# Patient Record
Sex: Female | Born: 1955 | Race: White | Hispanic: No | Smoking: Never smoker
Health system: Southern US, Community
[De-identification: ages and names within clinical notes are randomized; demographics above are authoritative.]

## PROBLEM LIST (undated history)

## (undated) DIAGNOSIS — R519 Headache, unspecified: Secondary | ICD-10-CM

## (undated) DIAGNOSIS — Z8489 Family history of other specified conditions: Secondary | ICD-10-CM

## (undated) DIAGNOSIS — K439 Ventral hernia without obstruction or gangrene: Secondary | ICD-10-CM

## (undated) DIAGNOSIS — M1990 Unspecified osteoarthritis, unspecified site: Secondary | ICD-10-CM

## (undated) DIAGNOSIS — M2550 Pain in unspecified joint: Secondary | ICD-10-CM

## (undated) DIAGNOSIS — Z9889 Other specified postprocedural states: Secondary | ICD-10-CM

## (undated) DIAGNOSIS — G4730 Sleep apnea, unspecified: Secondary | ICD-10-CM

## (undated) DIAGNOSIS — F32A Depression, unspecified: Secondary | ICD-10-CM

## (undated) DIAGNOSIS — Z803 Family history of malignant neoplasm of breast: Secondary | ICD-10-CM

## (undated) DIAGNOSIS — I10 Essential (primary) hypertension: Secondary | ICD-10-CM

## (undated) DIAGNOSIS — K219 Gastro-esophageal reflux disease without esophagitis: Secondary | ICD-10-CM

## (undated) DIAGNOSIS — F329 Major depressive disorder, single episode, unspecified: Secondary | ICD-10-CM

## (undated) DIAGNOSIS — R112 Nausea with vomiting, unspecified: Secondary | ICD-10-CM

## (undated) DIAGNOSIS — D649 Anemia, unspecified: Secondary | ICD-10-CM

## (undated) DIAGNOSIS — F419 Anxiety disorder, unspecified: Secondary | ICD-10-CM

## (undated) DIAGNOSIS — R51 Headache: Secondary | ICD-10-CM

## (undated) DIAGNOSIS — J45909 Unspecified asthma, uncomplicated: Secondary | ICD-10-CM

## (undated) DIAGNOSIS — K760 Fatty (change of) liver, not elsewhere classified: Secondary | ICD-10-CM

## (undated) DIAGNOSIS — Z91018 Allergy to other foods: Secondary | ICD-10-CM

## (undated) HISTORY — PX: VARICOSE VEIN SURGERY: SHX832

## (undated) HISTORY — PX: EYE SURGERY: SHX253

## (undated) HISTORY — DX: Unspecified asthma, uncomplicated: J45.909

## (undated) HISTORY — DX: Anxiety disorder, unspecified: F41.9

## (undated) HISTORY — PX: COLONOSCOPY W/ BIOPSIES AND POLYPECTOMY: SHX1376

## (undated) HISTORY — DX: Anemia, unspecified: D64.9

## (undated) HISTORY — DX: Depression, unspecified: F32.A

## (undated) HISTORY — DX: Family history of malignant neoplasm of breast: Z80.3

## (undated) HISTORY — DX: Allergy to other foods: Z91.018

## (undated) HISTORY — DX: Essential (primary) hypertension: I10

## (undated) HISTORY — DX: Gastro-esophageal reflux disease without esophagitis: K21.9

## (undated) HISTORY — PX: OTHER SURGICAL HISTORY: SHX169

## (undated) HISTORY — PX: DILATION AND CURETTAGE OF UTERUS: SHX78

## (undated) HISTORY — DX: Major depressive disorder, single episode, unspecified: F32.9

## (undated) HISTORY — DX: Pain in unspecified joint: M25.50

## (undated) HISTORY — PX: CHOLECYSTECTOMY: SHX55

## (undated) HISTORY — DX: Sleep apnea, unspecified: G47.30

---

## 1998-10-11 ENCOUNTER — Other Ambulatory Visit: Admission: RE | Admit: 1998-10-11 | Discharge: 1998-10-11 | Payer: Self-pay | Admitting: Obstetrics and Gynecology

## 1999-10-12 ENCOUNTER — Other Ambulatory Visit: Admission: RE | Admit: 1999-10-12 | Discharge: 1999-10-12 | Payer: Self-pay | Admitting: Obstetrics and Gynecology

## 2001-01-07 ENCOUNTER — Other Ambulatory Visit: Admission: RE | Admit: 2001-01-07 | Discharge: 2001-01-07 | Payer: Self-pay | Admitting: Obstetrics and Gynecology

## 2002-01-27 ENCOUNTER — Other Ambulatory Visit: Admission: RE | Admit: 2002-01-27 | Discharge: 2002-01-27 | Payer: Self-pay | Admitting: Obstetrics and Gynecology

## 2003-02-03 ENCOUNTER — Other Ambulatory Visit: Admission: RE | Admit: 2003-02-03 | Discharge: 2003-02-03 | Payer: Self-pay | Admitting: Obstetrics and Gynecology

## 2004-02-22 ENCOUNTER — Other Ambulatory Visit: Admission: RE | Admit: 2004-02-22 | Discharge: 2004-02-22 | Payer: Self-pay | Admitting: Obstetrics and Gynecology

## 2004-08-01 ENCOUNTER — Ambulatory Visit: Payer: Self-pay | Admitting: Internal Medicine

## 2004-08-16 ENCOUNTER — Ambulatory Visit: Payer: Self-pay | Admitting: Internal Medicine

## 2005-02-09 ENCOUNTER — Other Ambulatory Visit: Admission: RE | Admit: 2005-02-09 | Discharge: 2005-02-09 | Payer: Self-pay | Admitting: Obstetrics and Gynecology

## 2005-10-26 ENCOUNTER — Ambulatory Visit: Payer: Self-pay | Admitting: Internal Medicine

## 2006-10-26 ENCOUNTER — Ambulatory Visit: Payer: Self-pay | Admitting: Internal Medicine

## 2007-01-10 ENCOUNTER — Ambulatory Visit: Payer: Self-pay | Admitting: Otolaryngology

## 2007-08-27 ENCOUNTER — Ambulatory Visit (HOSPITAL_COMMUNITY): Admission: RE | Admit: 2007-08-27 | Discharge: 2007-08-27 | Payer: Self-pay | Admitting: Obstetrics and Gynecology

## 2007-08-27 ENCOUNTER — Encounter (INDEPENDENT_AMBULATORY_CARE_PROVIDER_SITE_OTHER): Payer: Self-pay | Admitting: Obstetrics and Gynecology

## 2007-10-23 DIAGNOSIS — J45909 Unspecified asthma, uncomplicated: Secondary | ICD-10-CM | POA: Insufficient documentation

## 2007-10-23 DIAGNOSIS — J309 Allergic rhinitis, unspecified: Secondary | ICD-10-CM | POA: Insufficient documentation

## 2007-10-23 DIAGNOSIS — Z91018 Allergy to other foods: Secondary | ICD-10-CM

## 2007-10-25 ENCOUNTER — Encounter: Payer: Self-pay | Admitting: Internal Medicine

## 2007-11-04 ENCOUNTER — Telehealth (INDEPENDENT_AMBULATORY_CARE_PROVIDER_SITE_OTHER): Payer: Self-pay | Admitting: *Deleted

## 2007-11-22 ENCOUNTER — Ambulatory Visit: Payer: Self-pay | Admitting: Internal Medicine

## 2008-07-01 ENCOUNTER — Ambulatory Visit: Payer: Self-pay

## 2009-01-21 ENCOUNTER — Ambulatory Visit (HOSPITAL_COMMUNITY): Admission: RE | Admit: 2009-01-21 | Discharge: 2009-01-21 | Payer: Self-pay | Admitting: Internal Medicine

## 2009-01-21 ENCOUNTER — Telehealth (INDEPENDENT_AMBULATORY_CARE_PROVIDER_SITE_OTHER): Payer: Self-pay | Admitting: *Deleted

## 2009-01-21 ENCOUNTER — Ambulatory Visit: Payer: Self-pay | Admitting: Internal Medicine

## 2009-04-16 ENCOUNTER — Encounter: Admission: RE | Admit: 2009-04-16 | Discharge: 2009-04-16 | Payer: Self-pay | Admitting: Specialist

## 2009-12-13 ENCOUNTER — Encounter: Admission: RE | Admit: 2009-12-13 | Discharge: 2009-12-13 | Payer: Self-pay | Admitting: Obstetrics and Gynecology

## 2010-08-14 ENCOUNTER — Encounter: Payer: Self-pay | Admitting: Obstetrics and Gynecology

## 2010-12-06 NOTE — Op Note (Signed)
NAMESERAPHIM, AFFINITO              ACCOUNT NO.:  [id number]   MEDICAL RECORD NO.:  [id number]          PATIENT TYPE:  AMB   LOCATION:  SDC                           FACILITY:  [facility]   PHYSICIAN:  Dineen Kid. Rana Snare, M.D.    DATE OF BIRTH:  February 13, 1956   DATE OF PROCEDURE:  08/27/2007  DATE OF DISCHARGE:                               OPERATIVE REPORT   PREOPERATIVE DIAGNOSIS:  Postmenopausal bleeding and endometrial mass.   POSTOPERATIVE DIAGNOSIS:  Postmenopausal bleeding and endometrial mass,  endometrial polyp.   PROCEDURE:  Hysteroscopy dilation and curettage  with polypectomy.   SURGEON:  Dr. Candice Camp.   ANESTHESIA:  General by LMA and also local by paracervical block.   INDICATIONS:  Ms. Jawad is a 55 year old postmenopausal woman by [facility].  She has had abnormal uterine bleeding.  She underwent saline infusion  ultrasound shows endometrial polyp.  She presents today for hysteroscopy  D&C for evaluation and removal of the polyp.  Risks and benefits of  procedure were discussed at length.  Informed consent was obtained.   FINDINGS AT TIME OF SURGERY:  Small endometrial polyp.  Otherwise normal-  appearing endometrial cavity and cervix.   DESCRIPTION OF PROCEDURE:  After adequate analgesia the patient placed  in the dorsal lithotomy position.  She is sterilely prepped and draped.  Bladder sterilely drained.  Graves speculum placed.  Tenaculum placed on  anterior lip of the cervix and paracervical block was placed 1%  Xylocaine with 1:100,000 epinephrine.  Uterus sounded to 8 cm, easily  dilated to #27 [facility] dilator.  Hysteroscope was inserted.  The above  findings were noted.  Polyp forceps were used to grasp the endometrial  polyp and remove, followed by sharp curettage until a gritty surface  felt throughout the endometrial cavity.  Reexamination with the  hysteroscope revealed normal appearing endometrial lining in ostia and  cervix without any residual polyps noted or other  underlying pathology  visualized.  Hysteroscope was then removed.  Tenaculum removed from the  cervix noted be hemostatic.  The patient was then transferred to  recovery room in stable condition.  Sponge instrument count was normal  x3.  Estimated blood loss minimal.  Sorbitol deficit 10 mL.  The patient  received 1 gram of cefotetan preoperatively.   DISPOSITION:  The patient discharged home to follow-up the office in 2-3  weeks, sent home with routine instruction sheet for D&C, told to return  for increased pain, fever or bleeding.      Dineen Kid Rana Snare, M.D.  Electronically Signed    DCL/MEDQ  D:  08/27/2007  T:  08/27/2007  Job:  [id number]

## 2010-12-09 NOTE — Assessment & Plan Note (Signed)
[facility]                             PULMONARY OFFICE NOTE   NAME:Hardin, Mary                       MRN:          [id number]  DATE:10/26/2006                            DOB:          [birth year]    PROBLEM:  1. Chronic asthma.  2. Allergic rhinitis.  3. 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   HISTORY:  One-year followup.  She uses Flonase and Allegra when needed.  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  She has not been wearing a mask.  We discussed this, and  talked about risk management.  I also went over use of an EpiPen with  her, and updated her prescription.  She questioned whether Advair might  keep her hoarse, and we discussed dry powder and steroid technologies.  I offered a trial of Symbicort as an alternative, since that could be  used with a spacer, but she decided to stick with the familiar product.   MEDICATIONS:  1. Advair 100/50.  2. Flonase.  3. Allegra 180.  4. Rescue albuterol inhaler, rarely needed.  5. Valtrex.  6. EpiPen.   OBJECTIVE:  Weight 305 pounds.  BP 124/82.  Pulse regular 72.  Room air  saturation 100%.  She is significantly overweight, alert, and seems comfortable today.  Conjunctivae, nasal mucosa, and chest were all clear.  HEART:  Sounds regular without murmur.   IMPRESSION:  1. Seasonal rhinitis.  2. Nonspecific dust irritant exposure.  3. Mild asthma.  4. Angioedema has not been active as long as she trigging foods.   PLAN:  1. We refilled EpiPen.  Refilled her routine medications including      Advair 100/50 and rescue      albuterol.  2. Schedule return in 1 year, earlier p.r.n.     Clinton D. Maple Hudson, MD, Tonny Bollman, FACP  Electronically Signed    CDY/MedQ  DD: 10/26/2006  DT: 10/27/2006  Job #: [id number]   cc:   Dineen Kid. Rana Snare, M.D.

## 2011-03-14 ENCOUNTER — Ambulatory Visit: Payer: Self-pay | Admitting: Family Medicine

## 2011-04-14 LAB — CBC
Hemoglobin: 12.2
RDW: 14.2

## 2012-07-04 ENCOUNTER — Other Ambulatory Visit: Payer: Self-pay | Admitting: Gastroenterology

## 2012-07-24 LAB — HM COLONOSCOPY

## 2013-02-26 ENCOUNTER — Telehealth: Payer: Self-pay | Admitting: Genetic Counselor

## 2013-02-26 NOTE — Telephone Encounter (Signed)
LVOM FOR PT TO RETURN CALL IN RE TO GENETIC APPT.  °

## 2013-02-27 ENCOUNTER — Telehealth: Payer: Self-pay | Admitting: Genetic Counselor

## 2013-02-27 NOTE — Telephone Encounter (Signed)
S/W PT IN RE TO GENETIC APPT 10/09 @ 10 W/KAREN POWELL REFERRING DR. Onalee Hua LOWE WELCOME PACKET MAILED.

## 2013-05-01 ENCOUNTER — Encounter: Payer: Self-pay | Admitting: Genetic Counselor

## 2013-05-01 ENCOUNTER — Other Ambulatory Visit: Payer: Self-pay | Admitting: Lab

## 2013-07-28 ENCOUNTER — Encounter: Payer: Self-pay | Admitting: Genetic Counselor

## 2013-07-28 ENCOUNTER — Other Ambulatory Visit: Payer: Self-pay | Admitting: Lab

## 2013-07-31 ENCOUNTER — Other Ambulatory Visit: Payer: Self-pay

## 2013-07-31 ENCOUNTER — Encounter: Payer: Self-pay | Admitting: Genetic Counselor

## 2013-08-12 DIAGNOSIS — G4730 Sleep apnea, unspecified: Secondary | ICD-10-CM | POA: Insufficient documentation

## 2013-08-12 DIAGNOSIS — K219 Gastro-esophageal reflux disease without esophagitis: Secondary | ICD-10-CM | POA: Insufficient documentation

## 2013-10-02 ENCOUNTER — Encounter: Payer: Self-pay | Admitting: Genetic Counselor

## 2013-10-02 ENCOUNTER — Ambulatory Visit (HOSPITAL_BASED_OUTPATIENT_CLINIC_OR_DEPARTMENT_OTHER): Payer: PRIVATE HEALTH INSURANCE | Admitting: Genetic Counselor

## 2013-10-02 ENCOUNTER — Other Ambulatory Visit: Payer: Self-pay

## 2013-10-02 DIAGNOSIS — IMO0002 Reserved for concepts with insufficient information to code with codable children: Secondary | ICD-10-CM

## 2013-10-02 DIAGNOSIS — Z808 Family history of malignant neoplasm of other organs or systems: Secondary | ICD-10-CM

## 2013-10-02 DIAGNOSIS — Z800 Family history of malignant neoplasm of digestive organs: Secondary | ICD-10-CM

## 2013-10-02 DIAGNOSIS — Z803 Family history of malignant neoplasm of breast: Secondary | ICD-10-CM

## 2013-10-02 NOTE — Progress Notes (Signed)
Dr.  Rana Snare, Dineen Kid, MD requested a consultation for genetic counseling and risk assessment for Mary Hardin, a 58 y.o. female, for discussion of her family history of breast, pancreatic and thyroid cancer.  She presents to clinic today to discuss the possibility of a genetic predisposition to cancer, and to further clarify her risks, as well as her family members' risks for cancer.   HISTORY OF PRESENT ILLNESS: Mary Hardin is a 58 y.o. female with no personal history of cancer.  She has had abnormal bleeding which has prompted her to have two D&C's, but has not been diagnosed with cancer.  She has had a colonoscopy in the past that was normal, an endoscopy which found gastritis, and has been diagnosed with fibrocystic breast disease.  History reviewed. No pertinent past medical history.  History reviewed. No pertinent past surgical history.  History   Social History  . Marital Status: Married    Spouse Name: N/A    Number of Children: 0  . Years of Education: N/A   Social History Main Topics  . Smoking status: Never Smoker   . Smokeless tobacco: None  . Alcohol Use: Yes     Comment: 1-4 glasses per week  . Drug Use: No  . Sexual Activity: None   Other Topics Concern  . None   Social History Narrative  . None    REPRODUCTIVE HISTORY AND PERSONAL RISK ASSESSMENT FACTORS: Menarche was at age 59.   postmenopausal Uterus Intact: yes Ovaries Intact: yes G0P0A0, first live birth at age N/A  She has not previously undergone treatment for infertility.   Oral Contraceptive use: 25+ years   She has not used HRT in the past.    FAMILY HISTORY:  We obtained a detailed, 4-generation family history.  Significant diagnoses are listed below: Family History  Problem Relation Age of Onset  . Breast cancer Maternal Grandmother 51  . Pancreatic cancer Paternal Grandmother 50  . Breast cancer Maternal Aunt 55  . Thyroid cancer Cousin     dx in her 30s    Patient's ancestors  are of unknown descent. There is no reported Ashkenazi Jewish ancestry. There is no known consanguinity.  GENETIC COUNSELING ASSESSMENT: Mary Hardin is a 58 y.o. female with a family history of breast, pancreatic and thyroid cancer which somewhat suggestive of a sporadic or familial pattern of cancer. We, therefore, discussed and recommended the following at today's visit.   DISCUSSION: We reviewed the characteristics, features and inheritance patterns of hereditary cancer syndromes. We also discussed genetic testing, including the appropriate family members to test, the process of testing, insurance coverage and turn-around-time for results. We discussed that based on her large family, and few family members with cancer over the age of 44, that the pattern in her family is more consistent with familial breast cancer rather than hereditary breast cancer.  Familial cancer is associated with older ages of onset and has a multifactorial pattern of inheritance, including genetics and environmental exposures.  At this time she does not meet the medical criteria for genetic testing based on [employer] criteria.  PLAN: After considering the risks, benefits, and limitations, Mary Hardin declined genetic testing based on not meeting the medical criteria for her insurance. We encouraged Mary Hardin to remain in contact with cancer genetics annually so that we can continuously update the family history and inform her of any changes in cancer genetics and testing that may be of benefit for her family. Mary Hardin's questions were answered  to her satisfaction today. Our contact information was provided should additional questions or concerns arise.  The patient was seen for a total of 45 minutes, greater than 50% of which was spent face-to-face counseling.  This note will also be sent to the referring provider via the electronic medical record. The patient will be supplied with a summary of this genetic  counseling discussion as well as educational information on the discussed hereditary cancer syndromes following the conclusion of their visit.   Patient was discussed with Dr. Drue SecondKalsoom Khan.   _______________________________________________________________________ For Office Staff:  Number of people involved in session: 1 Was an Intern/ student involved with case: no

## 2013-11-24 ENCOUNTER — Other Ambulatory Visit: Payer: Self-pay | Admitting: *Deleted

## 2013-11-24 ENCOUNTER — Telehealth: Payer: Self-pay | Admitting: *Deleted

## 2013-11-24 DIAGNOSIS — G5600 Carpal tunnel syndrome, unspecified upper limb: Secondary | ICD-10-CM

## 2013-11-24 NOTE — Telephone Encounter (Signed)
Office notes and referral faxed to Dr. Doonquah. Awaiting appointment. 

## 2013-12-03 NOTE — Telephone Encounter (Signed)
Patient has an appointment for 12/10/13 at 12:00 pm with Dr. Gerilyn Pilgrimoonquah. Patient aware.

## 2014-02-09 ENCOUNTER — Telehealth: Payer: Self-pay | Admitting: Genetic Counselor

## 2014-02-09 NOTE — Telephone Encounter (Signed)
pt called requesting a f/u genetics appt due to new inbformation on family history. s/w catherine and per catherine pt may not have to come in she can s/w pt over the phone. s/w pt and she is ok w/speaking to catherine over the phone. information given to catherine and if an appt  is needed catherine will let me know.

## 2014-02-14 ENCOUNTER — Telehealth: Payer: Self-pay | Admitting: Genetic Counselor

## 2014-02-14 NOTE — Telephone Encounter (Signed)
SCHEDULED PT FOR GENETICS APPT PER CATHERINE (OUTLOOK). S/W PT SHE IS AWARE OF APPTS FOR 8/6. DATE PER PT.

## 2014-02-26 ENCOUNTER — Ambulatory Visit (HOSPITAL_BASED_OUTPATIENT_CLINIC_OR_DEPARTMENT_OTHER): Payer: PRIVATE HEALTH INSURANCE | Admitting: Genetic Counselor

## 2014-02-26 ENCOUNTER — Encounter: Payer: Self-pay | Admitting: Genetic Counselor

## 2014-02-26 ENCOUNTER — Other Ambulatory Visit: Payer: PRIVATE HEALTH INSURANCE

## 2014-02-26 ENCOUNTER — Encounter (INDEPENDENT_AMBULATORY_CARE_PROVIDER_SITE_OTHER): Payer: Self-pay

## 2014-02-26 DIAGNOSIS — Z803 Family history of malignant neoplasm of breast: Secondary | ICD-10-CM | POA: Insufficient documentation

## 2014-02-26 DIAGNOSIS — IMO0002 Reserved for concepts with insufficient information to code with codable children: Secondary | ICD-10-CM

## 2014-02-26 NOTE — Progress Notes (Signed)
Patient Name: Mary Hardin Patient Age: 58 y.o. Encounter Date: 02/26/2014  Referring Physician: Candice CampLOWE, DAVID, MD  Primary Care Provider: Vonita MossRISSMAN,MARK, MD   Mary Hardin, a 58 y.o. female, is being [facility] due to a family history of breast cancer.  She presents to clinic today to discuss the possibility of a hereditary predisposition to cancer and discuss whether genetic testing is warranted. She was seen by Maylon CosKaren Powell on 10/02/13, but testing was not recommended at that time. She returns today due to additional family history.  HISTORY OF PRESENT ILLNESS: Mary Hardin has no personal history of cancer. She reports having a history of abnormal Paps and 2 D/Cs. She stated that she has a yearly mammogram, clinical breast exam and gynecologic exam. Colonoscopic screening at 51 and 56 were negative for polyps, per her report.  Past Medical History  Diagnosis Date  . Family history of malignant neoplasm of breast     History   Social History  . Marital Status: Married    Spouse Name: N/A    Number of Children: 0  . Years of Education: N/A   Social History Main Topics  . Smoking status: Never Smoker   . Smokeless tobacco: Not on file  . Alcohol Use: Yes     Comment: 1-4 glasses per week  . Drug Use: No  . Sexual Activity: Not on file   Other Topics Concern  . Not on file   Social History Narrative  . No narrative on file     FAMILY HISTORY:   During the visit, a 4-generation pedigree was obtained. Significant diagnoses include the following:  Family History  Problem Relation Age of Onset  . Breast cancer Maternal Grandmother 4958    deceased 4963  . Pancreatic cancer Paternal Grandmother 5660  . Breast cancer Maternal Aunt 55    currently 6462  . Thyroid cancer Cousin     dx in her 2940s  . Breast cancer Mother 4678    DCIS; currently 6578    Additionally, Mary Hardin has no children. Her brother (age 57) is cancer-free as are his son and  daughter. Her father died of a stroke/PE at age 57. There are no cancers reported in his six brothers or two sisters or any of their children.  Mary Hardin's ancestry is Caucasian - NOS. There is no known Jewish ancestry and no consanguinity.  ASSESSMENT AND PLAN: Mary Hardin is a 58 y.o. female with a family history of breast cancer in her mother at 6378, one of her 4 maternal aunts at 6055 and her maternal grandmother at 3858. Given her mother's age at diagnosis, this history is not highly suggestive of a hereditary predisposition to cancer. We reviewed the characteristics, features and inheritance patterns of hereditary cancer syndromes. Mary Hardin was highly motivated to get tested in order to define her own breast cancer risk more clearly. We discussed genetic testing, including the appropriate family members to test, the process of testing, insurance coverage and implications of results. A negative result will be overall reassuring, but she understood that she would still have an elevated risk of breast cancer due to her family history.  Mary Hardin wished to pursue genetic testing and a blood sample will be sent to [facility] for analysis of the 17 genes on the BreastNext gene panel. We discussed the implications of a positive, negative and/ or Variant of Uncertain Significance (VUS) result. Results should be available in approximately 4-5  weeks, at which point we will contact her and address implications for her as well as address genetic testing for at-risk family members, if needed.    We encouraged Ms. Sima to remain in contact with Cancer Genetics annually so that we can update the family history and inform her of any changes in cancer genetics and testing that may be of benefit for this family. Ms.  Mun questions were answered to her satisfaction today.   Thank you for the referral and allowing [place] to share in the care of your patient.   The patient was seen for a total of 35 minutes,  greater than 50% of which was spent face-to-face counseling. This patient was discussed with the overseeing provider who agrees with the above.   Elmer Picker, MS, CGC Certified Genetic Counseor phone: [phone] [email or web address]

## 2014-03-17 ENCOUNTER — Encounter: Payer: Self-pay | Admitting: Genetic Counselor

## 2014-03-17 NOTE — Progress Notes (Signed)
Referring Physician: Louretta Shorten, MD   [name] was called today to discuss genetic test results. Please see the Genetics note from her visit on 02/26/14 for a detailed discussion of her personal and family history.  GENETIC TESTING: At the time of [name] visit, we recommended she pursue genetic testing of multiple genes on the BreastNext gene panel. This test, which included sequencing and deletion/duplication analysis of 17 genes, was performed at [facility]. Testing was normal and did not reveal a mutation in these genes. The genes tested were ATM, BARD1, BRCA1, BRCA2, BRIP1, CDH1, CHEK2, MRE11A, MUTYH, NBN, NF1, PALB2, PTEN, RAD50, RAD51C, RAD51D, and TP53.  We discussed with [name] that since the current test is not perfect, it is possible there may be a gene mutation that current testing cannot detect, but that chance is small. We also discussed that it is possible that a different genetic factor, which was not part of this testing or has not yet been discovered, is responsible for the cancer diagnoses in the family.   CANCER SCREENING: This normal result is reassuring and indicates that [name] does not likely have an increased risk of cancer due to a mutation in one of these genes. She is aware that her risk is still elevated above baseline due to her family history. We recommended [name] continue to follow the cancer screening guidelines provided by her primary physician.   FAMILY MEMBERS: Women in the family are at some increased risk of developing breast cancer, over the general population risk, simply due to the family history. We recommended they have a yearly mammogram beginning at age 4, a yearly clinical breast exam, and perform monthly breast self-exams. A gynecologic exam is recommended yearly. Colon cancer screening is recommended to begin by age 21.  Lastly, we discussed with [name] that cancer genetics is a rapidly advancing field and it is possible  that new genetic tests will be appropriate for her in the future. We encouraged her to remain in contact with [place] on an annual basis so we can update her personal and family histories, and let her know of advances in cancer genetics that may benefit the family. Our contact number was provided. [name] questions were answered to her satisfaction today, and she knows she is welcome to call anytime with additional questions.    Mary Berg, MS, [place] Certified Genetic Counseor phone: [phone] [email or web address]

## 2014-07-08 ENCOUNTER — Other Ambulatory Visit: Payer: Self-pay | Admitting: Obstetrics and Gynecology

## 2014-07-13 LAB — CYTOLOGY - PAP

## 2014-08-03 ENCOUNTER — Encounter (INDEPENDENT_AMBULATORY_CARE_PROVIDER_SITE_OTHER): Payer: Self-pay

## 2014-08-03 ENCOUNTER — Encounter: Payer: Self-pay | Admitting: Cardiovascular Disease

## 2014-08-03 ENCOUNTER — Ambulatory Visit (INDEPENDENT_AMBULATORY_CARE_PROVIDER_SITE_OTHER): Payer: PRIVATE HEALTH INSURANCE | Admitting: Cardiovascular Disease

## 2014-08-03 VITALS — BP 128/80 | HR 67 | Ht 68.0 in | Wt 341.1 lb

## 2014-08-03 DIAGNOSIS — R000 Tachycardia, unspecified: Secondary | ICD-10-CM

## 2014-08-03 DIAGNOSIS — R0789 Other chest pain: Secondary | ICD-10-CM

## 2014-08-03 DIAGNOSIS — R079 Chest pain, unspecified: Secondary | ICD-10-CM | POA: Insufficient documentation

## 2014-08-03 NOTE — Progress Notes (Signed)
Mary Hardin Date of Birth  10-25-1955       [facility] [facility]    [place] [facility] [address], [address]  [address] [place], [place]  [address]   [place]  [id number] [phone]     [phone]   Fax  [phone]     Fax [phone]  Problem List: 1. Atypical chest pain 2. Morbid obesity 3. GERD 4. Obstructive sleep apnea - uses CPAP   History of Present Illness:  Mary Hardin is a 59 yo with hx of morbid obesity.  She reported having episodes of CP to her medical doctor. She has tachycardia whenever she walks or goes up stairs. She then developes some chest pain with the tachycardia.  Pains just last a split second  She pains are a "pins and needles" or stabbing like pain.  Also has GERD symptoms. Has DOE but never dyspnea at rest.  No PND or orthopnea Wears her CPAP at night for her OSA.   Does not work outside the house. Just joined the gym. 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      Current Outpatient Prescriptions  Medication Sig Dispense Refill  . escitalopram (LEXAPRO) 10 MG tablet Take 10 mg by mouth daily.    . Fluticasone-Salmeterol (ADVAIR DISKUS) 100-50 MCG/DOSE AEPB Inhale into the lungs as needed.    . pantoprazole (PROTONIX) 40 MG tablet Take 40 mg by mouth as needed.     No current facility-administered medications for this visit.      Allergies  Allergen Reactions  . Shellfish-Derived Products     swelling    Past Medical History  Diagnosis Date  . Family history of malignant neoplasm of breast     No past surgical history on file.  History  Smoking status  . Never Smoker   Smokeless tobacco  . Not on file    History  Alcohol Use  . Yes    Comment: 1-4 glasses per week    Family History  Problem Relation Age of Onset  . Breast cancer Maternal Grandmother 3458    deceased 6363  . Pancreatic cancer Paternal Grandmother 5160  . Breast cancer Maternal Aunt 55    currently  6762  . Thyroid cancer Cousin     dx in her 7440s  . Breast cancer Mother 878    DCIS; currently 5178    Reviw of Systems:  Reviewed in the HPI.  All other systems are negative.  Physical Exam: Blood pressure 128/80, pulse 67, height 5\' 8"  (1.727 m), weight 341 lb 1.9 oz (154.731 kg). Wt Readings from Last 3 Encounters:  08/03/14 341 lb 1.9 oz (154.731 kg)  01/21/09 329 lb 4 oz (149.347 kg)  11/22/07 324 lb (146.965 kg)     General: Well developed, well nourished, in no acute distress.  Head: Normocephalic, atraumatic, sclera non-icteric, mucus membranes are moist,   Neck: Supple. Carotids are 2 + without bruits. No JVD  Lungs: Clear   Heart: RR, normal S1S2  Abdomen: Soft, non-tender, non-distended with normal bowel sounds.  Msk:  Strength and tone are normal   Extremities: No clubbing or cyanosis. No edema.  Distal pedal pulses are 2+ and equal    Neuro: CN II - XII intact.  Alert and oriented X 3.   Psych:  Normal   ECG: Jan. 11, 2016:  NSR at 2567.  1st degree AV block .   Assessment / Plan:

## 2014-08-03 NOTE — Patient Instructions (Signed)
Your physician recommends that you schedule a follow-up appointment in:  As needed  Your physician recommends that you continue on your current medications as directed. Please refer to the Current Medication list given to you today.  

## 2014-08-03 NOTE — Assessment & Plan Note (Signed)
Mary Hardin presents with exertional palpitations and very atypical CP. Her symptoms do not sound like angina. I suspect these are all due to her morbid obesity. We talked quite a bit about her size and the importance of weight loss.   I do not think she needs to see me regularly but I would be happy to see her on an as needed basis.

## 2015-03-01 ENCOUNTER — Ambulatory Visit: Payer: Self-pay | Admitting: Family Medicine

## 2015-05-04 ENCOUNTER — Ambulatory Visit: Payer: Self-pay | Admitting: Family Medicine

## 2015-05-06 ENCOUNTER — Ambulatory Visit: Payer: Self-pay | Admitting: Family Medicine

## 2015-05-10 DIAGNOSIS — G4733 Obstructive sleep apnea (adult) (pediatric): Secondary | ICD-10-CM | POA: Insufficient documentation

## 2015-05-10 DIAGNOSIS — F32A Depression, unspecified: Secondary | ICD-10-CM

## 2015-05-10 DIAGNOSIS — F329 Major depressive disorder, single episode, unspecified: Secondary | ICD-10-CM

## 2015-05-10 DIAGNOSIS — E669 Obesity, unspecified: Secondary | ICD-10-CM

## 2015-05-10 DIAGNOSIS — F339 Major depressive disorder, recurrent, unspecified: Secondary | ICD-10-CM | POA: Insufficient documentation

## 2015-05-10 DIAGNOSIS — Z6841 Body Mass Index (BMI) 40.0 and over, adult: Secondary | ICD-10-CM | POA: Insufficient documentation

## 2015-05-10 DIAGNOSIS — I10 Essential (primary) hypertension: Secondary | ICD-10-CM | POA: Insufficient documentation

## 2015-05-11 ENCOUNTER — Encounter: Payer: Self-pay | Admitting: Family Medicine

## 2015-05-11 ENCOUNTER — Ambulatory Visit (INDEPENDENT_AMBULATORY_CARE_PROVIDER_SITE_OTHER): Payer: PRIVATE HEALTH INSURANCE | Admitting: Family Medicine

## 2015-05-11 VITALS — BP 137/81 | HR 64 | Temp 98.6°F | Ht 67.3 in | Wt 348.0 lb

## 2015-05-11 DIAGNOSIS — K219 Gastro-esophageal reflux disease without esophagitis: Secondary | ICD-10-CM | POA: Diagnosis not present

## 2015-05-11 DIAGNOSIS — R0789 Other chest pain: Secondary | ICD-10-CM | POA: Diagnosis not present

## 2015-05-11 DIAGNOSIS — F329 Major depressive disorder, single episode, unspecified: Secondary | ICD-10-CM

## 2015-05-11 DIAGNOSIS — F419 Anxiety disorder, unspecified: Secondary | ICD-10-CM

## 2015-05-11 DIAGNOSIS — Z23 Encounter for immunization: Secondary | ICD-10-CM | POA: Diagnosis not present

## 2015-05-11 DIAGNOSIS — F32A Depression, unspecified: Secondary | ICD-10-CM

## 2015-05-11 DIAGNOSIS — G4730 Sleep apnea, unspecified: Secondary | ICD-10-CM

## 2015-05-11 MED ORDER — LORAZEPAM 1 MG PO TABS: 1.0000 mg | ORAL_TABLET | Freq: Every day | ORAL | Status: DC | PRN

## 2015-05-11 MED ORDER — ESCITALOPRAM OXALATE 20 MG PO TABS: 20.0000 mg | ORAL_TABLET | Freq: Every day | ORAL | Status: DC

## 2015-05-11 NOTE — Assessment & Plan Note (Signed)
Controlled with diet 

## 2015-05-11 NOTE — Assessment & Plan Note (Signed)
The current medical regimen is effective;  continue present plan and medications.  

## 2015-05-11 NOTE — Assessment & Plan Note (Signed)
Per anxiety will give lorazepam 1 mg to take 1/2-1 tablet when necessary anxiety patient should last a long time.

## 2015-05-11 NOTE — Progress Notes (Signed)
BP 137/81 mmHg  Pulse 64  Temp(Src) 98.6 F (37 C)  Ht 5' 7.3" (1.709 m)  Wt 348 lb (157.852 kg)  BMI 54.05 kg/m2  SpO2 98%   Subjective:    Patient ID: Mary Hardin, female    DOB: 1955-12-18, 59 y.o.   MRN: [id number]  HPI: Mary Hardin is a 59 y.o. female  Chief Complaint  Patient presents with  . Hypertension  . Depression   Patient follow-up on depression doing well with Lexapro effects and wants to continue as doing very well. Patient also has had occasional panic attacks has some old lorazepam from 2013 Wants a refill to have for rare use.  Blood pressure doing well not taking any medicine Not taking any reflux medicines   Relevant past medical, surgical, family and social history reviewed and updated as indicated. Interim medical history since our last visit reviewed. Allergies and medications reviewed and updated.  Review of Systems  Constitutional: Negative.   Respiratory: Negative.   Cardiovascular: Negative.     Per HPI unless specifically indicated above     Objective:    BP 137/81 mmHg  Pulse 64  Temp(Src) 98.6 F (37 C)  Ht 5' 7.3" (1.709 m)  Wt 348 lb (157.852 kg)  BMI 54.05 kg/m2  SpO2 98%  Wt Readings from Last 3 Encounters:  05/11/15 348 lb (157.852 kg)  11/02/14 337 lb (152.862 kg)  08/03/14 341 lb 1.9 oz (154.731 kg)    Physical Exam  Constitutional: She is oriented to person, place, and time. She appears well-developed and well-nourished. No distress.  HENT:  Head: Normocephalic and atraumatic.  Right Ear: Hearing normal.  Left Ear: Hearing normal.  Nose: Nose normal.  Eyes: Conjunctivae and lids are normal. Right eye exhibits no discharge. Left eye exhibits no discharge. No scleral icterus.  Cardiovascular: Normal rate, regular rhythm and normal heart sounds.   Pulmonary/Chest: Effort normal and breath sounds normal. No respiratory distress.  Musculoskeletal: Normal range of motion.  Neurological: She is alert and  oriented to person, place, and time.  Skin: Skin is intact. No rash noted.  Psychiatric: She has a normal mood and affect. Her speech is normal and behavior is normal. Judgment and thought content normal. Cognition and memory are normal.    Results for orders placed or performed in visit on 05/10/15  HM COLONOSCOPY  Result Value Ref Range   HM Colonoscopy from [facility]       Assessment & Plan:   Problem List Items Addressed This Visit      Digestive   Acid reflux    Controlled with diet        Other   Chest pain   Relevant Medications   escitalopram (LEXAPRO) 20 MG tablet   Apnea, sleep    Using CPAP faithfully      Chronic depression    The current medical regimen is effective;  continue present plan and medications.       Relevant Medications   LORazepam (ATIVAN) 1 MG tablet   escitalopram (LEXAPRO) 20 MG tablet   Anxiety    Per anxiety will give lorazepam 1 mg to take 1/2-1 tablet when necessary anxiety patient should last a long time.      Relevant Medications   LORazepam (ATIVAN) 1 MG tablet   escitalopram (LEXAPRO) 20 MG tablet    Other Visit Diagnoses    Immunization due    -  Primary    Relevant Orders  Flu Vaccine QUAD 36+ mos PF IM (Fluarix & Fluzone Quad PF) (Completed)    Tdap vaccine greater than or equal to 7yo IM (Completed)        Follow up plan: Return in about 6 months (around 11/09/2015), or if symptoms worsen or fail to improve, for Physical Exam.

## 2015-05-11 NOTE — Assessment & Plan Note (Signed)
Using CPAP faithfully

## 2015-11-09 ENCOUNTER — Encounter: Payer: Self-pay | Admitting: Family Medicine

## 2015-11-09 ENCOUNTER — Ambulatory Visit (INDEPENDENT_AMBULATORY_CARE_PROVIDER_SITE_OTHER): Payer: PRIVATE HEALTH INSURANCE | Admitting: Family Medicine

## 2015-11-09 VITALS — BP 124/80 | HR 58 | Temp 98.3°F | Ht 68.3 in | Wt 349.0 lb

## 2015-11-09 DIAGNOSIS — Z0000 Encounter for general adult medical examination without abnormal findings: Secondary | ICD-10-CM

## 2015-11-09 DIAGNOSIS — G4733 Obstructive sleep apnea (adult) (pediatric): Secondary | ICD-10-CM | POA: Diagnosis not present

## 2015-11-09 DIAGNOSIS — F329 Major depressive disorder, single episode, unspecified: Secondary | ICD-10-CM | POA: Diagnosis not present

## 2015-11-09 DIAGNOSIS — E669 Obesity, unspecified: Secondary | ICD-10-CM | POA: Diagnosis not present

## 2015-11-09 DIAGNOSIS — F32A Depression, unspecified: Secondary | ICD-10-CM

## 2015-11-09 DIAGNOSIS — F419 Anxiety disorder, unspecified: Secondary | ICD-10-CM

## 2015-11-09 DIAGNOSIS — J4520 Mild intermittent asthma, uncomplicated: Secondary | ICD-10-CM

## 2015-11-09 DIAGNOSIS — Z113 Encounter for screening for infections with a predominantly sexual mode of transmission: Secondary | ICD-10-CM

## 2015-11-09 DIAGNOSIS — K219 Gastro-esophageal reflux disease without esophagitis: Secondary | ICD-10-CM

## 2015-11-09 LAB — URINALYSIS, ROUTINE W REFLEX MICROSCOPIC
Bilirubin, UA: NEGATIVE
GLUCOSE, UA: NEGATIVE
KETONES UA: NEGATIVE
LEUKOCYTES UA: NEGATIVE
Nitrite, UA: NEGATIVE
Protein, UA: NEGATIVE
RBC, UA: NEGATIVE
Specific Gravity, UA: 1.01 (ref 1.005–1.030)
UUROB: 0.2 mg/dL (ref 0.2–1.0)
pH, UA: 7.5 (ref 5.0–7.5)

## 2015-11-09 NOTE — Assessment & Plan Note (Signed)
The current medical regimen is effective;  continue present plan and medications.  

## 2015-11-09 NOTE — Assessment & Plan Note (Signed)
discussed diet exercise wt loss

## 2015-11-09 NOTE — Assessment & Plan Note (Signed)
Uses occ lorezapam

## 2015-11-09 NOTE — Progress Notes (Signed)
BP 124/80 mmHg  Pulse 58  Temp(Src) 98.3 F (36.8 C)  Ht 5' 8.3" (1.735 m)  Wt 349 lb (158.305 kg)  BMI 52.59 kg/m2  SpO2 96%   Subjective:    Patient ID: Mary Hardin, female    DOB: 10-25-1955, 60 y.o.   MRN: [id number]  HPI: Mary Hardin is a 60 y.o. female  Chief Complaint  Patient presents with  . Annual Exam  Patient all in all doing well. Taking Lexapro mostly 10 mg taken without problems takes rare lorazepam when flying or for stormy days still has original bottle has not needed Advair or Flonase but has on standby. Really does well with her CPAP machine which enables good sleep and keep her congestion clear. Patient has continued problems with weight.  Relevant past medical, surgical, family and social history reviewed and updated as indicated. Interim medical history since our last visit reviewed. Allergies and medications reviewed and updated.  Review of Systems  Constitutional: Negative.   HENT: Negative.   Eyes: Negative.   Respiratory: Negative.   Cardiovascular: Negative.   Gastrointestinal: Negative.   Endocrine: Negative.   Genitourinary: Negative.   Musculoskeletal: Negative.   Skin: Negative.   Allergic/Immunologic: Negative.   Neurological: Negative.   Hematological: Negative.   Psychiatric/Behavioral: Negative.     Per HPI unless specifically indicated above     Objective:    BP 124/80 mmHg  Pulse 58  Temp(Src) 98.3 F (36.8 C)  Ht 5' 8.3" (1.735 m)  Wt 349 lb (158.305 kg)  BMI 52.59 kg/m2  SpO2 96%  Wt Readings from Last 3 Encounters:  11/09/15 349 lb (158.305 kg)  05/11/15 348 lb (157.852 kg)  11/02/14 337 lb (152.862 kg)    Physical Exam  Constitutional: She is oriented to person, place, and time. She appears well-developed and well-nourished.  HENT:  Head: Normocephalic and atraumatic.  Right Ear: External ear normal.  Left Ear: External ear normal.  Nose: Nose normal.  Mouth/Throat: Oropharynx is clear and moist.   Eyes: Conjunctivae and EOM are normal. Pupils are equal, round, and reactive to light.  Neck: Normal range of motion. Neck supple. Carotid bruit is not present.  Cardiovascular: Normal rate, regular rhythm and normal heart sounds.   No murmur heard. Pulmonary/Chest: Effort normal and breath sounds normal.  Breast exam done at GYN and normal  Abdominal: Soft. Bowel sounds are normal. There is no hepatosplenomegaly.  Genitourinary:  Pap smear done at GYN and reported as normal.  Musculoskeletal: Normal range of motion.  Neurological: She is alert and oriented to person, place, and time.  Skin: No rash noted.  Psychiatric: She has a normal mood and affect. Her behavior is normal. Judgment and thought content normal.    Results for orders placed or performed in visit on 05/10/15  HM COLONOSCOPY  Result Value Ref Range   HM Colonoscopy from [facility]       Assessment & Plan:   Problem List Items Addressed This Visit      Respiratory   Asthma    Stable with PRN meds      OSA (obstructive sleep apnea)    Controlled with using CPAP nightly        Digestive   Acid reflux    Discussed PRN meds        Other   Chronic depression    The current medical regimen is effective;  continue present plan and medications.       Obesity  discussed diet exercise wt loss      Anxiety    Uses occ lorezapam       Other Visit Diagnoses    Routine general medical examination at a health care facility    -  Primary    Relevant Orders    CBC with Differential/Platelet    Comprehensive metabolic panel    Lipid Panel w/o Chol/HDL Ratio    TSH    Urinalysis, Routine w reflex microscopic ([facility])    Routine screening for STI (sexually transmitted infection)        Relevant Orders    Hepatitis C Antibody        Follow up plan: Return in about 6 months (around 05/10/2016) for med check.

## 2015-11-09 NOTE — Assessment & Plan Note (Signed)
Controlled with using CPAP nightly

## 2015-11-09 NOTE — Assessment & Plan Note (Signed)
Discussed PRN meds

## 2015-11-09 NOTE — Assessment & Plan Note (Signed)
Stable with PRN meds

## 2015-11-09 NOTE — Addendum Note (Signed)
Addended by: Lurlean HornsWILSON, NANCY H on: 11/09/2015 09:26 AM   Modules accepted: [employer]

## 2015-11-10 ENCOUNTER — Encounter: Payer: Self-pay | Admitting: Family Medicine

## 2015-11-10 LAB — CBC WITH DIFFERENTIAL/PLATELET
BASOS: 1 %
Basophils Absolute: 0 10*3/uL (ref 0.0–0.2)
EOS (ABSOLUTE): 0.2 10*3/uL (ref 0.0–0.4)
EOS: 2 %
HEMATOCRIT: 34.5 % (ref 34.0–46.6)
Hemoglobin: 11.1 g/dL (ref 11.1–15.9)
Immature Grans (Abs): 0 10*3/uL (ref 0.0–0.1)
Immature Granulocytes: 0 %
LYMPHS ABS: 1.7 10*3/uL (ref 0.7–3.1)
Lymphs: 26 %
MCH: 26.7 pg (ref 26.6–33.0)
MCHC: 32.2 g/dL (ref 31.5–35.7)
MCV: 83 fL (ref 79–97)
MONOS ABS: 0.4 10*3/uL (ref 0.1–0.9)
Monocytes: 6 %
Neutrophils Absolute: 4.1 10*3/uL (ref 1.4–7.0)
Neutrophils: 65 %
Platelets: 272 10*3/uL (ref 150–379)
RBC: 4.16 x10E6/uL (ref 3.77–5.28)
RDW: 15.4 % (ref 12.3–15.4)
WBC: 6.4 10*3/uL (ref 3.4–10.8)

## 2015-11-10 LAB — COMPREHENSIVE METABOLIC PANEL
A/G RATIO: 1.4 (ref 1.2–2.2)
ALK PHOS: 71 IU/L (ref 39–117)
ALT: 11 IU/L (ref 0–32)
AST: 18 IU/L (ref 0–40)
Albumin: 4.1 g/dL (ref 3.5–5.5)
BILIRUBIN TOTAL: 0.4 mg/dL (ref 0.0–1.2)
BUN/Creatinine Ratio: 12 (ref 9–23)
BUN: 10 mg/dL (ref 6–24)
CO2: 22 mmol/L (ref 18–29)
Calcium: 9.2 mg/dL (ref 8.7–10.2)
Chloride: 103 mmol/L (ref 96–106)
Creatinine, Ser: 0.81 mg/dL (ref 0.57–1.00)
GFR calc Af Amer: 93 mL/min/{1.73_m2} (ref >59)
GFR calc non Af Amer: 80 mL/min/{1.73_m2} (ref >59)
GLOBULIN, TOTAL: 2.9 g/dL (ref 1.5–4.5)
Glucose: 97 mg/dL (ref 65–99)
POTASSIUM: 4.4 mmol/L (ref 3.5–5.2)
SODIUM: 141 mmol/L (ref 134–144)
Total Protein: 7 g/dL (ref 6.0–8.5)

## 2015-11-10 LAB — LIPID PANEL W/O CHOL/HDL RATIO
CHOLESTEROL TOTAL: 148 mg/dL (ref 100–199)
HDL: 72 mg/dL (ref >39)
LDL Calculated: 60 mg/dL (ref 0–99)
TRIGLYCERIDES: 78 mg/dL (ref 0–149)
VLDL Cholesterol Cal: 16 mg/dL (ref 5–40)

## 2015-11-10 LAB — TSH: TSH: 3.28 u[IU]/mL (ref 0.450–4.500)

## 2015-11-10 LAB — HEPATITIS C ANTIBODY: Hep C Virus Ab: <0.1 s/co ratio (ref 0.0–0.9)

## 2016-03-24 ENCOUNTER — Telehealth: Payer: Self-pay | Admitting: *Deleted

## 2016-03-24 NOTE — Telephone Encounter (Signed)
Opened in error

## 2016-03-31 ENCOUNTER — Telehealth: Payer: PRIVATE HEALTH INSURANCE | Admitting: Family

## 2016-03-31 ENCOUNTER — Other Ambulatory Visit: Payer: Self-pay | Admitting: Family Medicine

## 2016-03-31 DIAGNOSIS — H60332 Swimmer's ear, left ear: Secondary | ICD-10-CM

## 2016-03-31 DIAGNOSIS — H6092 Unspecified otitis externa, left ear: Secondary | ICD-10-CM

## 2016-03-31 MED ORDER — CIPROFLOXACIN-HYDROCORTISONE 0.2-1 % OT SUSP: 3.0000 [drp] | Freq: Two times a day (BID) | OTIC | 0 refills | Status: DC

## 2016-03-31 NOTE — Progress Notes (Signed)
E Visit for Swimmer's Ear  We are sorry that you are not feeling well. Here is how we plan to help!  I have prescribed: Ciprofloxin 0.2% and hydrocortisone 1% otic suspension 3 drops in affected ears twice daily until completed   In certain cases swimmer's ear may progress to a more serious bacterial infection of the middle or inner ear.  If you have a fever 102 and up and significantly worsening symptoms, this could indicate a more serious infection moving to the middle/inner and needs face to face evaluation in an office by a provider.  Your symptoms should improve over the next 3 days and should resolve in about 7 days.  HOME CARE:   Wash your hands frequently.  Do not place the tip of the bottle on your ear or touch it with your fingers.  You can take Acetominophen 650 mg every 4-6 hours as needed for pain.  If pain is severe or moderate, you can apply a heating pad (set on low) or hot water bottle (wrapped in a towel) to outer ear for 20 minutes.  This will also increase drainage.  Avoid ear plugs  Do not use Q-tips  After showers, help the water run out by tilting your head to one side.  GET HELP RIGHT AWAY IF:   Fever is over 102.2 degrees.  You develop progressive ear pain or hearing loss.  Ear symptoms persist longer than 3 days after treatment.  MAKE SURE YOU:   Understand these instructions.  Will watch your condition.  Will get help right away if you are not doing well or get worse.  TO PREVENT SWIMMER'S EAR:  Use a bathing cap or custom fitted swim molds to keep your ears dry.  Towel off after swimming to dry your ears.  Tilt your head or pull your earlobes to allow the water to escape your ear canal.  If there is still water in your ears, consider using a hairdryer on the lowest setting.  Thank you for choosing an e-visit. Your e-visit answers were reviewed by a board certified advanced clinical practitioner to complete your personal care plan.  Depending upon the condition, your plan could have included both over the counter or prescription medications. Please review your pharmacy choice. Be sure that the pharmacy you have chosen is open so that you can pick up your prescription now.  If there is a problem you may message your provider in MyChart to have the prescription routed to another pharmacy. Your safety is important to us. If you have drug allergies check your prescription carefully.  For the next 24 hours, you can use MyChart to ask questions about today's visit, request a non-urgent call back, or ask for a work or school excuse from your e-visit provider. You will get an email in the next two days asking about your experience. I hope that your e-visit has been valuable and will speed your recovery.      

## 2016-05-16 ENCOUNTER — Encounter: Payer: Self-pay | Admitting: Family Medicine

## 2016-05-16 ENCOUNTER — Ambulatory Visit (INDEPENDENT_AMBULATORY_CARE_PROVIDER_SITE_OTHER): Payer: PRIVATE HEALTH INSURANCE | Admitting: Family Medicine

## 2016-05-16 ENCOUNTER — Telehealth: Payer: Self-pay | Admitting: Family Medicine

## 2016-05-16 VITALS — BP 131/77 | HR 69 | Temp 97.7°F | Wt 355.0 lb

## 2016-05-16 DIAGNOSIS — F419 Anxiety disorder, unspecified: Secondary | ICD-10-CM | POA: Diagnosis not present

## 2016-05-16 DIAGNOSIS — R002 Palpitations: Secondary | ICD-10-CM

## 2016-05-16 DIAGNOSIS — Z6841 Body Mass Index (BMI) 40.0 and over, adult: Secondary | ICD-10-CM

## 2016-05-16 DIAGNOSIS — Z23 Encounter for immunization: Secondary | ICD-10-CM | POA: Diagnosis not present

## 2016-05-16 MED ORDER — LORAZEPAM 1 MG PO TABS: 1.0000 mg | ORAL_TABLET | Freq: Every day | ORAL | 0 refills | Status: DC | PRN

## 2016-05-16 NOTE — Telephone Encounter (Signed)
Pt was seen this morning. Called and stated she is feeling dizzy and light headed. Pt stated she was feeling dizzy during check out and asked if staff had any candy or anything. Staff provided candy for the patient. Please call pt to follow up. Thanks.

## 2016-05-16 NOTE — Assessment & Plan Note (Signed)
Patient still has some lorazepam left from last year will give fresh prescription cautions.

## 2016-05-16 NOTE — Progress Notes (Signed)
BP 131/77   Pulse 69   Temp 97.7 F (36.5 C)   Wt (!) 355 lb (161 kg)   SpO2 99%   BMI 53.50 kg/m    Subjective:    Patient ID: Mary Hardin, female    DOB: 11/23/1955, 60 y.o.   MRN: [id number]  HPI: Mary Hardin is a 60 y.o. female  Chief Complaint  Patient presents with  . Anxiety    doing well  . Depression  Patient follow-up depression doing well with Lexapro takes without side effects or problems. Anxiety is stable.  Patient is concerned has family history of atrial fibrillation has racing heartbeat intermittently from time to time has been to cardiology and was diagnosed with symptoms being caused by being overweight with no real cardiovascular issues. Symptoms have persisted though with Benadryl to become on predictably with racing heartbeat with avoiding urination then has rapid heartbeats for for about 2 minutes.   Relevant past medical, surgical, family and social history reviewed and updated as indicated. Interim medical history since our last visit reviewed. Allergies and medications reviewed and updated.  Review of Systems  Constitutional: Negative.   Respiratory: Negative.   Cardiovascular: Positive for palpitations. Negative for chest pain and leg swelling.    Per HPI unless specifically indicated above     Objective:    BP 131/77   Pulse 69   Temp 97.7 F (36.5 C)   Wt (!) 355 lb (161 kg)   SpO2 99%   BMI 53.50 kg/m   Wt Readings from Last 3 Encounters:  05/16/16 (!) 355 lb (161 kg)  11/09/15 (!) 349 lb (158.3 kg)  05/11/15 (!) 348 lb (157.9 kg)    Physical Exam  Constitutional: She is oriented to person, place, and time. She appears well-developed and well-nourished. No distress.  HENT:  Head: Normocephalic and atraumatic.  Right Ear: Hearing normal.  Left Ear: Hearing normal.  Nose: Nose normal.  Eyes: Conjunctivae and lids are normal. Right eye exhibits no discharge. Left eye exhibits no discharge. No scleral icterus.    Pulmonary/Chest: Effort normal. No respiratory distress.  Musculoskeletal: Normal range of motion.  Neurological: She is alert and oriented to person, place, and time.  Skin: Skin is intact. No rash noted.  Psychiatric: She has a normal mood and affect. Her speech is normal and behavior is normal. Judgment and thought content normal. Cognition and memory are normal.    Results for orders placed or performed in visit on 11/09/15  Hepatitis C Antibody  Result Value Ref Range   Hep C Virus Ab <0.1 0.0 - 0.9 s/co ratio  CBC with Differential/Platelet  Result Value Ref Range   WBC 6.4 3.4 - 10.8 x10E3/uL   RBC 4.16 3.77 - 5.28 x10E6/uL   Hemoglobin 11.1 11.1 - 15.9 g/dL   Hematocrit 57.8 46.9 - 46.6 %   MCV 83 79 - 97 fL   MCH 26.7 26.6 - 33.0 pg   MCHC 32.2 31.5 - 35.7 g/dL   RDW 62.9 52.8 - 41.3 %   Platelets 272 150 - 379 x10E3/uL   Neutrophils 65 %   Lymphs 26 %   Monocytes 6 %   Eos 2 %   Basos 1 %   Neutrophils Absolute 4.1 1.4 - 7.0 x10E3/uL   Lymphocytes Absolute 1.7 0.7 - 3.1 x10E3/uL   Monocytes Absolute 0.4 0.1 - 0.9 x10E3/uL   EOS (ABSOLUTE) 0.2 0.0 - 0.4 x10E3/uL   Basophils Absolute 0.0 0.0 - 0.2  x10E3/uL   Immature Granulocytes 0 %   Immature Grans (Abs) 0.0 0.0 - 0.1 x10E3/uL  Comprehensive metabolic panel  Result Value Ref Range   Glucose 97 65 - 99 mg/dL   BUN 10 6 - 24 mg/dL   Creatinine, Ser 1.610.81 0.57 - 1.00 mg/dL   GFR calc non Af Amer 80 >59 mL/min/1.73   GFR calc Af Amer 93 >59 mL/min/1.73   BUN/Creatinine Ratio 12 9 - 23   Sodium 141 134 - 144 mmol/L   Potassium 4.4 3.5 - 5.2 mmol/L   Chloride 103 96 - 106 mmol/L   CO2 22 18 - 29 mmol/L   Calcium 9.2 8.7 - 10.2 mg/dL   Total Protein 7.0 6.0 - 8.5 g/dL   Albumin 4.1 3.5 - 5.5 g/dL   Globulin, Total 2.9 1.5 - 4.5 g/dL   Albumin/Globulin Ratio 1.4 1.2 - 2.2   Bilirubin Total 0.4 0.0 - 1.2 mg/dL   Alkaline Phosphatase 71 39 - 117 IU/L   AST 18 0 - 40 IU/L   ALT 11 0 - 32 IU/L  Lipid Panel w/o  Chol/HDL Ratio  Result Value Ref Range   Cholesterol, Total 148 100 - 199 mg/dL   Triglycerides 78 0 - 149 mg/dL   HDL 72 >09>39 mg/dL   VLDL Cholesterol Cal 16 5 - 40 mg/dL   LDL Calculated 60 0 - 99 mg/dL  TSH  Result Value Ref Range   TSH 3.280 0.450 - 4.500 uIU/mL  Urinalysis, Routine w reflex microscopic (not at [facility])  Result Value Ref Range   Specific Gravity, UA 1.010 1.005 - 1.030   pH, UA 7.5 5.0 - 7.5   Color, UA Yellow Yellow   Appearance Ur Clear Clear   Leukocytes, UA Negative Negative   Protein, UA Negative Negative/Trace   Glucose, UA Negative Negative   Ketones, UA Negative Negative   RBC, UA Negative Negative   Bilirubin, UA Negative Negative   Urobilinogen, Ur 0.2 0.2 - 1.0 mg/dL   Nitrite, UA Negative Negative      Assessment & Plan:   Problem List Items Addressed This Visit      Other   BMI 50.0-59.9, adult (HCC)    Diet wt loss      Anxiety    Patient still has some lorazepam left from last year will give fresh prescription cautions.      Relevant Medications   LORazepam (ATIVAN) 1 MG tablet   Palpitations    EKG normal will schedule 24 hour EKG and patient will try to cause palpitations.      Relevant Orders   EKG 12-Lead (Completed)    Other Visit Diagnoses    Needs flu shot    -  Primary   Encounter for immunization       Relevant Orders   Flu Vaccine QUAD 36+ mos IM (Completed)       Follow up plan: Return in about 6 months (around 11/14/2016) for PE blood work no pe med check.

## 2016-05-16 NOTE — Telephone Encounter (Signed)
Call pt 

## 2016-05-16 NOTE — Assessment & Plan Note (Signed)
EKG normal will schedule 24 hour EKG and patient will try to cause palpitations.

## 2016-05-16 NOTE — Telephone Encounter (Signed)
Phone call Discussed with patient still little lightheaded will observe symptoms and follow-up if problems persist.

## 2016-05-16 NOTE — Assessment & Plan Note (Signed)
Diet wt loss 

## 2016-05-16 NOTE — Telephone Encounter (Signed)
Routing to provider for advice.

## 2016-05-25 ENCOUNTER — Telehealth: Payer: Self-pay | Admitting: Family Medicine

## 2016-05-25 NOTE — Telephone Encounter (Signed)
Pt called would like to know the status of the order for a heart monitor she was supposed to wear at home. Please follow up with patient as soon as possible. Thanks.

## 2016-05-26 NOTE — Telephone Encounter (Signed)
Left message to call. [employer] [facility] [phone]

## 2016-05-26 NOTE — Telephone Encounter (Signed)
Patient given the number to contact [facility]ulatory monitoring.

## 2016-05-29 ENCOUNTER — Telehealth: Payer: Self-pay

## 2016-05-29 NOTE — Telephone Encounter (Signed)
Patient called and stated that she had called [facility] to schedule her holter monitor and they had not called her back. She wanted to know if I could call them.

## 2016-05-29 NOTE — Telephone Encounter (Signed)
I spoke with Tiffany, she asked that I refax the patients order and stated that she would call the patient now and get her scheduled.

## 2016-05-30 NOTE — Telephone Encounter (Signed)
I spoke with patient; they got her scheduled.

## 2016-06-13 ENCOUNTER — Telehealth: Payer: Self-pay

## 2016-06-13 DIAGNOSIS — R002 Palpitations: Secondary | ICD-10-CM

## 2016-06-13 DIAGNOSIS — I493 Ventricular premature depolarization: Secondary | ICD-10-CM

## 2016-06-13 NOTE — Telephone Encounter (Signed)
Call re: 24 hr ekg. Left message to call.

## 2016-06-13 NOTE — Telephone Encounter (Signed)
Left message to call.

## 2016-06-14 DIAGNOSIS — I493 Ventricular premature depolarization: Secondary | ICD-10-CM | POA: Insufficient documentation

## 2016-06-14 NOTE — Telephone Encounter (Signed)
Phone call Discussed with patient 24 hour EKG showing PVCs and a run of PVCs. An occasional pause. Will refer to cardiology to further evaluate.

## 2016-06-14 NOTE — Telephone Encounter (Signed)
Called and left a message on both contact numbers for the patient, asking her to return our call.

## 2016-06-20 ENCOUNTER — Encounter: Payer: Self-pay | Admitting: Family Medicine

## 2016-07-21 ENCOUNTER — Institutional Professional Consult (permissible substitution): Payer: PRIVATE HEALTH INSURANCE | Admitting: Cardiology

## 2016-08-01 ENCOUNTER — Institutional Professional Consult (permissible substitution): Payer: PRIVATE HEALTH INSURANCE | Admitting: Cardiology

## 2016-08-08 ENCOUNTER — Telehealth: Payer: Self-pay | Admitting: Cardiology

## 2016-08-08 ENCOUNTER — Encounter: Payer: Self-pay | Admitting: Cardiology

## 2016-08-08 ENCOUNTER — Ambulatory Visit (INDEPENDENT_AMBULATORY_CARE_PROVIDER_SITE_OTHER): Payer: PRIVATE HEALTH INSURANCE | Admitting: Cardiology

## 2016-08-08 VITALS — BP 108/80 | HR 59 | Ht 68.0 in | Wt 353.5 lb

## 2016-08-08 DIAGNOSIS — E6609 Other obesity due to excess calories: Secondary | ICD-10-CM | POA: Diagnosis not present

## 2016-08-08 DIAGNOSIS — R9431 Abnormal electrocardiogram [ECG] [EKG]: Secondary | ICD-10-CM

## 2016-08-08 DIAGNOSIS — IMO0001 Reserved for inherently not codable concepts without codable children: Secondary | ICD-10-CM

## 2016-08-08 DIAGNOSIS — Z6841 Body Mass Index (BMI) 40.0 and over, adult: Secondary | ICD-10-CM

## 2016-08-08 DIAGNOSIS — I493 Ventricular premature depolarization: Secondary | ICD-10-CM

## 2016-08-08 DIAGNOSIS — R002 Palpitations: Secondary | ICD-10-CM | POA: Diagnosis not present

## 2016-08-08 NOTE — Telephone Encounter (Signed)
Left message on pt cell VM that [facility], arrival 9:45am.  Requested CB to confirm receipt of message.

## 2016-08-08 NOTE — Telephone Encounter (Signed)
Pt states she can not make [facility]. Left message for scheduling to call back.

## 2016-08-08 NOTE — Telephone Encounter (Signed)
Echo [facility] January [facility] Pt agreeable.

## 2016-08-08 NOTE — Patient Instructions (Addendum)
Medication Instructions:  Your physician recommends that you continue on your current medications as directed. Please refer to the Current Medication list given to you today.   Labwork: none  Testing/Procedures: Your physician has requested that you have an echocardi[facility] at [facility]. Echocardiography is a painless test that uses sound waves to create images of your heart. It provides your doctor with information about the size and shape of your heart and how well your heart's chambers and valves are working. This procedure takes approximately one hour. There are no restrictions for this procedure.  We will call you with a date and time.   Follow-Up: Your physician recommends that you schedule a follow-up appointment as needed with Dr. Alvino ChapelIngal.    Any Other Special Instructions Will Be Listed Below (If Applicable).     If you need a refill on your cardiac medications before your next appointment, please call your pharmacy.  .Echocardiogram An echocardiogram, or echocardiography, uses sound waves (ultrasound) to produce an image of your heart. The echocardiogram is simple, painless, obtained within a short period of time, and offers valuable information to your health care provider. The images from an echocardiogram can provide information such as:  Evidence of coronary artery disease (CAD).  Heart size.  Heart muscle function.  Heart valve function.  Aneurysm detection.  Evidence of a past heart attack.  Fluid buildup around the heart.  Heart muscle thickening.  Assess heart valve function. Tell a health care provider about:  Any allergies you have.  All medicines you are taking, including vitamins, herbs, eye drops, creams, and over-the-counter medicines.  Any problems you or family members have had with anesthetic medicines.  Any blood disorders you have.  Any surgeries you have had.  Any medical conditions you have.  Whether you are pregnant or may be  pregnant. What happens before the procedure? No special preparation is needed. Eat and drink normally. What happens during the procedure?  In order to produce an image of your heart, gel will be applied to your chest and a wand-like tool (transducer) will be moved over your chest. The gel will help transmit the sound waves from the transducer. The sound waves will harmlessly bounce off your heart to allow the heart images to be captured in real-time motion. These images will then be recorded.  You may need an IV to receive a medicine that improves the quality of the pictures. What happens after the procedure? You may return to your normal schedule including diet, activities, and medicines, unless your health care provider tells you otherwise. This information is not intended to replace advice given to you by your health care provider. Make sure you discuss any questions you have with your health care provider. Document Released: 07/07/2000 Document Revised: 02/26/2016 Document Reviewed: 03/17/2013 Elsevier Interactive Patient Education  2017 [employer]

## 2016-08-08 NOTE — Progress Notes (Signed)
Cardiology Office Note   Date:  08/08/2016   ID:  Marielys, Trinidad [birth year], MRN [id number]  Referring Doctor:  Vonita Moss, MD   Cardiologist:   Almond Lint, MD   Reason for consultation:  Chief Complaint  Patient presents with  . other    Ref by Dr. Vonita Moss for PVC's & follow up 24 hour holter monitor. Pt. saw Dr. Elease Hashimoto in [place] office in 07/2014.  Meds reviewed by the pt. verbally.       History of Present Illness: Mary Hardin is a 61 y.o. female who presents for Palpitations, PVCs, findings on Holter monitor  Patient presented to PCP with complaints of feeling her heart racing. This has been going on for a few months. Symptoms were mild to moderate intensity, lasting a few seconds or so, occurs with exertion at times, randomly occurring. She reports associated chest discomfort when she starts feeling the heart racing. She was also having some shortness of breath.  Recently, she was diagnosed to have anemia. Since she started iron supplementation, the palpitations, chest pain, and shortness of breath actually improved significantly. She has lost 10 pounds and she continues to feel better overall.  Currently no chest pain, shortness breath is improved from before, no PND, orthopnea, edema, loss of consciousness   ROS:  Please see the history of present illness. Aside from mentioned under HPI, all other systems are reviewed and negative.     Past Medical History:  Diagnosis Date  . Anemia   . Anxiety   . Family history of malignant neoplasm of breast   . GERD (gastroesophageal reflux disease)   . Hypertension     Past Surgical History:  Procedure Laterality Date  . CHOLECYSTECTOMY    . DILATION AND CURETTAGE OF UTERUS     x2     reports that she has never smoked. She has never used smokeless tobacco. She reports that she drinks alcohol. She reports that she does not use drugs.   family history includes Arrhythmia in her mother; Breast cancer  (age of onset: 52) in her maternal aunt; Breast cancer (age of onset: 42) in her maternal grandmother; Breast cancer (age of onset: 46) in her mother; Diabetes in her maternal grandmother; Hyperlipidemia in her father; Hypertension in her mother; Pancreatic cancer (age of onset: 48) in her paternal grandmother; Seizures in her father; Stroke in her father; Thyroid cancer in her cousin.   Outpatient Medications Prior to Visit  Medication Sig Dispense Refill  . escitalopram (LEXAPRO) 20 MG tablet Take 1 tablet (20 mg total) by mouth daily. 30 tablet 12  . mupirocin ointment (BACTROBAN) 2 % APPLY TO AFFECTED AREAS 3 TIMES DAILY 22 g 0  . fluticasone (FLONASE) 50 MCG/ACT nasal spray Place 2 sprays into both nostrils daily as needed for allergies or rhinitis.    . Fluticasone-Salmeterol (ADVAIR DISKUS) 100-50 MCG/DOSE AEPB Inhale into the lungs as needed.    Marland Kitchen LORazepam (ATIVAN) 1 MG tablet Take 1 tablet (1 mg total) by mouth daily as needed for anxiety. (Patient not taking: Reported on 08/08/2016) 30 tablet 0   No facility-administered medications prior to visit.      Allergies: Shellfish-derived products    PHYSICAL EXAM: VS:  BP 108/80 (BP Location: Right Wrist, Patient Position: Sitting, Cuff Size: Normal)   Pulse (!) 59   Ht 5\' 8"  (1.727 m)   Wt (!) 353 lb 8 oz (160.3 kg)   BMI 53.75 kg/m  , Body  mass index is 53.75 kg/m. Wt Readings from Last 3 Encounters:  08/08/16 (!) 353 lb 8 oz (160.3 kg)  05/16/16 (!) 355 lb (161 kg)  11/09/15 (!) 349 lb (158.3 kg)    GENERAL:  well developed, well nourished, Morbidly obese, not in acute distress HEENT: normocephalic, pink conjunctivae, anicteric sclerae, no xanthelasma, normal dentition, oropharynx clear NECK:  no neck vein engorgement, JVP normal, no hepatojugular reflux, carotid upstroke brisk and symmetric, no bruit, no thyromegaly, no lymphadenopathy LUNGS:  good respiratory effort, clear to auscultation bilaterally CV:  PMI not displaced,  no thrills, no lifts, S1 and S2 within normal limits, no palpable S3 or S4, no murmurs, no rubs, no gallops ABD:  Soft, nontender, nondistended, normoactive bowel sounds, no abdominal aortic bruit, no hepatomegaly, no splenomegaly MS: nontender back, no kyphosis, no scoliosis, no joint deformities EXT:  2+ DP/PT pulses, no edema, no varicosities, no cyanosis, no clubbing SKIN: warm, nondiaphoretic, normal turgor, no ulcers NEUROPSYCH: alert, oriented to person, place, and time, sensory/motor grossly intact, normal mood, appropriate affect  Recent Labs: 11/09/2015: ALT 11; BUN 10; Creatinine, Ser 0.81; Platelets 272; Potassium 4.4; Sodium 141; TSH 3.280   Lipid Panel    Component Value Date/Time   CHOL 148 11/09/2015 0842   TRIG 78 11/09/2015 0842   HDL 72 11/09/2015 0842   LDLCALC 60 11/09/2015 0842     Other studies Reviewed:  EKG:  The ekg from 08/08/2016 was personally reviewed by me and it revealed sinus rhythm, PR of 224 ms, heart rate 60 bpm, possible LVH.  Additional studies/ records that were reviewed personally reviewed by me today include:  Holter report 06/06/2016: This was personally reviewed by me and it showed: Sinus rhythm. Heart rate ranged from 50-119 bpm, average of 66 BPM. ventricular ectopy was less than 1% of the total number of beats: included 1345 isolated PVCs, 8 ventricular couplets, and one ventricular run. This was at 12:08 AM consisting of 6 beats of PVCs. No high grade supraventricular ectopy: Single PACs, atrial pairs. No documented atrial fibrillation.  ASSESSMENT AND PLAN: First-degree AV block Possible LVH Abn EKG Palpitations Holter showed PVCs but not anywhere close to 10% of the total number of beats. A 6 beat run of VT occurred around 12 midnight, patient asleep. Likely related to sleep apnea. No high grade supraventricular ectopy. No A. fib noted. Patient reports that since she started treatment for anemia, she has noted improvement of  palpitations along with other symptoms of chest pain and shortness of breath. Recommend echocardiogram for now.  Obesity Patient advised to continue to monitor herself for symptoms of chest discomfort or shortness of breath with exercise. She would like to continue exercising and dieting to continue to lose weight. Chest lost 10 pounds so far.  Current medicines are reviewed at length with the patient today.  The patient does not have concerns regarding medicines.  Labs/ tests ordered today include: Orders Placed This Encounter  Procedures  . EKG 12-Lead    I had a lengthy and detailed discussion with the patient regarding diagnoses, prognosis, diagnostic options.  I counseled the patient on importance of lifestyle modification including heart healthy diet, regular physical activity .   Disposition:   FU with undersigned after tests prn  Signed, Almond LintAileen Katrece Roediger, MD  08/08/2016 10:54 AM    [facility]  This note was generated in part with voice recognition software and I apologize for any typographical errors that were not detected and corrected.

## 2016-08-16 ENCOUNTER — Ambulatory Visit: Payer: PRIVATE HEALTH INSURANCE

## 2016-08-18 ENCOUNTER — Ambulatory Visit
Admission: RE | Admit: 2016-08-18 | Discharge: 2016-08-18 | Disposition: A | Discharge status: Home / Self Care | Payer: PRIVATE HEALTH INSURANCE | Source: Ambulatory Visit | Attending: Cardiology | Admitting: Cardiology

## 2016-08-18 DIAGNOSIS — I10 Essential (primary) hypertension: Secondary | ICD-10-CM | POA: Diagnosis not present

## 2016-08-18 DIAGNOSIS — D649 Anemia, unspecified: Secondary | ICD-10-CM | POA: Diagnosis not present

## 2016-08-18 DIAGNOSIS — K219 Gastro-esophageal reflux disease without esophagitis: Secondary | ICD-10-CM | POA: Insufficient documentation

## 2016-08-18 DIAGNOSIS — F419 Anxiety disorder, unspecified: Secondary | ICD-10-CM | POA: Insufficient documentation

## 2016-08-18 DIAGNOSIS — I493 Ventricular premature depolarization: Secondary | ICD-10-CM | POA: Insufficient documentation

## 2016-08-18 NOTE — Progress Notes (Signed)
*  PRELIMINARY RESULTS* Echocardiogram 2D Echocardiogram has been performed.  Cristela BlueHege, Denario Bagot 08/18/2016, 11:02 AM

## 2016-11-14 ENCOUNTER — Ambulatory Visit: Payer: PRIVATE HEALTH INSURANCE | Admitting: Family Medicine

## 2016-11-28 ENCOUNTER — Encounter: Payer: Self-pay | Admitting: Family Medicine

## 2016-11-28 ENCOUNTER — Ambulatory Visit (INDEPENDENT_AMBULATORY_CARE_PROVIDER_SITE_OTHER): Payer: PRIVATE HEALTH INSURANCE | Admitting: Family Medicine

## 2016-11-28 VITALS — BP 138/76 | HR 72 | Ht 67.72 in | Wt 356.0 lb

## 2016-11-28 DIAGNOSIS — R0789 Other chest pain: Secondary | ICD-10-CM

## 2016-11-28 DIAGNOSIS — I10 Essential (primary) hypertension: Secondary | ICD-10-CM

## 2016-11-28 DIAGNOSIS — Z1329 Encounter for screening for other suspected endocrine disorder: Secondary | ICD-10-CM | POA: Diagnosis not present

## 2016-11-28 DIAGNOSIS — Z6841 Body Mass Index (BMI) 40.0 and over, adult: Secondary | ICD-10-CM

## 2016-11-28 DIAGNOSIS — F329 Major depressive disorder, single episode, unspecified: Secondary | ICD-10-CM

## 2016-11-28 DIAGNOSIS — Z131 Encounter for screening for diabetes mellitus: Secondary | ICD-10-CM

## 2016-11-28 DIAGNOSIS — F32A Depression, unspecified: Secondary | ICD-10-CM

## 2016-11-28 DIAGNOSIS — Z13220 Encounter for screening for lipoid disorders: Secondary | ICD-10-CM | POA: Diagnosis not present

## 2016-11-28 LAB — URINALYSIS, ROUTINE W REFLEX MICROSCOPIC
Bilirubin, UA: NEGATIVE
GLUCOSE, UA: NEGATIVE
Ketones, UA: NEGATIVE
LEUKOCYTES UA: NEGATIVE
Nitrite, UA: NEGATIVE
Protein, UA: NEGATIVE
RBC, UA: NEGATIVE
SPEC GRAV UA: 1.01 (ref 1.005–1.030)
Urobilinogen, Ur: 0.2 mg/dL (ref 0.2–1.0)
pH, UA: 7 (ref 5.0–7.5)

## 2016-11-28 LAB — MICROSCOPIC EXAMINATION
Bacteria, UA: NONE SEEN
RBC, UA: NONE SEEN /hpf (ref 0–?)
WBC, UA: NONE SEEN /hpf (ref 0–?)

## 2016-11-28 MED ORDER — EPINEPHRINE 0.3 MG/0.3ML IJ SOAJ: 0.3000 mg | Freq: Once | INTRAMUSCULAR | 2 refills | Status: AC

## 2016-11-28 MED ORDER — ALBUTEROL SULFATE HFA 108 (90 BASE) MCG/ACT IN AERS: 2.0000 | INHALATION_SPRAY | Freq: Four times a day (QID) | RESPIRATORY_TRACT | 0 refills | Status: DC | PRN

## 2016-11-28 MED ORDER — ESCITALOPRAM OXALATE 20 MG PO TABS: 20.0000 mg | ORAL_TABLET | Freq: Every day | ORAL | 12 refills | Status: DC

## 2016-11-28 MED ORDER — FLUTICASONE-SALMETEROL 100-50 MCG/DOSE IN AEPB: 1.0000 | INHALATION_SPRAY | RESPIRATORY_TRACT | 12 refills | Status: DC | PRN

## 2016-11-28 MED ORDER — NAPROXEN 500 MG PO TABS: 500.0000 mg | ORAL_TABLET | Freq: Two times a day (BID) | ORAL | 4 refills | Status: DC

## 2016-11-28 NOTE — Assessment & Plan Note (Signed)
Patient doing well with Lexapro. Uses rare lorazepam still has some leftover will call and needs for stormy date use.

## 2016-11-28 NOTE — Progress Notes (Signed)
BP 138/76 (BP Location: Left Arm)   Pulse 72   Ht 5' 7.72" (1.72 m)   Wt (!) 356 lb (161.5 kg)   SpO2 99%   BMI 54.58 kg/m    Subjective:    Patient ID: Mary Hardin, female    DOB: 11/16/1955, 61 y.o.   MRN: [id number]  HPI: Mary Hardin is a 61 y.o. female  Chief Complaint  Patient presents with  . Follow-up  . Hypertension  Annual exam  Patient with a lot of arthritis complaints ankles especially knees neck active Naprosyn helps wants a refill for that has used some occasional Voltaren gel but Naprosyn works better Blood pressures been doing well. Patient's been struggling with weight and vacation. Nexium doing well for reflux. Lexapro doing well for nerves.  Relevant past medical, surgical, family and social history reviewed and updated as indicated. Interim medical history since our last visit reviewed. Allergies and medications reviewed and updated.  Review of Systems  Constitutional: Negative.   HENT: Negative.   Eyes: Negative.   Respiratory: Negative.   Cardiovascular: Negative.   Gastrointestinal: Negative.   Endocrine: Negative.   Genitourinary: Negative.   Musculoskeletal: Negative.   Skin: Negative.   Allergic/Immunologic: Negative.   Neurological: Negative.   Hematological: Negative.   Psychiatric/Behavioral: Negative.     Per HPI unless specifically indicated above     Objective:    BP 138/76 (BP Location: Left Arm)   Pulse 72   Ht 5' 7.72" (1.72 m)   Wt (!) 356 lb (161.5 kg)   SpO2 99%   BMI 54.58 kg/m   Wt Readings from Last 3 Encounters:  11/28/16 (!) 356 lb (161.5 kg)  08/08/16 (!) 353 lb 8 oz (160.3 kg)  05/16/16 (!) 355 lb (161 kg)    Physical Exam  Constitutional: She is oriented to person, place, and time. She appears well-developed and well-nourished.  HENT:  Head: Normocephalic and atraumatic.  Eyes: Conjunctivae and EOM are normal.  Neck: Normal range of motion.  Cardiovascular: Normal rate, regular rhythm and  normal heart sounds.   Pulmonary/Chest: Effort normal and breath sounds normal.  Musculoskeletal: Normal range of motion.  Neurological: She is alert and oriented to person, place, and time.  Skin: No erythema.  Psychiatric: She has a normal mood and affect. Her behavior is normal. Judgment and thought content normal.    Results for orders placed or performed in visit on 11/09/15  Hepatitis C Antibody  Result Value Ref Range   Hep C Virus Ab <0.1 0.0 - 0.9 s/co ratio  CBC with Differential/Platelet  Result Value Ref Range   WBC 6.4 3.4 - 10.8 x10E3/uL   RBC 4.16 3.77 - 5.28 x10E6/uL   Hemoglobin 11.1 11.1 - 15.9 g/dL   Hematocrit 40.934.5 81.134.0 - 46.6 %   MCV 83 79 - 97 fL   MCH 26.7 26.6 - 33.0 pg   MCHC 32.2 31.5 - 35.7 g/dL   RDW 91.415.4 78.212.3 - 95.615.4 %   Platelets 272 150 - 379 x10E3/uL   Neutrophils 65 %   Lymphs 26 %   Monocytes 6 %   Eos 2 %   Basos 1 %   Neutrophils Absolute 4.1 1.4 - 7.0 x10E3/uL   Lymphocytes Absolute 1.7 0.7 - 3.1 x10E3/uL   Monocytes Absolute 0.4 0.1 - 0.9 x10E3/uL   EOS (ABSOLUTE) 0.2 0.0 - 0.4 x10E3/uL   Basophils Absolute 0.0 0.0 - 0.2 x10E3/uL   Immature Granulocytes 0 %  Immature Grans (Abs) 0.0 0.0 - 0.1 x10E3/uL  Comprehensive metabolic panel  Result Value Ref Range   Glucose 97 65 - 99 mg/dL   BUN 10 6 - 24 mg/dL   Creatinine, Ser 1.61 0.57 - 1.00 mg/dL   GFR calc non Af Amer 80 >59 mL/min/1.73   GFR calc Af Amer 93 >59 mL/min/1.73   BUN/Creatinine Ratio 12 9 - 23   Sodium 141 134 - 144 mmol/L   Potassium 4.4 3.5 - 5.2 mmol/L   Chloride 103 96 - 106 mmol/L   CO2 22 18 - 29 mmol/L   Calcium 9.2 8.7 - 10.2 mg/dL   Total Protein 7.0 6.0 - 8.5 g/dL   Albumin 4.1 3.5 - 5.5 g/dL   Globulin, Total 2.9 1.5 - 4.5 g/dL   Albumin/Globulin Ratio 1.4 1.2 - 2.2   Bilirubin Total 0.4 0.0 - 1.2 mg/dL   Alkaline Phosphatase 71 39 - 117 IU/L   AST 18 0 - 40 IU/L   ALT 11 0 - 32 IU/L  Lipid Panel w/o Chol/HDL Ratio  Result Value Ref Range    Cholesterol, Total 148 100 - 199 mg/dL   Triglycerides 78 0 - 149 mg/dL   HDL 72 >09 mg/dL   VLDL Cholesterol Cal 16 5 - 40 mg/dL   LDL Calculated 60 0 - 99 mg/dL  TSH  Result Value Ref Range   TSH 3.280 0.450 - 4.500 uIU/mL  Urinalysis, Routine w reflex microscopic (not at [facility])  Result Value Ref Range   Specific Gravity, UA 1.010 1.005 - 1.030   pH, UA 7.5 5.0 - 7.5   Color, UA Yellow Yellow   Appearance Ur Clear Clear   Leukocytes, UA Negative Negative   Protein, UA Negative Negative/Trace   Glucose, UA Negative Negative   Ketones, UA Negative Negative   RBC, UA Negative Negative   Bilirubin, UA Negative Negative   Urobilinogen, Ur 0.2 0.2 - 1.0 mg/dL   Nitrite, UA Negative Negative      Assessment & Plan:   Problem List Items Addressed This Visit      Cardiovascular and Mediastinum   RESOLVED: Hypertension - Primary   Relevant Medications   EPINEPHrine 0.3 mg/0.3 mL IJ SOAJ injection   Other Relevant Orders   CBC with Differential/Platelet   Comprehensive metabolic panel   Urinalysis, Routine w reflex microscopic     Other   Chest pain   Relevant Medications   Fluticasone-Salmeterol (ADVAIR DISKUS) 100-50 MCG/DOSE AEPB   escitalopram (LEXAPRO) 20 MG tablet   Chronic depression    Patient doing well with Lexapro. Uses rare lorazepam still has some leftover will call and needs for stormy date use.      Relevant Medications   escitalopram (LEXAPRO) 20 MG tablet   BMI 50.0-59.9, adult (HCC)    Discuss wt loss       Other Visit Diagnoses    Screening cholesterol level       Relevant Orders   Lipid panel   Thyroid disorder screen       Relevant Orders   TSH   Diabetes mellitus screening       Relevant Orders   Bayer DCA Hb A1c Waived       Follow up plan: Return in about 6 months (around 05/31/2017) for med check.

## 2016-11-28 NOTE — Assessment & Plan Note (Signed)
Discuss wt loss 

## 2016-11-29 ENCOUNTER — Telehealth: Payer: Self-pay | Admitting: Family Medicine

## 2016-11-29 LAB — CBC WITH DIFFERENTIAL/PLATELET
BASOS: 0 %
Basophils Absolute: 0 10*3/uL (ref 0.0–0.2)
EOS (ABSOLUTE): 0.3 10*3/uL (ref 0.0–0.4)
EOS: 5 %
HEMOGLOBIN: 11 g/dL — AB (ref 11.1–15.9)
Hematocrit: 33.6 % — ABNORMAL LOW (ref 34.0–46.6)
Immature Grans (Abs): 0 10*3/uL (ref 0.0–0.1)
Immature Granulocytes: 0 %
LYMPHS ABS: 1.6 10*3/uL (ref 0.7–3.1)
Lymphs: 25 %
MCH: 26.7 pg (ref 26.6–33.0)
MCHC: 32.7 g/dL (ref 31.5–35.7)
MCV: 82 fL (ref 79–97)
MONOCYTES: 6 %
MONOS ABS: 0.4 10*3/uL (ref 0.1–0.9)
NEUTROS ABS: 4 10*3/uL (ref 1.4–7.0)
Neutrophils: 64 %
Platelets: 241 10*3/uL (ref 150–379)
RBC: 4.12 x10E6/uL (ref 3.77–5.28)
RDW: 15.9 % — AB (ref 12.3–15.4)
WBC: 6.2 10*3/uL (ref 3.4–10.8)

## 2016-11-29 LAB — LIPID PANEL
CHOL/HDL RATIO: 2.1 ratio (ref 0.0–4.4)
CHOLESTEROL TOTAL: 150 mg/dL (ref 100–199)
HDL: 70 mg/dL (ref >39)
LDL CALC: 63 mg/dL (ref 0–99)
Triglycerides: 83 mg/dL (ref 0–149)
VLDL Cholesterol Cal: 17 mg/dL (ref 5–40)

## 2016-11-29 LAB — COMPREHENSIVE METABOLIC PANEL
A/G RATIO: 1.7 (ref 1.2–2.2)
ALBUMIN: 4.1 g/dL (ref 3.5–5.5)
ALT: 21 IU/L (ref 0–32)
AST: 28 IU/L (ref 0–40)
Alkaline Phosphatase: 68 IU/L (ref 39–117)
BUN / CREAT RATIO: 14 (ref 9–23)
BUN: 11 mg/dL (ref 6–24)
Bilirubin Total: 0.4 mg/dL (ref 0.0–1.2)
CO2: 22 mmol/L (ref 18–29)
CREATININE: 0.76 mg/dL (ref 0.57–1.00)
Calcium: 8.9 mg/dL (ref 8.7–10.2)
Chloride: 104 mmol/L (ref 96–106)
GFR calc Af Amer: 99 mL/min/{1.73_m2} (ref >59)
GFR, EST NON AFRICAN AMERICAN: 86 mL/min/{1.73_m2} (ref >59)
GLOBULIN, TOTAL: 2.4 g/dL (ref 1.5–4.5)
Glucose: 96 mg/dL (ref 65–99)
POTASSIUM: 4.7 mmol/L (ref 3.5–5.2)
SODIUM: 140 mmol/L (ref 134–144)
Total Protein: 6.5 g/dL (ref 6.0–8.5)

## 2016-11-29 LAB — TSH: TSH: 4.35 u[IU]/mL (ref 0.450–4.500)

## 2016-11-29 NOTE — Telephone Encounter (Signed)
Phone call Discussed with patient hemoglobin 10 point low patient not eating red meat discuss eating vitamins with iron and recheck CBC in a month or 2.

## 2017-01-22 ENCOUNTER — Other Ambulatory Visit: Payer: Self-pay | Admitting: Family Medicine

## 2017-01-22 ENCOUNTER — Telehealth: Payer: Self-pay | Admitting: Family Medicine

## 2017-01-22 NOTE — Telephone Encounter (Signed)
Discussed with patient patient will come back for CBC at sometime in the near future.

## 2017-01-22 NOTE — Telephone Encounter (Signed)
Patient was transferred to provider for telephone conversation.   

## 2017-01-22 NOTE — Telephone Encounter (Signed)
Please advise.   Pt is to come in for repeat CBC due to low hemoglobin, unsure if this is what she means when stating her "IRON". Lab Results  Component Value Date   WBC 6.2 11/28/2016   HGB 11.0 (L) 11/28/2016   HCT 33.6 (L) 11/28/2016   MCV 82 11/28/2016   PLT 241 11/28/2016

## 2017-01-22 NOTE — Telephone Encounter (Signed)
Does not appear to have been prescribed previously.

## 2017-01-22 NOTE — Telephone Encounter (Signed)
Call pt 

## 2017-01-30 ENCOUNTER — Other Ambulatory Visit: Payer: PRIVATE HEALTH INSURANCE

## 2017-01-30 DIAGNOSIS — D649 Anemia, unspecified: Secondary | ICD-10-CM

## 2017-01-31 ENCOUNTER — Encounter: Payer: Self-pay | Admitting: Family Medicine

## 2017-01-31 LAB — CBC WITH DIFFERENTIAL/PLATELET
BASOS ABS: 0 10*3/uL (ref 0.0–0.2)
Basos: 0 %
EOS (ABSOLUTE): 0.2 10*3/uL (ref 0.0–0.4)
Eos: 3 %
Hematocrit: 35.9 % (ref 34.0–46.6)
Hemoglobin: 11.5 g/dL (ref 11.1–15.9)
IMMATURE GRANS (ABS): 0 10*3/uL (ref 0.0–0.1)
IMMATURE GRANULOCYTES: 0 %
LYMPHS: 26 %
Lymphocytes Absolute: 1.9 10*3/uL (ref 0.7–3.1)
MCH: 27.1 pg (ref 26.6–33.0)
MCHC: 32 g/dL (ref 31.5–35.7)
MCV: 85 fL (ref 79–97)
MONOS ABS: 0.6 10*3/uL (ref 0.1–0.9)
Monocytes: 8 %
NEUTROS PCT: 63 %
Neutrophils Absolute: 4.6 10*3/uL (ref 1.4–7.0)
PLATELETS: 226 10*3/uL (ref 150–379)
RBC: 4.25 x10E6/uL (ref 3.77–5.28)
RDW: 15.5 % — AB (ref 12.3–15.4)
WBC: 7.4 10*3/uL (ref 3.4–10.8)

## 2017-06-05 ENCOUNTER — Ambulatory Visit: Payer: PRIVATE HEALTH INSURANCE | Admitting: Family Medicine

## 2017-07-26 ENCOUNTER — Ambulatory Visit: Payer: Self-pay | Admitting: *Deleted

## 2017-07-26 NOTE — Telephone Encounter (Signed)
Needs appt if not improving

## 2017-07-26 NOTE — Telephone Encounter (Signed)
Noted. Please see FYI from triage. Advise if other advise warranted.

## 2017-07-26 NOTE — Telephone Encounter (Signed)
Pt called regarding her cold symptoms. Had a cold since Christmas Day. Has a non productive cough and some nasal drainage.  No fever.  Home care advised. Pt voiced understanding.  Reason for Disposition . Cold with no complications  Answer Assessment - Initial Assessment Questions 1. ONSET: "When did the nasal discharge start?"      Had cold since Christmas 2. AMOUNT: "How much discharge is there?"      A lot 3. COUGH: "Do you have a cough?" If yes, ask: "Describe the color of your sputum" (clear, white, yellow, green)     Yes, sometimes yellow 4. RESPIRATORY DISTRESS: "Describe your breathing."      normal 5. FEVER: "Do you have a fever?" If so, ask: "What is your temperature, how was it measured, and when did it start?"     Low grade fever, 99.3 6. SEVERITY: "Overall, how bad are you feeling right now?" (e.g., doesn't interfere with normal activities, staying home from school/work, staying in bed)      tireness 7. OTHER SYMPTOMS: "Do you have any other symptoms?" (e.g., sore throat, earache, wheezing, vomiting)     Feels pressure in ears 8. PREGNANCY: "Is there any chance you are pregnant?" "When was your last menstrual period?"     n/a  Protocols used: COMMON COLD-A-AH

## 2017-07-30 ENCOUNTER — Ambulatory Visit (INDEPENDENT_AMBULATORY_CARE_PROVIDER_SITE_OTHER): Payer: PRIVATE HEALTH INSURANCE | Admitting: Family Medicine

## 2017-07-30 ENCOUNTER — Encounter: Payer: Self-pay | Admitting: Family Medicine

## 2017-07-30 VITALS — BP 141/84 | HR 65 | Temp 98.1°F | Wt 356.0 lb

## 2017-07-30 DIAGNOSIS — J019 Acute sinusitis, unspecified: Secondary | ICD-10-CM

## 2017-07-30 DIAGNOSIS — F329 Major depressive disorder, single episode, unspecified: Secondary | ICD-10-CM | POA: Diagnosis not present

## 2017-07-30 DIAGNOSIS — F32A Depression, unspecified: Secondary | ICD-10-CM

## 2017-07-30 MED ORDER — AMOXICILLIN-POT CLAVULANATE 875-125 MG PO TABS: 1.0000 | ORAL_TABLET | Freq: Two times a day (BID) | ORAL | 0 refills | Status: DC

## 2017-07-30 NOTE — Progress Notes (Signed)
BP (!) 141/84   Pulse 65   Temp 98.1 F (36.7 C) (Oral)   Wt (!) 356 lb (161.5 kg)   SpO2 98%   BMI 54.58 kg/m    Subjective:    Patient ID: Mary Hardin, female    DOB: 1956-04-11, 62 y.o.   MRN: [id number]  HPI: Mary Hardin is a 62 y.o. female  Chief Complaint  Patient presents with  . Cough    Congestions, Sore throat since Christmas  . Medication check  Patient for routine follow-up reflux and breathing which has been doing well except for as developed as head cold started Christmas day grandchildren a lot of facial pressure drainage congestion getting worse instead of better.  On out some bloody mucus from time to time Otherwise reflux and other medical issues stable  Relevant past medical, surgical, family and social history reviewed and updated as indicated. Interim medical history since our last visit reviewed. Allergies and medications reviewed and updated.  Review of Systems  Constitutional: Positive for diaphoresis and fatigue.  HENT: Positive for congestion, rhinorrhea and sinus pain.   Cardiovascular: Negative.     Per HPI unless specifically indicated above     Objective:    BP (!) 141/84   Pulse 65   Temp 98.1 F (36.7 C) (Oral)   Wt (!) 356 lb (161.5 kg)   SpO2 98%   BMI 54.58 kg/m   Wt Readings from Last 3 Encounters:  07/30/17 (!) 356 lb (161.5 kg)  11/28/16 (!) 356 lb (161.5 kg)  08/08/16 (!) 353 lb 8 oz (160.3 kg)    Physical Exam  Constitutional: She is oriented to person, place, and time. She appears well-developed and well-nourished.  HENT:  Head: Normocephalic and atraumatic.  Mouth/Throat: Oropharyngeal exudate present.  Eyes: Conjunctivae and EOM are normal.  Neck: Normal range of motion.  Cardiovascular: Normal rate, regular rhythm and normal heart sounds.  Pulmonary/Chest: Effort normal and breath sounds normal.  Musculoskeletal: Normal range of motion.  Neurological: She is alert and oriented to person, place, and  time.  Skin: No erythema.  Psychiatric: She has a normal mood and affect. Her behavior is normal. Judgment and thought content normal.    Results for orders placed or performed in visit on 01/30/17  CBC w/Diff  Result Value Ref Range   WBC 7.4 3.4 - 10.8 x10E3/uL   RBC 4.25 3.77 - 5.28 x10E6/uL   Hemoglobin 11.5 11.1 - 15.9 g/dL   Hematocrit 40.9 81.1 - 46.6 %   MCV 85 79 - 97 fL   MCH 27.1 26.6 - 33.0 pg   MCHC 32.0 31.5 - 35.7 g/dL   RDW 91.4 (H) 78.2 - 95.6 %   Platelets 226 150 - 379 x10E3/uL   Neutrophils 63 Not Estab. %   Lymphs 26 Not Estab. %   Monocytes 8 Not Estab. %   Eos 3 Not Estab. %   Basos 0 Not Estab. %   Neutrophils Absolute 4.6 1.4 - 7.0 x10E3/uL   Lymphocytes Absolute 1.9 0.7 - 3.1 x10E3/uL   Monocytes Absolute 0.6 0.1 - 0.9 x10E3/uL   EOS (ABSOLUTE) 0.2 0.0 - 0.4 x10E3/uL   Basophils Absolute 0.0 0.0 - 0.2 x10E3/uL   Immature Granulocytes 0 Not Estab. %   Immature Grans (Abs) 0.0 0.0 - 0.1 x10E3/uL      Assessment & Plan:   Problem List Items Addressed This Visit      Other   Chronic depression  The current medical regimen is effective;  continue present plan and medications.        Other Visit Diagnoses    Acute sinusitis, recurrence not specified, unspecified location    -  Primary   Relevant Medications   amoxicillin-clavulanate (AUGMENTIN) 875-125 MG tablet    Discussed sinusitis care and treatment use of Augmentin and OTC meds  Follow up plan: Return for Physical Exam, May.

## 2017-07-30 NOTE — Assessment & Plan Note (Signed)
The current medical regimen is effective;  continue present plan and medications.  

## 2017-08-01 ENCOUNTER — Other Ambulatory Visit: Payer: Self-pay | Admitting: Gastroenterology

## 2017-08-09 ENCOUNTER — Telehealth: Payer: Self-pay | Admitting: Family Medicine

## 2017-08-09 MED ORDER — CIPROFLOXACIN HCL 250 MG PO TABS: 250.0000 mg | ORAL_TABLET | Freq: Two times a day (BID) | ORAL | 0 refills | Status: DC

## 2017-08-09 NOTE — Telephone Encounter (Signed)
Pt aware.

## 2017-08-09 NOTE — Telephone Encounter (Signed)
done

## 2017-08-09 NOTE — Telephone Encounter (Signed)
Copied from CRM [phone]#[id number]. Topic: Quick Communication - See Telephone Encounter >> Aug 09, 2017  1:56 PM Landry MellowFoltz, Melissa J wrote: CRM for notification. See Telephone encounter for:   08/09/17. Pt would like to know if she can get abx for uti.  [facility] Cb# [phone]

## 2017-08-09 NOTE — Telephone Encounter (Signed)
Please advise. Should she be seen?

## 2017-08-30 ENCOUNTER — Encounter (HOSPITAL_COMMUNITY): Payer: Self-pay | Admitting: *Deleted

## 2017-08-30 ENCOUNTER — Other Ambulatory Visit: Payer: Self-pay

## 2017-08-30 NOTE — Progress Notes (Signed)
Pt denies SOB, chest pain, and being under the care of a cardiologist. Pt denies having a stress test and cardiac cath. Pt denies having an EKG and chest x ray within the last year. Pt denies recent labs. Pt stated that she does not take Aspirin and was instructed not to take iron. Pt made aware to stop taking vitamis, fish oil, Ginko, Glucomannan and herbal medications. Do not take any NSAIDs ie: Ibuprofen, Advil, Naproxen (Naprosyn/Aleve), Motrin, BC and Goody Powder. Pt verbalized understanding of all pre-op instructions.

## 2017-08-31 ENCOUNTER — Ambulatory Visit (HOSPITAL_COMMUNITY)
Admission: RE | Admit: 2017-08-31 | Discharge: 2017-08-31 | Disposition: A | Discharge status: Home / Self Care | Payer: PRIVATE HEALTH INSURANCE | Source: Ambulatory Visit | Attending: Gastroenterology | Admitting: Gastroenterology

## 2017-08-31 ENCOUNTER — Ambulatory Visit (HOSPITAL_COMMUNITY): Payer: PRIVATE HEALTH INSURANCE | Admitting: Anesthesiology

## 2017-08-31 ENCOUNTER — Encounter (HOSPITAL_COMMUNITY): Payer: Self-pay | Admitting: *Deleted

## 2017-08-31 ENCOUNTER — Encounter (HOSPITAL_COMMUNITY)
Admission: RE | Disposition: A | Discharge status: Home / Self Care | Payer: Self-pay | Source: Ambulatory Visit | Attending: Gastroenterology

## 2017-08-31 DIAGNOSIS — I10 Essential (primary) hypertension: Secondary | ICD-10-CM | POA: Diagnosis not present

## 2017-08-31 DIAGNOSIS — F419 Anxiety disorder, unspecified: Secondary | ICD-10-CM | POA: Diagnosis not present

## 2017-08-31 DIAGNOSIS — F329 Major depressive disorder, single episode, unspecified: Secondary | ICD-10-CM | POA: Insufficient documentation

## 2017-08-31 DIAGNOSIS — Z1211 Encounter for screening for malignant neoplasm of colon: Secondary | ICD-10-CM | POA: Insufficient documentation

## 2017-08-31 DIAGNOSIS — K219 Gastro-esophageal reflux disease without esophagitis: Secondary | ICD-10-CM | POA: Diagnosis not present

## 2017-08-31 DIAGNOSIS — K317 Polyp of stomach and duodenum: Secondary | ICD-10-CM | POA: Insufficient documentation

## 2017-08-31 DIAGNOSIS — G4730 Sleep apnea, unspecified: Secondary | ICD-10-CM | POA: Diagnosis not present

## 2017-08-31 DIAGNOSIS — J45909 Unspecified asthma, uncomplicated: Secondary | ICD-10-CM | POA: Insufficient documentation

## 2017-08-31 DIAGNOSIS — Z79899 Other long term (current) drug therapy: Secondary | ICD-10-CM | POA: Insufficient documentation

## 2017-08-31 DIAGNOSIS — Z8371 Family history of colonic polyps: Secondary | ICD-10-CM | POA: Insufficient documentation

## 2017-08-31 DIAGNOSIS — R51 Headache: Secondary | ICD-10-CM | POA: Insufficient documentation

## 2017-08-31 DIAGNOSIS — M1990 Unspecified osteoarthritis, unspecified site: Secondary | ICD-10-CM | POA: Insufficient documentation

## 2017-08-31 HISTORY — DX: Family history of other specified conditions: Z84.89

## 2017-08-31 HISTORY — PX: COLONOSCOPY: SHX5424

## 2017-08-31 HISTORY — DX: Unspecified osteoarthritis, unspecified site: M19.90

## 2017-08-31 HISTORY — PX: ESOPHAGOGASTRODUODENOSCOPY: SHX5428

## 2017-08-31 HISTORY — DX: Other specified postprocedural states: Z98.890

## 2017-08-31 HISTORY — DX: Fatty (change of) liver, not elsewhere classified: K76.0

## 2017-08-31 HISTORY — DX: Ventral hernia without obstruction or gangrene: K43.9

## 2017-08-31 HISTORY — DX: Nausea with vomiting, unspecified: R11.2

## 2017-08-31 HISTORY — DX: Headache: R51

## 2017-08-31 HISTORY — DX: Headache, unspecified: R51.9

## 2017-08-31 SURGERY — COLONOSCOPY
Anesthesia: Monitor Anesthesia Care

## 2017-08-31 MED ORDER — PROPOFOL 500 MG/50ML IV EMUL
INTRAVENOUS | Status: DC | PRN
  Administered 2017-08-31: 75 ug/kg/min via INTRAVENOUS

## 2017-08-31 MED ORDER — PROPOFOL 10 MG/ML IV BOLUS
INTRAVENOUS | Status: DC | PRN
  Administered 2017-08-31: 10 mg via INTRAVENOUS
  Administered 2017-08-31: 20 mg via INTRAVENOUS

## 2017-08-31 MED ORDER — LACTATED RINGERS IV SOLN
INTRAVENOUS | Status: AC | PRN
  Administered 2017-08-31: 1000 mL via INTRAVENOUS

## 2017-08-31 MED ORDER — LIDOCAINE HCL (CARDIAC) 20 MG/ML IV SOLN
INTRAVENOUS | Status: DC | PRN
  Administered 2017-08-31: 80 mg via INTRATRACHEAL

## 2017-08-31 MED ORDER — DEXMEDETOMIDINE HCL 200 MCG/2ML IV SOLN
INTRAVENOUS | Status: DC | PRN
  Administered 2017-08-31: 4 ug via INTRAVENOUS
  Administered 2017-08-31: 8 ug via INTRAVENOUS
  Administered 2017-08-31 (×7): 4 ug via INTRAVENOUS

## 2017-08-31 NOTE — H&P (Signed)
Patient is a 62 year old female who is here today for EGD and colonoscopy. The EGD is being done because of heartburn. The colonoscopy is being done because of a family history of colon polyps.  Physical:  No distress  Heart regular rhythm no murmurs  Lungs clear  Abdomen soft and nontender  Impression: #1 heartburn. #2 family history of colon polyps  Plan: EGD and colonoscopy

## 2017-08-31 NOTE — Op Note (Signed)
[facility] Patient Name: Mary Hardin Procedure Date : 08/31/2017 MRN: [id number] Attending MD: Graylin ShiverSalem F Ganem , MD Date of Birth: 1956/02/22 CSN: [id number] Age: [age] Admit Type: Outpatient Procedure:                Upper GI endoscopy Indications:              Heartburn Providers:                Graylin ShiverSalem F. Ganem, MD, Dwain SarnaPatricia Ford, RN, Mercy RidingKatie                            Lineback, Technician, Albertina SenegalZachary S. Beckner, CRNA Referring MD:              Medicines:                Propofol per Anesthesia Complications:            No immediate complications. Estimated Blood Loss:     Estimated blood loss was minimal. Procedure:                Pre-Anesthesia Assessment:                           - Prior to the procedure, a History and Physical                            was performed, and patient medications and                            allergies were reviewed. The patient's tolerance of                            previous anesthesia was also reviewed. The risks                            and benefits of the procedure and the sedation                            options and risks were discussed with the patient.                            All questions were answered, and informed consent                            was obtained. Prior Anticoagulants: The patient has                            taken no previous anticoagulant or antiplatelet                            agents. ASA Grade Assessment: III - A patient with                            severe systemic disease. After reviewing the risks  and benefits, the patient was deemed in                            satisfactory condition to undergo the procedure.                           After obtaining informed consent, the endoscope was                            passed under direct vision. Throughout the                            procedure, the patient's blood pressure, pulse, and   oxygen saturations were monitored continuously. The                            [id number] ([id number]) scope was introduced through the                            mouth, and advanced to the second part of duodenum.                            The upper GI endoscopy was accomplished without                            difficulty. The patient tolerated the procedure                            well. Scope In: Scope Out: Findings:      The examined esophagus was normal.      A single 5 mm sessile inflammatory polyp with surface erosion was found       in the gastric antrum. Biopsies were taken with a cold forceps for       histology.      The examined duodenum was normal. Impression:               - Normal esophagus.                           - A single gastric polyp. Biopsied.                           - Normal examined duodenum. Moderate Sedation:      . Recommendation:           - Resume regular diet.                           - Continue present medications.                           - Await pathology results. Procedure Code(s):        --- Professional ---                           [phone], Esophagogastroduodenoscopy, flexible,  transoral; with biopsy, single or multiple Diagnosis Code(s):        --- Professional ---                           K31.7, Polyp of stomach and duodenum                           R12, Heartburn CPT copyright 2016 [employer]. All rights reserved. The codes documented in this report are preliminary and upon coder review may  be revised to meet current compliance requirements. Graylin Shiver, MD 08/31/2017 9:50:30 AM This report has been signed electronically. Number of Addenda: 0

## 2017-08-31 NOTE — Anesthesia Preprocedure Evaluation (Addendum)
Anesthesia Evaluation  Patient identified by MRN, date of birth, ID band Patient awake    Reviewed: Allergy & Precautions, H&P , NPO status , Patient's Chart, lab work & pertinent test results  History of Anesthesia Complications (+) PONV  Airway Mallampati: III  TM Distance: >3 FB Neck ROM: Full    Dental no notable dental hx. (+) Teeth Intact, Dental Advisory Given   Pulmonary asthma , sleep apnea and Continuous Positive Airway Pressure Ventilation ,    Pulmonary exam normal breath sounds clear to auscultation       Cardiovascular hypertension,  Rhythm:Regular Rate:Normal     Neuro/Psych  Headaches, Anxiety Depression    GI/Hepatic Neg liver ROS, GERD  Medicated and Controlled,  Endo/Other  Morbid obesity  Renal/GU negative Renal ROS  negative genitourinary   Musculoskeletal  (+) Arthritis , Osteoarthritis,    Abdominal   Peds  Hematology negative hematology ROS (+)   Anesthesia Other Findings   Reproductive/Obstetrics negative OB ROS                            Anesthesia Physical Anesthesia Plan  ASA: III  Anesthesia Plan: MAC   Post-op Pain Management:    Induction: Intravenous  PONV Risk Score and Plan: 3 and Propofol infusion and Treatment may vary due to age or medical condition  Airway Management Planned: Nasal Cannula  Additional Equipment:   Intra-op Plan:   Post-operative Plan:   Informed Consent: I have reviewed the patients History and Physical, chart, labs and discussed the procedure including the risks, benefits and alternatives for the proposed anesthesia with the patient or authorized representative who has indicated his/her understanding and acceptance.   Dental advisory given  Plan Discussed with: CRNA  Anesthesia Plan Comments:         Anesthesia Quick Evaluation

## 2017-08-31 NOTE — Transfer of Care (Signed)
Immediate Anesthesia Transfer of Care Note  Patient: Mary Hardin  Procedure(s) Performed: COLONOSCOPY (N/A ) ESOPHAGOGASTRODUODENOSCOPY (EGD) (N/A )  Patient Location: Endoscopy Unit  Anesthesia Type:MAC  Level of Consciousness: awake, alert  and oriented  Airway & Oxygen Therapy: Patient Spontanous Breathing and Patient connected to nasal cannula oxygen  Post-op Assessment: Report given to RN and Post -op Vital signs reviewed and stable  Post vital signs: Reviewed and stable  Last Vitals:  Vitals:   08/31/17 0749  BP: (!) 151/77  Pulse: 65  Resp: 10  Temp: 37.1 C  SpO2: 98%    Last Pain:  Vitals:   08/31/17 0749  TempSrc: Oral         Complications: No apparent anesthesia complications

## 2017-08-31 NOTE — Discharge Instructions (Signed)

## 2017-08-31 NOTE — Op Note (Signed)
[facility] Patient Name: Mary Hardin Procedure Date : 08/31/2017 MRN: [id number] Attending MD: Graylin Shiver , MD Date of Birth: Nov 13, 1955 CSN: [id number] Age: 62 Admit Type: Outpatient Procedure:                Colonoscopy Indications:              Colon cancer screening in patient at increased                            risk: Family history of 1st-degree relative with                            colon polyps. Colonoscopy in 2008 and 2013 both                            normal. Providers:                Graylin Shiver, MD, Dwain Sarna, RN, Mercy Riding, Technician, Albertina Senegal. Beckner, CRNA Referring MD:              Medicines:                Propofol per Anesthesia Complications:            No immediate complications. Estimated Blood Loss:     . Procedure:                Pre-Anesthesia Assessment:                           - Prior to the procedure, a History and Physical                            was performed, and patient medications and                            allergies were reviewed. The patient's tolerance of                            previous anesthesia was also reviewed. The risks                            and benefits of the procedure and the sedation                            options and risks were discussed with the patient.                            All questions were answered, and informed consent                            was obtained. Prior Anticoagulants: The patient has  taken no previous anticoagulant or antiplatelet                            agents. ASA Grade Assessment: III - A patient with                            severe systemic disease. After reviewing the risks                            and benefits, the patient was deemed in                            satisfactory condition to undergo the procedure.                           - Prior to the procedure, a History and Physical                             was performed, and patient medications and                            allergies were reviewed. The patient's tolerance of                            previous anesthesia was also reviewed. The risks                            and benefits of the procedure and the sedation                            options and risks were discussed with the patient.                            All questions were answered, and informed consent                            was obtained. Prior Anticoagulants: The patient has                            taken no previous anticoagulant or antiplatelet                            agents. ASA Grade Assessment: III - A patient with                            severe systemic disease. After reviewing the risks                            and benefits, the patient was deemed in                            satisfactory condition to undergo the procedure.  After obtaining informed consent, the colonoscope                            was passed under direct vision. Throughout the                            procedure, the patient's blood pressure, pulse, and                            oxygen saturations were monitored continuously. The                            EC-[id number] ([id number]) scope was introduced through                            the anus and advanced to the the cecum, identified                            by appendiceal orifice and ileocecal valve. The                            ileocecal valve, appendiceal orifice, and rectum                            were photographed. The colonoscopy was performed                            without difficulty. The patient tolerated the                            procedure well. The quality of the bowel                            preparation was good. Scope In: 9:29:41 AM Scope Out: 9:44:15 AM Scope Withdrawal Time: 0 hours 6 minutes 41 seconds  Total Procedure Duration: 0 hours 14  minutes 34 seconds  Findings:      The perianal and digital rectal examinations were normal.      The colon (entire examined portion) appeared normal. Impression:               - The entire examined colon is normal.                           - No specimens collected. Moderate Sedation:      . Recommendation:           - Resume regular diet.                           - Continue present medications.                           - Repeat colonoscopy in 10 years for screening                            purposes. Since last 3 colonoscopies have  all been                            normal. Procedure Code(s):        --- Professional ---                           [phone], Colonoscopy, flexible; diagnostic, including                            collection of specimen(s) by brushing or washing,                            when performed (separate procedure) Diagnosis Code(s):        --- Professional ---                           Z83.71, Family history of colonic polyps CPT copyright 2016 [employer]. All rights reserved. The codes documented in this report are preliminary and upon coder review may  be revised to meet current compliance requirements. Graylin ShiverSalem F Shiro Ellerman, MD 08/31/2017 9:58:27 AM This report has been signed electronically. Number of Addenda: 0

## 2017-08-31 NOTE — Anesthesia Postprocedure Evaluation (Signed)
Anesthesia Post Note  Patient: Mary Hardin  Procedure(s) Performed: COLONOSCOPY (N/A ) ESOPHAGOGASTRODUODENOSCOPY (EGD) (N/A )     Patient location during evaluation: PACU Anesthesia Type: MAC Level of consciousness: awake and alert Pain management: pain level controlled Vital Signs Assessment: post-procedure vital signs reviewed and stable Respiratory status: spontaneous breathing, nonlabored ventilation and respiratory function stable Cardiovascular status: stable and blood pressure returned to baseline Postop Assessment: no apparent nausea or vomiting Anesthetic complications: no    Last Vitals:  Vitals:   08/31/17 1000 08/31/17 1010  BP: (!) 104/57 (!) 118/58  Pulse: (!) 55 (!) 56  Resp: 15 12  Temp:    SpO2: 100% 99%    Last Pain:  Vitals:   08/31/17 0749  TempSrc: Oral                 Dequane Strahan,W. EDMOND

## 2017-09-03 ENCOUNTER — Encounter (HOSPITAL_COMMUNITY): Payer: Self-pay | Admitting: Gastroenterology

## 2017-10-09 LAB — HM DEXA SCAN: HM Dexa Scan: NORMAL

## 2017-10-12 ENCOUNTER — Encounter: Payer: Self-pay | Admitting: Family Medicine

## 2017-11-21 ENCOUNTER — Telehealth: Payer: Self-pay | Admitting: Family Medicine

## 2017-11-21 NOTE — Telephone Encounter (Unsigned)
Copied from CRM [id number]. Topic: Quick Communication - See Telephone Encounter >> Nov 21, 2017  3:44 PM Floria Raveling A wrote: CRM for notification. See Telephone encounter for: 11/21/17. Pt called in and said that she was having some anxiety issue.  She said she got puppy, she said that the dog is getting on her nerves and wanted to know if the dr needs to adjust her meds?   She would like  nurse to give her a call back   Best number [phone]

## 2017-11-21 NOTE — Telephone Encounter (Signed)
Call pt 

## 2017-11-22 NOTE — Telephone Encounter (Signed)
Patient was transferred to provider for telephone conversation.   

## 2017-11-27 ENCOUNTER — Encounter: Payer: Self-pay | Admitting: Family Medicine

## 2017-11-27 ENCOUNTER — Ambulatory Visit (INDEPENDENT_AMBULATORY_CARE_PROVIDER_SITE_OTHER): Payer: PRIVATE HEALTH INSURANCE | Admitting: Family Medicine

## 2017-11-27 VITALS — BP 138/80 | HR 74 | Ht 67.75 in | Wt 345.0 lb

## 2017-11-27 DIAGNOSIS — G4733 Obstructive sleep apnea (adult) (pediatric): Secondary | ICD-10-CM | POA: Diagnosis not present

## 2017-11-27 DIAGNOSIS — R0789 Other chest pain: Secondary | ICD-10-CM

## 2017-11-27 DIAGNOSIS — I10 Essential (primary) hypertension: Secondary | ICD-10-CM | POA: Diagnosis not present

## 2017-11-27 DIAGNOSIS — F329 Major depressive disorder, single episode, unspecified: Secondary | ICD-10-CM | POA: Diagnosis not present

## 2017-11-27 DIAGNOSIS — Z1329 Encounter for screening for other suspected endocrine disorder: Secondary | ICD-10-CM

## 2017-11-27 DIAGNOSIS — Z6841 Body Mass Index (BMI) 40.0 and over, adult: Secondary | ICD-10-CM

## 2017-11-27 DIAGNOSIS — Z131 Encounter for screening for diabetes mellitus: Secondary | ICD-10-CM

## 2017-11-27 DIAGNOSIS — Z13220 Encounter for screening for lipoid disorders: Secondary | ICD-10-CM | POA: Diagnosis not present

## 2017-11-27 DIAGNOSIS — F32A Depression, unspecified: Secondary | ICD-10-CM

## 2017-11-27 DIAGNOSIS — Z0001 Encounter for general adult medical examination with abnormal findings: Secondary | ICD-10-CM | POA: Diagnosis not present

## 2017-11-27 LAB — URINALYSIS, ROUTINE W REFLEX MICROSCOPIC
Bilirubin, UA: NEGATIVE
Glucose, UA: NEGATIVE
LEUKOCYTES UA: NEGATIVE
Nitrite, UA: NEGATIVE
RBC, UA: NEGATIVE
SPEC GRAV UA: 1.025 (ref 1.005–1.030)
Urobilinogen, Ur: 1 mg/dL (ref 0.2–1.0)
pH, UA: 6 (ref 5.0–7.5)

## 2017-11-27 LAB — MICROSCOPIC EXAMINATION: BACTERIA UA: NONE SEEN

## 2017-11-27 MED ORDER — FLUTICASONE-SALMETEROL 100-50 MCG/DOSE IN AEPB: 1.0000 | INHALATION_SPRAY | Freq: Every day | RESPIRATORY_TRACT | 12 refills | Status: DC | PRN

## 2017-11-27 MED ORDER — ESCITALOPRAM OXALATE 20 MG PO TABS: 20.0000 mg | ORAL_TABLET | Freq: Every day | ORAL | 12 refills | Status: DC

## 2017-11-27 MED ORDER — NAPROXEN 500 MG PO TABS: 500.0000 mg | ORAL_TABLET | Freq: Two times a day (BID) | ORAL | 4 refills | Status: DC

## 2017-11-27 MED ORDER — ESOMEPRAZOLE MAGNESIUM 40 MG PO CPDR: 40.0000 mg | DELAYED_RELEASE_CAPSULE | Freq: Every day | ORAL | 12 refills | Status: DC

## 2017-11-27 MED ORDER — MUPIROCIN 2 % EX OINT: TOPICAL_OINTMENT | CUTANEOUS | 0 refills | Status: DC

## 2017-11-27 NOTE — Assessment & Plan Note (Signed)
The current medical regimen is effective;  continue present plan and medications.  

## 2017-11-27 NOTE — Progress Notes (Signed)
BP 138/80 (BP Location: Left Arm)   Pulse 74   Ht 5' 7.75" (1.721 m)   Wt (!) 345 lb (156.5 kg)   SpO2 98%   BMI 52.84 kg/m    Subjective:    Patient ID: Mary Hardin, female    DOB: [birth year], 62 y.o.   MRN: [id number]  HPI: Mary Hardin is a 62 y.o. female  Chief Complaint  Patient presents with  . Annual Exam   Patient all in all doing well had stressful time with new puppy that did not work out. Blood pressure doing well on medications was taking some blood pressure medications at one time but blood pressure got too low felt bad and is stopped.  Doing well now without spells or issues. Patient not taking lorazepam only took for 3 days and is doing well. Taking Lexapro 20 mg without problems and daily. Uses Advair inhaler as needed during allergy season.  Relevant past medical, surgical, family and social history reviewed and updated as indicated. Interim medical history since our last visit reviewed. Allergies and medications reviewed and updated.  Review of Systems  Constitutional: Negative.   HENT: Negative.   Eyes: Negative.   Respiratory: Negative.   Cardiovascular: Negative.   Gastrointestinal: Negative.   Endocrine: Negative.   Genitourinary: Negative.   Musculoskeletal: Negative.   Skin: Negative.   Allergic/Immunologic: Negative.   Neurological: Negative.   Hematological: Negative.   Psychiatric/Behavioral: Negative.     Per HPI unless specifically indicated above     Objective:    BP 138/80 (BP Location: Left Arm)   Pulse 74   Ht 5' 7.75" (1.721 m)   Wt (!) 345 lb (156.5 kg)   SpO2 98%   BMI 52.84 kg/m   Wt Readings from Last 3 Encounters:  11/27/17 (!) 345 lb (156.5 kg)  08/31/17 (!) 356 lb (161.5 kg)  07/30/17 (!) 356 lb (161.5 kg)    Physical Exam  Constitutional: She is oriented to person, place, and time. She appears well-developed and well-nourished.  HENT:  Head: Normocephalic and atraumatic.  Right Ear: External ear  normal.  Left Ear: External ear normal.  Nose: Nose normal.  Mouth/Throat: Oropharynx is clear and moist.  Eyes: Pupils are equal, round, and reactive to light. Conjunctivae and EOM are normal.  Neck: Normal range of motion. Neck supple. Carotid bruit is not present.  Cardiovascular: Normal rate, regular rhythm and normal heart sounds.  No murmur heard. Pulmonary/Chest: Effort normal and breath sounds normal.  Abdominal: Soft. Bowel sounds are normal. There is no hepatosplenomegaly.  Musculoskeletal: Normal range of motion.  Neurological: She is alert and oriented to person, place, and time.  Skin: No rash noted.  Psychiatric: She has a normal mood and affect. Her behavior is normal. Judgment and thought content normal.    Results for orders placed or performed in visit on 01/30/17  CBC w/Diff  Result Value Ref Range   WBC 7.4 3.4 - 10.8 x10E3/uL   RBC 4.25 3.77 - 5.28 x10E6/uL   Hemoglobin 11.5 11.1 - 15.9 g/dL   Hematocrit 78.2 95.6 - 46.6 %   MCV 85 79 - 97 fL   MCH 27.1 26.6 - 33.0 pg   MCHC 32.0 31.5 - 35.7 g/dL   RDW 21.3 (H) 08.6 - 57.8 %   Platelets 226 150 - 379 x10E3/uL   Neutrophils 63 Not Estab. %   Lymphs 26 Not Estab. %   Monocytes 8 Not Estab. %   Eos  3 Not Estab. %   Basos 0 Not Estab. %   Neutrophils Absolute 4.6 1.4 - 7.0 x10E3/uL   Lymphocytes Absolute 1.9 0.7 - 3.1 x10E3/uL   Monocytes Absolute 0.6 0.1 - 0.9 x10E3/uL   EOS (ABSOLUTE) 0.2 0.0 - 0.4 x10E3/uL   Basophils Absolute 0.0 0.0 - 0.2 x10E3/uL   Immature Granulocytes 0 Not Estab. %   Immature Grans (Abs) 0.0 0.0 - 0.1 x10E3/uL      Assessment & Plan:   Problem List Items Addressed This Visit      Respiratory   OSA (obstructive sleep apnea)    Using CPAP faithfully without problems.        Other   Chest pain   Relevant Medications   Fluticasone-Salmeterol (ADVAIR DISKUS) 100-50 MCG/DOSE AEPB   escitalopram (LEXAPRO) 20 MG tablet   Chronic depression    The current medical regimen is  effective;  continue present plan and medications.       Relevant Medications   escitalopram (LEXAPRO) 20 MG tablet   BMI 50.0-59.9, adult (HCC)    Discuss wt loss       Other Visit Diagnoses    Essential hypertension    -  Primary   Relevant Orders   CBC with Differential/Platelet   Comprehensive metabolic panel   Lipid panel   Urinalysis, Routine w reflex microscopic   Screening cholesterol level       Relevant Orders   CBC with Differential/Platelet   Comprehensive metabolic panel   Lipid panel   Urinalysis, Routine w reflex microscopic   Thyroid disorder screen       Relevant Orders   TSH   Diabetes mellitus screening       Relevant Orders   CBC with Differential/Platelet   Comprehensive metabolic panel   Urinalysis, Routine w reflex microscopic       Follow up plan: Return in about 6 months (around 05/30/2018).

## 2017-11-27 NOTE — Assessment & Plan Note (Signed)
Using CPAP faithfully without problems.

## 2017-11-27 NOTE — Assessment & Plan Note (Signed)
Discuss wt loss 

## 2017-11-28 ENCOUNTER — Encounter: Payer: Self-pay | Admitting: Family Medicine

## 2017-11-28 LAB — COMPREHENSIVE METABOLIC PANEL
A/G RATIO: 1.7 (ref 1.2–2.2)
ALBUMIN: 4.3 g/dL (ref 3.6–4.8)
ALT: 15 IU/L (ref 0–32)
AST: 13 IU/L (ref 0–40)
Alkaline Phosphatase: 57 IU/L (ref 39–117)
BILIRUBIN TOTAL: 0.3 mg/dL (ref 0.0–1.2)
BUN / CREAT RATIO: 14 (ref 12–28)
BUN: 13 mg/dL (ref 8–27)
CALCIUM: 9.2 mg/dL (ref 8.7–10.3)
CO2: 24 mmol/L (ref 20–29)
Chloride: 102 mmol/L (ref 96–106)
Creatinine, Ser: 0.9 mg/dL (ref 0.57–1.00)
GFR, EST AFRICAN AMERICAN: 80 mL/min/{1.73_m2} (ref >59)
GFR, EST NON AFRICAN AMERICAN: 70 mL/min/{1.73_m2} (ref >59)
Globulin, Total: 2.6 g/dL (ref 1.5–4.5)
Glucose: 124 mg/dL — ABNORMAL HIGH (ref 65–99)
POTASSIUM: 4.1 mmol/L (ref 3.5–5.2)
Sodium: 141 mmol/L (ref 134–144)
TOTAL PROTEIN: 6.9 g/dL (ref 6.0–8.5)

## 2017-11-28 LAB — CBC WITH DIFFERENTIAL/PLATELET
Basophils Absolute: 0 10*3/uL (ref 0.0–0.2)
Basos: 0 %
EOS (ABSOLUTE): 0.1 10*3/uL (ref 0.0–0.4)
EOS: 1 %
HEMATOCRIT: 37.6 % (ref 34.0–46.6)
HEMOGLOBIN: 12.4 g/dL (ref 11.1–15.9)
IMMATURE GRANULOCYTES: 0 %
Immature Grans (Abs): 0 10*3/uL (ref 0.0–0.1)
Lymphocytes Absolute: 1.4 10*3/uL (ref 0.7–3.1)
Lymphs: 19 %
MCH: 28.8 pg (ref 26.6–33.0)
MCHC: 33 g/dL (ref 31.5–35.7)
MCV: 87 fL (ref 79–97)
MONOCYTES: 6 %
Monocytes Absolute: 0.5 10*3/uL (ref 0.1–0.9)
NEUTROS ABS: 5.6 10*3/uL (ref 1.4–7.0)
NEUTROS PCT: 74 %
PLATELETS: 229 10*3/uL (ref 150–379)
RBC: 4.31 x10E6/uL (ref 3.77–5.28)
RDW: 14.2 % (ref 12.3–15.4)
WBC: 7.5 10*3/uL (ref 3.4–10.8)

## 2017-11-28 LAB — LIPID PANEL
CHOL/HDL RATIO: 1.9 ratio (ref 0.0–4.4)
Cholesterol, Total: 129 mg/dL (ref 100–199)
HDL: 68 mg/dL (ref >39)
LDL Calculated: 47 mg/dL (ref 0–99)
Triglycerides: 68 mg/dL (ref 0–149)
VLDL CHOLESTEROL CAL: 14 mg/dL (ref 5–40)

## 2017-11-28 LAB — TSH: TSH: 2.39 u[IU]/mL (ref 0.450–4.500)

## 2018-05-30 ENCOUNTER — Ambulatory Visit: Payer: PRIVATE HEALTH INSURANCE | Admitting: Family Medicine

## 2018-06-11 ENCOUNTER — Encounter: Payer: Self-pay | Admitting: Family Medicine

## 2018-06-11 ENCOUNTER — Ambulatory Visit (INDEPENDENT_AMBULATORY_CARE_PROVIDER_SITE_OTHER): Payer: PRIVATE HEALTH INSURANCE | Admitting: Family Medicine

## 2018-06-11 VITALS — BP 141/83 | HR 73 | Temp 98.3°F | Ht 67.5 in | Wt 359.0 lb

## 2018-06-11 DIAGNOSIS — Z23 Encounter for immunization: Secondary | ICD-10-CM

## 2018-06-11 DIAGNOSIS — F32A Depression, unspecified: Secondary | ICD-10-CM

## 2018-06-11 DIAGNOSIS — F329 Major depressive disorder, single episode, unspecified: Secondary | ICD-10-CM | POA: Diagnosis not present

## 2018-06-11 DIAGNOSIS — F419 Anxiety disorder, unspecified: Secondary | ICD-10-CM | POA: Diagnosis not present

## 2018-06-11 DIAGNOSIS — G4733 Obstructive sleep apnea (adult) (pediatric): Secondary | ICD-10-CM | POA: Diagnosis not present

## 2018-06-11 NOTE — Progress Notes (Signed)
BP (!) 141/83   Pulse 73   Temp 98.3 F (36.8 C) (Oral)   Ht 5' 7.5" (1.715 m)   Wt (!) 359 lb (162.8 kg)   SpO2 96%   BMI 55.40 kg/m    Subjective:    Patient ID: Mary Hardin, female    DOB: 1956-03-22, 62 y.o.   MRN: [id number]  HPI: Mary Hardin is a 62 y.o. female  Chief Complaint  Patient presents with  . Depression  Patient all in all is doing well had been on Lexapro 20 mg with her puppy now puppy is gone and is back on 10 mg Patient with a great deal of stressors taking care of her mother with multiple health issues and her husband with stage IV cancer. Patient is doing adequate on Lexapro 20 mg half a tablet and on sometimes takes 20 mg. Breathing is doing okay not using Advair. Reflux other conditions are stable.   Relevant past medical, surgical, family and social history reviewed and updated as indicated. Interim medical history since our last visit reviewed. Allergies and medications reviewed and updated.  Review of Systems  Constitutional: Negative.   Respiratory: Negative.   Cardiovascular: Negative.     Per HPI unless specifically indicated above     Objective:    BP (!) 141/83   Pulse 73   Temp 98.3 F (36.8 C) (Oral)   Ht 5' 7.5" (1.715 m)   Wt (!) 359 lb (162.8 kg)   SpO2 96%   BMI 55.40 kg/m   Wt Readings from Last 3 Encounters:  06/11/18 (!) 359 lb (162.8 kg)  11/27/17 (!) 345 lb (156.5 kg)  08/31/17 (!) 356 lb (161.5 kg)    Physical Exam  Constitutional: She is oriented to person, place, and time. She appears well-developed and well-nourished.  HENT:  Head: Normocephalic and atraumatic.  Eyes: Conjunctivae and EOM are normal.  Neck: Normal range of motion.  Cardiovascular: Normal rate, regular rhythm and normal heart sounds.  Pulmonary/Chest: Effort normal and breath sounds normal.  Musculoskeletal: Normal range of motion.  Neurological: She is alert and oriented to person, place, and time.  Skin: No erythema.    Psychiatric: She has a normal mood and affect. Her behavior is normal. Judgment and thought content normal.    Results for orders placed or performed in visit on 11/27/17  Microscopic Examination  Result Value Ref Range   WBC, UA 0-5 0 - 5 /hpf   RBC, UA 0-2 0 - 2 /hpf   Epithelial Cells (non renal) 0-10 0 - 10 /hpf   Casts Present None seen /lpf   Cast Type Hyaline casts N/A   Mucus, UA Present Not Estab.   Bacteria, UA None seen None seen/Few  CBC with Differential/Platelet  Result Value Ref Range   WBC 7.5 3.4 - 10.8 x10E3/uL   RBC 4.31 3.77 - 5.28 x10E6/uL   Hemoglobin 12.4 11.1 - 15.9 g/dL   Hematocrit 40.9 81.1 - 46.6 %   MCV 87 79 - 97 fL   MCH 28.8 26.6 - 33.0 pg   MCHC 33.0 31.5 - 35.7 g/dL   RDW 91.4 78.2 - 95.6 %   Platelets 229 150 - 379 x10E3/uL   Neutrophils 74 Not Estab. %   Lymphs 19 Not Estab. %   Monocytes 6 Not Estab. %   Eos 1 Not Estab. %   Basos 0 Not Estab. %   Neutrophils Absolute 5.6 1.4 - 7.0 x10E3/uL   Lymphocytes  Absolute 1.4 0.7 - 3.1 x10E3/uL   Monocytes Absolute 0.5 0.1 - 0.9 x10E3/uL   EOS (ABSOLUTE) 0.1 0.0 - 0.4 x10E3/uL   Basophils Absolute 0.0 0.0 - 0.2 x10E3/uL   Immature Granulocytes 0 Not Estab. %   Immature Grans (Abs) 0.0 0.0 - 0.1 x10E3/uL  Comprehensive metabolic panel  Result Value Ref Range   Glucose 124 (H) 65 - 99 mg/dL   BUN 13 8 - 27 mg/dL   Creatinine, Ser 7.820.90 0.57 - 1.00 mg/dL   GFR calc non Af Amer 70 >59 mL/min/1.73   GFR calc Af Amer 80 >59 mL/min/1.73   BUN/Creatinine Ratio 14 12 - 28   Sodium 141 134 - 144 mmol/L   Potassium 4.1 3.5 - 5.2 mmol/L   Chloride 102 96 - 106 mmol/L   CO2 24 20 - 29 mmol/L   Calcium 9.2 8.7 - 10.3 mg/dL   Total Protein 6.9 6.0 - 8.5 g/dL   Albumin 4.3 3.6 - 4.8 g/dL   Globulin, Total 2.6 1.5 - 4.5 g/dL   Albumin/Globulin Ratio 1.7 1.2 - 2.2   Bilirubin Total 0.3 0.0 - 1.2 mg/dL   Alkaline Phosphatase 57 39 - 117 IU/L   AST 13 0 - 40 IU/L   ALT 15 0 - 32 IU/L  Lipid panel   Result Value Ref Range   Cholesterol, Total 129 100 - 199 mg/dL   Triglycerides 68 0 - 149 mg/dL   HDL 68 >95>39 mg/dL   VLDL Cholesterol Cal 14 5 - 40 mg/dL   LDL Calculated 47 0 - 99 mg/dL   Chol/HDL Ratio 1.9 0.0 - 4.4 ratio  TSH  Result Value Ref Range   TSH 2.390 0.450 - 4.500 uIU/mL  Urinalysis, Routine w reflex microscopic  Result Value Ref Range   Specific Gravity, UA 1.025 1.005 - 1.030   pH, UA 6.0 5.0 - 7.5   Color, UA Orange Yellow   Appearance Ur Cloudy (A) Clear   Leukocytes, UA Negative Negative   Protein, UA 1+ (A) Negative/Trace   Glucose, UA Negative Negative   Ketones, UA 2+ (A) Negative   RBC, UA Negative Negative   Bilirubin, UA Negative Negative   Urobilinogen, Ur 1.0 0.2 - 1.0 mg/dL   Nitrite, UA Negative Negative   Microscopic Examination See below:       Assessment & Plan:   Problem List Items Addressed This Visit      Respiratory   OSA (obstructive sleep apnea)    Uses CPAP        Other   Chronic depression    The current medical regimen is effective;  continue present plan and medications.       Anxiety    The current medical regimen is effective;  continue present plan and medications.        Other Visit Diagnoses    Need for influenza vaccination    -  Primary   Relevant Orders   Flu Vaccine QUAD 36+ mos IM (Completed)       Follow up plan: Return in about 6 months (around 12/10/2018) for Physical Exam.

## 2018-06-11 NOTE — Assessment & Plan Note (Signed)
The current medical regimen is effective;  continue present plan and medications.  

## 2018-06-11 NOTE — Assessment & Plan Note (Signed)
Uses CPAP 

## 2018-06-11 NOTE — Patient Instructions (Signed)

## 2018-12-23 ENCOUNTER — Other Ambulatory Visit: Payer: Self-pay | Admitting: Family Medicine

## 2018-12-23 DIAGNOSIS — R0789 Other chest pain: Secondary | ICD-10-CM

## 2018-12-23 NOTE — Telephone Encounter (Signed)
Routing to provider for medication refill review. Patient's LOV: 05/2018

## 2019-01-14 ENCOUNTER — Other Ambulatory Visit: Payer: Self-pay | Admitting: Obstetrics and Gynecology

## 2019-01-14 DIAGNOSIS — Z803 Family history of malignant neoplasm of breast: Secondary | ICD-10-CM

## 2019-01-30 ENCOUNTER — Other Ambulatory Visit: Payer: PRIVATE HEALTH INSURANCE

## 2019-02-13 ENCOUNTER — Ambulatory Visit
Admission: RE | Admit: 2019-02-13 | Discharge: 2019-02-13 | Disposition: A | Discharge status: Home / Self Care | Payer: PRIVATE HEALTH INSURANCE | Source: Ambulatory Visit | Attending: Obstetrics and Gynecology | Admitting: Obstetrics and Gynecology

## 2019-02-13 ENCOUNTER — Other Ambulatory Visit: Payer: Self-pay

## 2019-02-13 DIAGNOSIS — Z803 Family history of malignant neoplasm of breast: Secondary | ICD-10-CM

## 2019-02-13 MED ORDER — GADOBUTROL 1 MMOL/ML IV SOLN
10.0000 mL | Freq: Once | INTRAVENOUS | Status: AC | PRN
  Administered 2019-02-13: 10 mL via INTRAVENOUS

## 2019-04-24 ENCOUNTER — Ambulatory Visit (INDEPENDENT_AMBULATORY_CARE_PROVIDER_SITE_OTHER): Payer: PRIVATE HEALTH INSURANCE | Admitting: Family Medicine

## 2019-04-24 ENCOUNTER — Encounter: Payer: Self-pay | Admitting: Family Medicine

## 2019-04-24 ENCOUNTER — Other Ambulatory Visit: Payer: Self-pay

## 2019-04-24 DIAGNOSIS — G4730 Sleep apnea, unspecified: Secondary | ICD-10-CM

## 2019-04-24 DIAGNOSIS — F329 Major depressive disorder, single episode, unspecified: Secondary | ICD-10-CM

## 2019-04-24 DIAGNOSIS — K219 Gastro-esophageal reflux disease without esophagitis: Secondary | ICD-10-CM

## 2019-04-24 DIAGNOSIS — K551 Chronic vascular disorders of intestine: Secondary | ICD-10-CM

## 2019-04-24 DIAGNOSIS — F32A Depression, unspecified: Secondary | ICD-10-CM

## 2019-04-24 DIAGNOSIS — F419 Anxiety disorder, unspecified: Secondary | ICD-10-CM | POA: Diagnosis not present

## 2019-04-24 DIAGNOSIS — R110 Nausea: Secondary | ICD-10-CM

## 2019-04-24 MED ORDER — LORAZEPAM 1 MG PO TABS: 1.0000 mg | ORAL_TABLET | Freq: Every day | ORAL | 1 refills | Status: DC | PRN

## 2019-04-24 NOTE — Assessment & Plan Note (Signed)
Worsening symptoms that are relieved with Nexium 40 mg a day

## 2019-04-24 NOTE — Assessment & Plan Note (Signed)
Stable as possible takes Lexapro 20 mg a day

## 2019-04-24 NOTE — Assessment & Plan Note (Signed)
Uses CPAP faithfully with resolution of daytime drowsiness

## 2019-04-24 NOTE — Progress Notes (Signed)
There were no vitals taken for this visit.   Subjective:    Patient ID: Mary Hardin, female    DOB: 1956/02/19, 63 y.o.   MRN: [id number]  HPI: Mary Hardin is a 63 y.o. female  Med check Patient concerned has had nausea symptoms over the last year and now getting worse has been bothered by reflux which is also getting worse taking Nexium every day which does help control her symptoms but continues with nausea especially after eating. Reviewed colonoscopy which is normal Patient's family history is BRCA positive and the patient.  Her brother has extensive metastatic abdominal cancer. Patient taking Lexapro 20 mg a day now because of the additional stress had been taking 10 mg. Takes Nexium 1 a day Naprosyn 1 a day has not needed Advair or albuterol wants some lorazepam to have on standby for marked stress which is going to be coming up.  Relevant past medical, surgical, family and social history reviewed and updated as indicated. Interim medical history since our last visit reviewed. Allergies and medications reviewed and updated.  Review of Systems  Constitutional: Negative.   HENT: Negative.   Eyes: Negative.   Respiratory: Negative.   Cardiovascular: Negative.   Gastrointestinal: Negative.   Endocrine: Negative.   Genitourinary: Negative.   Musculoskeletal: Negative.   Skin: Negative.   Allergic/Immunologic: Negative.   Neurological: Negative.   Hematological: Negative.   Psychiatric/Behavioral: Negative.     Per HPI unless specifically indicated above     Objective:    There were no vitals taken for this visit.  Wt Readings from Last 3 Encounters:  06/11/18 (!) 359 lb (162.8 kg)  11/27/17 (!) 345 lb (156.5 kg)  08/31/17 (!) 356 lb (161.5 kg)    Physical Exam  Results for orders placed or performed in visit on 11/27/17  Microscopic Examination   URINE  Result Value Ref Range   WBC, UA 0-5 0 - 5 /hpf   RBC, UA 0-2 0 - 2 /hpf   Epithelial Cells (non  renal) 0-10 0 - 10 /hpf   Casts Present None seen /lpf   Cast Type Hyaline casts N/A   Mucus, UA Present Not Estab.   Bacteria, UA None seen None seen/Few  CBC with Differential/Platelet  Result Value Ref Range   WBC 7.5 3.4 - 10.8 x10E3/uL   RBC 4.31 3.77 - 5.28 x10E6/uL   Hemoglobin 12.4 11.1 - 15.9 g/dL   Hematocrit 37.6 34.0 - 46.6 %   MCV 87 79 - 97 fL   MCH 28.8 26.6 - 33.0 pg   MCHC 33.0 31.5 - 35.7 g/dL   RDW 14.2 12.3 - 15.4 %   Platelets 229 150 - 379 x10E3/uL   Neutrophils 74 Not Estab. %   Lymphs 19 Not Estab. %   Monocytes 6 Not Estab. %   Eos 1 Not Estab. %   Basos 0 Not Estab. %   Neutrophils Absolute 5.6 1.4 - 7.0 x10E3/uL   Lymphocytes Absolute 1.4 0.7 - 3.1 x10E3/uL   Monocytes Absolute 0.5 0.1 - 0.9 x10E3/uL   EOS (ABSOLUTE) 0.1 0.0 - 0.4 x10E3/uL   Basophils Absolute 0.0 0.0 - 0.2 x10E3/uL   Immature Granulocytes 0 Not Estab. %   Immature Grans (Abs) 0.0 0.0 - 0.1 x10E3/uL  Comprehensive metabolic panel  Result Value Ref Range   Glucose 124 (H) 65 - 99 mg/dL   BUN 13 8 - 27 mg/dL   Creatinine, Ser 0.90 0.57 - 1.00 mg/dL  GFR calc non Af Amer 70 >59 mL/min/1.73   GFR calc Af Amer 80 >59 mL/min/1.73   BUN/Creatinine Ratio 14 12 - 28   Sodium 141 134 - 144 mmol/L   Potassium 4.1 3.5 - 5.2 mmol/L   Chloride 102 96 - 106 mmol/L   CO2 24 20 - 29 mmol/L   Calcium 9.2 8.7 - 10.3 mg/dL   Total Protein 6.9 6.0 - 8.5 g/dL   Albumin 4.3 3.6 - 4.8 g/dL   Globulin, Total 2.6 1.5 - 4.5 g/dL   Albumin/Globulin Ratio 1.7 1.2 - 2.2   Bilirubin Total 0.3 0.0 - 1.2 mg/dL   Alkaline Phosphatase 57 39 - 117 IU/L   AST 13 0 - 40 IU/L   ALT 15 0 - 32 IU/L  Lipid panel  Result Value Ref Range   Cholesterol, Total 129 100 - 199 mg/dL   Triglycerides 68 0 - 149 mg/dL   HDL 68 >39 mg/dL   VLDL Cholesterol Cal 14 5 - 40 mg/dL   LDL Calculated 47 0 - 99 mg/dL   Chol/HDL Ratio 1.9 0.0 - 4.4 ratio  TSH  Result Value Ref Range   TSH 2.390 0.450 - 4.500 uIU/mL   Urinalysis, Routine w reflex microscopic  Result Value Ref Range   Specific Gravity, UA 1.025 1.005 - 1.030   pH, UA 6.0 5.0 - 7.5   Color, UA Orange Yellow   Appearance Ur Cloudy (A) Clear   Leukocytes, UA Negative Negative   Protein, UA 1+ (A) Negative/Trace   Glucose, UA Negative Negative   Ketones, UA 2+ (A) Negative   RBC, UA Negative Negative   Bilirubin, UA Negative Negative   Urobilinogen, Ur 1.0 0.2 - 1.0 mg/dL   Nitrite, UA Negative Negative   Microscopic Examination See below:       Assessment & Plan:   Problem List Items Addressed This Visit      Respiratory   Apnea, sleep    Uses CPAP faithfully with resolution of daytime drowsiness        Digestive   Acid reflux    Worsening symptoms that are relieved with Nexium 40 mg a day      Relevant Orders   Comprehensive metabolic panel   CBC with Differential/Platelet   TSH   Urinalysis, Routine w reflex microscopic     Other   Chronic depression    Stable as possible takes Lexapro 20 mg a day      Relevant Medications   LORazepam (ATIVAN) 1 MG tablet   Other Relevant Orders   Comprehensive metabolic panel   CBC with Differential/Platelet   TSH   Urinalysis, Routine w reflex microscopic   Anxiety    Worsening anxiety with pending death in her family      Relevant Medications   LORazepam (ATIVAN) 1 MG tablet   Nausea    Ongoing worsening chronic nausea will perform some imaging studies      Relevant Orders   Comprehensive metabolic panel   Lipid panel   CBC with Differential/Platelet   TSH   Urinalysis, Routine w reflex microscopic    Other Visit Diagnoses    Chronic vascular disorder of intestine (HCC)       Relevant Orders   Comprehensive metabolic panel   Lipid panel   CT Abdomen Pelvis W Contrast      Telemedicine using audio/video telecommunications for a synchronous communication visit. Today's visit due to COVID-19 isolation precautions I connected with and verified that I  am  speaking with the correct person using two identifiers.   I discussed the limitations, risks, security and privacy concerns of performing an evaluation and management service by telecommunication and the availability of in person appointments. I also discussed with the patient that there may be a patient responsible charge related to this service. The patient expressed understanding and agreed to proceed. The patient's location is home. I am at home.   I discussed the assessment and treatment plan with the patient. The patient was provided an opportunity to ask questions and all were answered. The patient agreed with the plan and demonstrated an understanding of the instructions.   The patient was advised to call back or seek an in-person evaluation if the symptoms worsen or if the condition fails to improve as anticipated.   I provided 21+ minutes of time during this encounter. Follow up plan: Return if symptoms worsen or fail to improve.

## 2019-04-24 NOTE — Assessment & Plan Note (Signed)
Ongoing worsening chronic nausea will perform some imaging studies

## 2019-04-24 NOTE — Assessment & Plan Note (Signed)
Worsening anxiety with pending death in her family

## 2019-04-30 ENCOUNTER — Telehealth: Payer: Self-pay | Admitting: Family Medicine

## 2019-04-30 ENCOUNTER — Other Ambulatory Visit: Payer: Self-pay | Admitting: Family Medicine

## 2019-04-30 ENCOUNTER — Other Ambulatory Visit: Payer: PRIVATE HEALTH INSURANCE

## 2019-04-30 ENCOUNTER — Other Ambulatory Visit: Payer: Self-pay

## 2019-04-30 ENCOUNTER — Ambulatory Visit (INDEPENDENT_AMBULATORY_CARE_PROVIDER_SITE_OTHER): Payer: PRIVATE HEALTH INSURANCE

## 2019-04-30 DIAGNOSIS — Z803 Family history of malignant neoplasm of breast: Secondary | ICD-10-CM

## 2019-04-30 DIAGNOSIS — F329 Major depressive disorder, single episode, unspecified: Secondary | ICD-10-CM

## 2019-04-30 DIAGNOSIS — Z23 Encounter for immunization: Secondary | ICD-10-CM | POA: Diagnosis not present

## 2019-04-30 DIAGNOSIS — K551 Chronic vascular disorders of intestine: Secondary | ICD-10-CM

## 2019-04-30 DIAGNOSIS — K219 Gastro-esophageal reflux disease without esophagitis: Secondary | ICD-10-CM

## 2019-04-30 DIAGNOSIS — R110 Nausea: Secondary | ICD-10-CM

## 2019-04-30 DIAGNOSIS — F32A Depression, unspecified: Secondary | ICD-10-CM

## 2019-04-30 LAB — URINALYSIS, ROUTINE W REFLEX MICROSCOPIC
Bilirubin, UA: NEGATIVE
Glucose, UA: NEGATIVE
Ketones, UA: NEGATIVE
Leukocytes,UA: NEGATIVE
Nitrite, UA: NEGATIVE
Protein,UA: NEGATIVE
RBC, UA: NEGATIVE
Specific Gravity, UA: 1.015 (ref 1.005–1.030)
Urobilinogen, Ur: 0.2 mg/dL (ref 0.2–1.0)
pH, UA: 7 (ref 5.0–7.5)

## 2019-04-30 NOTE — Telephone Encounter (Signed)
Erroneous entry

## 2019-04-30 NOTE — Telephone Encounter (Signed)
Verdis Frederickson from the Radiology Dept    Call back [phone]   Scheduling dept [phone]   Called in stating they sent over the incorrect test and that needs to be cancelled. She needs CT angio pelvis test

## 2019-04-30 NOTE — Telephone Encounter (Signed)
Caller name: Mary  Relation to pt: Radiology Department  Call back number: [phone]    Reason for call: orders needs to state CT ANGIO ABDOMEN PELVIS. (Rep states dont click cancel but change and if unable to click change please call directly)

## 2019-05-01 ENCOUNTER — Other Ambulatory Visit: Payer: Self-pay | Admitting: Family Medicine

## 2019-05-01 ENCOUNTER — Encounter: Payer: Self-pay | Admitting: Family Medicine

## 2019-05-01 LAB — COMPREHENSIVE METABOLIC PANEL
ALT: 13 IU/L (ref 0–32)
AST: 19 IU/L (ref 0–40)
Albumin/Globulin Ratio: 1.6 (ref 1.2–2.2)
Albumin: 4.2 g/dL (ref 3.8–4.8)
Alkaline Phosphatase: 77 IU/L (ref 39–117)
BUN/Creatinine Ratio: 12 (ref 12–28)
BUN: 11 mg/dL (ref 8–27)
Bilirubin Total: 0.2 mg/dL (ref 0.0–1.2)
CO2: 25 mmol/L (ref 20–29)
Calcium: 9.2 mg/dL (ref 8.7–10.3)
Chloride: 99 mmol/L (ref 96–106)
Creatinine, Ser: 0.9 mg/dL (ref 0.57–1.00)
GFR calc Af Amer: 80 mL/min/{1.73_m2} (ref >59)
GFR calc non Af Amer: 69 mL/min/{1.73_m2} (ref >59)
Globulin, Total: 2.6 g/dL (ref 1.5–4.5)
Glucose: 66 mg/dL (ref 65–99)
Potassium: 4.5 mmol/L (ref 3.5–5.2)
Sodium: 141 mmol/L (ref 134–144)
Total Protein: 6.8 g/dL (ref 6.0–8.5)

## 2019-05-01 LAB — CBC WITH DIFFERENTIAL/PLATELET
Basophils Absolute: 0 10*3/uL (ref 0.0–0.2)
Basos: 1 %
EOS (ABSOLUTE): 0.3 10*3/uL (ref 0.0–0.4)
Eos: 4 %
Hematocrit: 38.2 % (ref 34.0–46.6)
Hemoglobin: 12.2 g/dL (ref 11.1–15.9)
Immature Grans (Abs): 0 10*3/uL (ref 0.0–0.1)
Immature Granulocytes: 0 %
Lymphocytes Absolute: 1.8 10*3/uL (ref 0.7–3.1)
Lymphs: 26 %
MCH: 28.6 pg (ref 26.6–33.0)
MCHC: 31.9 g/dL (ref 31.5–35.7)
MCV: 90 fL (ref 79–97)
Monocytes Absolute: 0.5 10*3/uL (ref 0.1–0.9)
Monocytes: 7 %
Neutrophils Absolute: 4.5 10*3/uL (ref 1.4–7.0)
Neutrophils: 62 %
Platelets: 270 10*3/uL (ref 150–450)
RBC: 4.27 x10E6/uL (ref 3.77–5.28)
RDW: 13.4 % (ref 11.7–15.4)
WBC: 7.1 10*3/uL (ref 3.4–10.8)

## 2019-05-01 LAB — LIPID PANEL
Chol/HDL Ratio: 2.7 ratio (ref 0.0–4.4)
Cholesterol, Total: 162 mg/dL (ref 100–199)
HDL: 61 mg/dL (ref >39)
LDL Chol Calc (NIH): 79 mg/dL (ref 0–99)
Triglycerides: 127 mg/dL (ref 0–149)
VLDL Cholesterol Cal: 22 mg/dL (ref 5–40)

## 2019-05-01 LAB — CEA: CEA: 0.8 ng/mL (ref 0.0–4.7)

## 2019-05-01 LAB — TSH: TSH: 4.08 u[IU]/mL (ref 0.450–4.500)

## 2019-05-01 NOTE — Progress Notes (Signed)
CT order changed per radiology request

## 2019-05-09 ENCOUNTER — Ambulatory Visit: Admission: RE | Admit: 2019-05-09 | Payer: PRIVATE HEALTH INSURANCE | Source: Ambulatory Visit

## 2019-05-13 ENCOUNTER — Other Ambulatory Visit: Payer: Self-pay

## 2019-05-13 ENCOUNTER — Ambulatory Visit
Admission: RE | Admit: 2019-05-13 | Discharge: 2019-05-13 | Disposition: A | Discharge status: Home / Self Care | Payer: PRIVATE HEALTH INSURANCE | Source: Ambulatory Visit | Attending: Family Medicine | Admitting: Family Medicine

## 2019-05-13 DIAGNOSIS — K551 Chronic vascular disorders of intestine: Secondary | ICD-10-CM | POA: Diagnosis present

## 2019-05-13 MED ORDER — IOHEXOL 350 MG/ML SOLN
100.0000 mL | Freq: Once | INTRAVENOUS | Status: AC | PRN
  Administered 2019-05-13: 100 mL via INTRAVENOUS

## 2019-05-14 ENCOUNTER — Encounter: Payer: Self-pay | Admitting: Family Medicine

## 2019-05-14 DIAGNOSIS — I700 Atherosclerosis of aorta: Secondary | ICD-10-CM | POA: Insufficient documentation

## 2019-07-30 ENCOUNTER — Telehealth: Payer: Self-pay | Admitting: Family Medicine

## 2019-07-30 ENCOUNTER — Encounter: Payer: Self-pay | Admitting: Family Medicine

## 2019-07-30 NOTE — Telephone Encounter (Signed)
Copied from CRM [id number]. Topic: Appointment Scheduling - Scheduling Inquiry for Clinic >> Jul 29, 2019  3:18 PM Mary Hardin wrote: Reason for CRM: pt called and stated that she would like a call back from the nurse regarding when should come back in for a follow up visit. Please advise

## 2019-07-30 NOTE — Telephone Encounter (Signed)
Letter sent through mychart and printed to mail 

## 2019-07-30 NOTE — Telephone Encounter (Signed)
LVM to make the appointment around April or sooner.

## 2019-07-30 NOTE — Telephone Encounter (Signed)
Appears to have been last seen in October. Needs to be seen 6 months at the latest- April. We can see her sooner if need be

## 2019-08-20 ENCOUNTER — Ambulatory Visit: Payer: PRIVATE HEALTH INSURANCE | Admitting: Family Medicine

## 2019-09-04 ENCOUNTER — Other Ambulatory Visit: Payer: Self-pay

## 2019-09-04 MED ORDER — NAPROXEN 500 MG PO TABS: 500.0000 mg | ORAL_TABLET | Freq: Two times a day (BID) | ORAL | 3 refills | Status: DC

## 2019-09-04 NOTE — Telephone Encounter (Signed)
Refill request for Naproxen LOV: 04/24/19 Next Appt: 10/27/19

## 2019-10-27 ENCOUNTER — Other Ambulatory Visit: Payer: Self-pay

## 2019-10-27 ENCOUNTER — Ambulatory Visit (INDEPENDENT_AMBULATORY_CARE_PROVIDER_SITE_OTHER): Payer: PRIVATE HEALTH INSURANCE | Admitting: Family Medicine

## 2019-10-27 ENCOUNTER — Encounter: Payer: Self-pay | Admitting: Family Medicine

## 2019-10-27 VITALS — BP 152/88 | HR 62 | Ht 68.11 in | Wt 353.0 lb

## 2019-10-27 DIAGNOSIS — Z6841 Body Mass Index (BMI) 40.0 and over, adult: Secondary | ICD-10-CM | POA: Diagnosis not present

## 2019-10-27 DIAGNOSIS — R030 Elevated blood-pressure reading, without diagnosis of hypertension: Secondary | ICD-10-CM | POA: Diagnosis not present

## 2019-10-27 DIAGNOSIS — Z114 Encounter for screening for human immunodeficiency virus [HIV]: Secondary | ICD-10-CM

## 2019-10-27 DIAGNOSIS — F339 Major depressive disorder, recurrent, unspecified: Secondary | ICD-10-CM

## 2019-10-27 DIAGNOSIS — I700 Atherosclerosis of aorta: Secondary | ICD-10-CM

## 2019-10-27 DIAGNOSIS — F419 Anxiety disorder, unspecified: Secondary | ICD-10-CM | POA: Diagnosis not present

## 2019-10-27 LAB — BAYER DCA HB A1C WAIVED: HB A1C (BAYER DCA - WAIVED): 5.4 % (ref <7.0)

## 2019-10-27 LAB — UA/M W/RFLX CULTURE, ROUTINE
Bilirubin, UA: NEGATIVE
Glucose, UA: NEGATIVE
Ketones, UA: NEGATIVE
Leukocytes,UA: NEGATIVE
Nitrite, UA: NEGATIVE
Protein,UA: NEGATIVE
RBC, UA: NEGATIVE
Specific Gravity, UA: 1.02 (ref 1.005–1.030)
Urobilinogen, Ur: 0.2 mg/dL (ref 0.2–1.0)
pH, UA: 7 (ref 5.0–7.5)

## 2019-10-27 LAB — MICROALBUMIN, URINE WAIVED
Creatinine, Urine Waived: 50 mg/dL (ref 10–300)
Microalb, Ur Waived: 10 mg/L (ref 0–19)

## 2019-10-27 MED ORDER — MUPIROCIN 2 % EX OINT: TOPICAL_OINTMENT | CUTANEOUS | 0 refills | Status: DC

## 2019-10-27 MED ORDER — LORAZEPAM 1 MG PO TABS: 1.0000 mg | ORAL_TABLET | Freq: Every day | ORAL | 0 refills | Status: DC | PRN

## 2019-10-27 NOTE — Progress Notes (Signed)
BP (!) 152/88 (BP Location: Left Arm, Cuff Size: Large)   Pulse 62   Ht 5' 8.11" (1.73 m)   Wt (!) 353 lb (160.1 kg)   SpO2 98%   BMI 53.50 kg/m    Subjective:    Patient ID: Mary Hardin, female    DOB: 11-23-1955, 64 y.o.   MRN: [id number]  HPI: Mary Hardin is a 64 y.o. female who presents today to change providers after her previous provider retired.   Chief Complaint  Patient presents with  . Depression  . Anxiety    Refill on Lorazepam  . Medication Refill    Bactroban   ANXIETY/DEPRESSION- her husband has stopped his chemo and she is under a lot a stress. Takes her lorazepam very occasionally Duration:exacerbated Anxious mood: yes  Excessive worrying: yes Irritability: no  Sweating: no Nausea: no Palpitations:no Hyperventilation: no Panic attacks: no Agoraphobia: no  Obscessions/compulsions: no Depressed mood: no Depression screen [facility] 2/9 10/27/2019 06/11/2018 11/27/2017 07/30/2017 05/16/2016  Decreased Interest 0 0 0 0 0  Down, Depressed, Hopeless 0 0 0 0 0  PHQ - 2 Score 0 0 0 0 0  Altered sleeping 0 0 - - -  Tired, decreased energy 0 0 - - -  Change in appetite 0 0 - - -  Feeling bad or failure about yourself  0 0 - - -  Trouble concentrating 0 0 - - -  Moving slowly or fidgety/restless 0 0 - - -  Suicidal thoughts 0 0 - - -  PHQ-9 Score 0 0 - - -  Difficult doing work/chores Not difficult at all - - - -   GAD 7 : Generalized Anxiety Score 10/27/2019 05/16/2016  Nervous, Anxious, on Edge 0 1  Control/stop worrying 0 0  Worry too much - different things 0 0  Trouble relaxing 0 0  Restless 0 1  Easily annoyed or irritable 0 0  Afraid - awful might happen 0 1  Total GAD 7 Score 0 3  Anxiety Difficulty Not difficult at all Not difficult at all   Anhedonia: no Weight changes: no Insomnia: no   Hypersomnia: no Fatigue/loss of energy: yes Feelings of worthlessness: no Feelings of guilt: no Impaired concentration/indecisiveness: no Suicidal  ideations: no  Crying spells: no Recent Stressors/Life Changes: yes   Relationship problems: no   Family stress: yes     Financial stress: no    Job stress: no    Recent death/loss: no  Was having some chest pain when she was weighing 20lbs more. This has gotten better sine she's lost the weight. She is not having SOB with activity now. She has seen cardiology about 5 years ago and had a normal exam.   ELEVATED BLOOD PRESSURE Duration of elevated BP: unknown BP monitoring frequency: not checking Previous BP meds: no Recent stressors: yes Family history of hypertension: yes Recurrent headaches: no Visual changes: no Palpitations: no  Dyspnea: no Chest pain: no Lower extremity edema: no Dizzy/lightheaded: no Transient ischemic attacks: no  Relevant past medical, surgical, family and social history reviewed and updated as indicated. Interim medical history since our last visit reviewed. Allergies and medications reviewed and updated.  Review of Systems  Constitutional: Negative.   Respiratory: Negative.   Cardiovascular: Negative.   Gastrointestinal: Negative.   Musculoskeletal: Negative.   Neurological: Negative.   Psychiatric/Behavioral: Negative.     Per HPI unless specifically indicated above     Objective:    BP (!) 152/88 (BP  Location: Left Arm, Cuff Size: Large)   Pulse 62   Ht 5' 8.11" (1.73 m)   Wt (!) 353 lb (160.1 kg)   SpO2 98%   BMI 53.50 kg/m   Wt Readings from Last 3 Encounters:  10/27/19 (!) 353 lb (160.1 kg)  06/11/18 (!) 359 lb (162.8 kg)  11/27/17 (!) 345 lb (156.5 kg)    Physical Exam Vitals and nursing note reviewed.  Constitutional:      General: She is not in acute distress.    Appearance: Normal appearance. She is obese. She is not ill-appearing, toxic-appearing or diaphoretic.  HENT:     Head: Normocephalic and atraumatic.     Right Ear: External ear normal.     Left Ear: External ear normal.     Nose: Nose normal.      Mouth/Throat:     Mouth: Mucous membranes are moist.     Pharynx: Oropharynx is clear.  Eyes:     General: No scleral icterus.       Right eye: No discharge.        Left eye: No discharge.     Extraocular Movements: Extraocular movements intact.     Conjunctiva/sclera: Conjunctivae normal.     Pupils: Pupils are equal, round, and reactive to light.  Cardiovascular:     Rate and Rhythm: Normal rate and regular rhythm.     Pulses: Normal pulses.     Heart sounds: Normal heart sounds. No murmur. No friction rub. No gallop.   Pulmonary:     Effort: Pulmonary effort is normal. No respiratory distress.     Breath sounds: Normal breath sounds. No stridor. No wheezing, rhonchi or rales.  Chest:     Chest wall: No tenderness.  Musculoskeletal:        General: Normal range of motion.     Cervical back: Normal range of motion and neck supple.  Skin:    General: Skin is warm and dry.     Capillary Refill: Capillary refill takes less than 2 seconds.     Coloration: Skin is not jaundiced or pale.     Findings: No bruising, erythema, lesion or rash.  Neurological:     General: No focal deficit present.     Mental Status: She is alert and oriented to person, place, and time. Mental status is at baseline.  Psychiatric:        Mood and Affect: Mood normal.        Behavior: Behavior normal.        Thought Content: Thought content normal.        Judgment: Judgment normal.     Results for orders placed or performed in visit on 04/30/19  Urinalysis, Routine w reflex microscopic  Result Value Ref Range   Specific Gravity, UA 1.015 1.005 - 1.030   pH, UA 7.0 5.0 - 7.5   Color, UA Yellow Yellow   Appearance Ur Clear Clear   Leukocytes,UA Negative Negative   Protein,UA Negative Negative/Trace   Glucose, UA Negative Negative   Ketones, UA Negative Negative   RBC, UA Negative Negative   Bilirubin, UA Negative Negative   Urobilinogen, Ur 0.2 0.2 - 1.0 mg/dL   Nitrite, UA Negative Negative    TSH  Result Value Ref Range   TSH 4.080 0.450 - 4.500 uIU/mL  CBC with Differential/Platelet  Result Value Ref Range   WBC 7.1 3.4 - 10.8 x10E3/uL   RBC 4.27 3.77 - 5.28 x10E6/uL   Hemoglobin 12.2 11.1 -  15.9 g/dL   Hematocrit 38.2 34.0 - 46.6 %   MCV 90 79 - 97 fL   MCH 28.6 26.6 - 33.0 pg   MCHC 31.9 31.5 - 35.7 g/dL   RDW 13.4 11.7 - 15.4 %   Platelets 270 150 - 450 x10E3/uL   Neutrophils 62 Not Estab. %   Lymphs 26 Not Estab. %   Monocytes 7 Not Estab. %   Eos 4 Not Estab. %   Basos 1 Not Estab. %   Neutrophils Absolute 4.5 1.4 - 7.0 x10E3/uL   Lymphocytes Absolute 1.8 0.7 - 3.1 x10E3/uL   Monocytes Absolute 0.5 0.1 - 0.9 x10E3/uL   EOS (ABSOLUTE) 0.3 0.0 - 0.4 x10E3/uL   Basophils Absolute 0.0 0.0 - 0.2 x10E3/uL   Immature Granulocytes 0 Not Estab. %   Immature Grans (Abs) 0.0 0.0 - 0.1 x10E3/uL  Lipid panel  Result Value Ref Range   Cholesterol, Total 162 100 - 199 mg/dL   Triglycerides 127 0 - 149 mg/dL   HDL 61 >39 mg/dL   VLDL Cholesterol Cal 22 5 - 40 mg/dL   LDL Chol Calc (NIH) 79 0 - 99 mg/dL   Chol/HDL Ratio 2.7 0.0 - 4.4 ratio  Comprehensive metabolic panel  Result Value Ref Range   Glucose 66 65 - 99 mg/dL   BUN 11 8 - 27 mg/dL   Creatinine, Ser 0.90 0.57 - 1.00 mg/dL   GFR calc non Af Amer 69 >59 mL/min/1.73   GFR calc Af Amer 80 >59 mL/min/1.73   BUN/Creatinine Ratio 12 12 - 28   Sodium 141 134 - 144 mmol/L   Potassium 4.5 3.5 - 5.2 mmol/L   Chloride 99 96 - 106 mmol/L   CO2 25 20 - 29 mmol/L   Calcium 9.2 8.7 - 10.3 mg/dL   Total Protein 6.8 6.0 - 8.5 g/dL   Albumin 4.2 3.8 - 4.8 g/dL   Globulin, Total 2.6 1.5 - 4.5 g/dL   Albumin/Globulin Ratio 1.6 1.2 - 2.2   Bilirubin Total 0.2 0.0 - 1.2 mg/dL   Alkaline Phosphatase 77 39 - 117 IU/L   AST 19 0 - 40 IU/L   ALT 13 0 - 32 IU/L  CEA  Result Value Ref Range   CEA 0.8 0.0 - 4.7 ng/mL      Assessment & Plan:   Problem List Items Addressed This Visit      Cardiovascular and Mediastinum    Atherosclerosis of aorta (HCC)    Will keep BP and cholesterol under good control. Labs drawn today. Await results.       Relevant Orders   CBC with Differential/Platelet   Comprehensive metabolic panel   Lipid Panel w/o Chol/HDL Ratio     Other   Depression, recurrent (HCC)    Under good control on current regimen. Continue current regimen. Continue to monitor. Call with any concerns. Refills up to date. Call with any concerns.        Relevant Medications   LORazepam (ATIVAN) 1 MG tablet   BMI 50.0-59.9, adult ([place])    Congratulated patient on her 20lb weight loss. Continue diet and exercise with goal of losing 1-2lbs per week. Call with any concerns.       Relevant Orders   Bayer DCA Hb A1c Waived   Anxiety    Uses lorazepam every occasionally. 30 pills should last a year. Call with any concerns. Refill given.       Relevant Medications   LORazepam (ATIVAN) 1 MG  tablet    Other Visit Diagnoses    Elevated blood pressure reading without diagnosis of hypertension    -  Primary   Will work on the [employer] and recheck 1 month. Call with any concerns. Labs drawn today. Await results.    Relevant Orders   CBC with Differential/Platelet   Comprehensive metabolic panel   Microalbumin, Urine Waived   TSH   UA/M w/rflx Culture, Routine   Screening for HIV without presence of risk factors       Labs drawn today. Await results.    Relevant Orders   HIV Antibody (routine testing w rflx)   Morbid obesity (HCC)       Congratulated patient on 20lb weight loss. Encouraged diet and exercise with goal of losing 1-2lbs per week.        Follow up plan: Return 1 month physical.

## 2019-10-27 NOTE — Assessment & Plan Note (Signed)
Uses lorazepam every occasionally. 30 pills should last a year. Call with any concerns. Refill given.

## 2019-10-27 NOTE — Assessment & Plan Note (Signed)
Under good control on current regimen. Continue current regimen. Continue to monitor. Call with any concerns. Refills up to date. Call with any concerns.    

## 2019-10-27 NOTE — Assessment & Plan Note (Signed)
Will keep BP and cholesterol under good control. Labs drawn today. Await results.  °

## 2019-10-27 NOTE — Assessment & Plan Note (Signed)
Congratulated patient on her 20lb weight loss. Continue diet and exercise with goal of losing 1-2lbs per week. Call with any concerns.

## 2019-10-27 NOTE — Patient Instructions (Signed)
DASH Eating Plan DASH stands for "Dietary Approaches to Stop Hypertension." The DASH eating plan is a healthy eating plan that has been shown to reduce high blood pressure (hypertension). It may also reduce your risk for type 2 diabetes, heart disease, and stroke. The DASH eating plan may also help with weight loss. What are tips for following this plan?  General guidelines  Avoid eating more than 2,300 mg (milligrams) of salt (sodium) a day. If you have hypertension, you may need to reduce your sodium intake to 1,500 mg a day.  Limit alcohol intake to no more than 1 drink a day for nonpregnant women and 2 drinks a day for men. One drink equals 12 oz of beer, 5 oz of wine, or 1 oz of hard liquor.  Work with your health care provider to maintain a healthy body weight or to lose weight. Ask what an ideal weight is for you.  Get at least 30 minutes of exercise that causes your heart to beat faster (aerobic exercise) most days of the week. Activities may include walking, swimming, or biking.  Work with your health care provider or diet and nutrition specialist (dietitian) to adjust your eating plan to your individual calorie needs. Reading food labels   Check food labels for the amount of sodium per serving. Choose foods with less than 5 percent of the Daily Value of sodium. Generally, foods with less than 300 mg of sodium per serving fit into this eating plan.  To find whole grains, look for the word "whole" as the first word in the ingredient list. Shopping  Buy products labeled as "low-sodium" or "no salt added."  Buy fresh foods. Avoid canned foods and premade or frozen meals. Cooking  Avoid adding salt when cooking. Use salt-free seasonings or herbs instead of table salt or sea salt. Check with your health care provider or pharmacist before using salt substitutes.  Do not fry foods. Cook foods using healthy methods such as baking, boiling, grilling, and broiling instead.  Cook with  heart-healthy oils, such as olive, canola, soybean, or sunflower oil. Meal planning  Eat a balanced diet that includes: ? 5 or more servings of fruits and vegetables each day. At each meal, try to fill half of your plate with fruits and vegetables. ? Up to 6-8 servings of whole grains each day. ? Less than 6 oz of lean meat, poultry, or fish each day. A 3-oz serving of meat is about the same size as a deck of cards. One egg equals 1 oz. ? 2 servings of low-fat dairy each day. ? A serving of nuts, seeds, or beans 5 times each week. ? Heart-healthy fats. Healthy fats called Omega-3 fatty acids are found in foods such as flaxseeds and coldwater fish, like sardines, salmon, and mackerel.  Limit how much you eat of the following: ? Canned or prepackaged foods. ? Food that is high in trans fat, such as fried foods. ? Food that is high in saturated fat, such as fatty meat. ? Sweets, desserts, sugary drinks, and other foods with added sugar. ? Full-fat dairy products.  Do not salt foods before eating.  Try to eat at least 2 vegetarian meals each week.  Eat more home-cooked food and less restaurant, buffet, and fast food.  When eating at a restaurant, ask that your food be prepared with less salt or no salt, if possible. What foods are recommended? The items listed may not be a complete list. Talk with your dietitian about   what dietary choices are best for you. Grains Whole-grain or whole-wheat bread. Whole-grain or whole-wheat pasta. Brown rice. Oatmeal. Quinoa. Bulgur. Whole-grain and low-sodium cereals. Pita bread. Low-fat, low-sodium crackers. Whole-wheat flour tortillas. Vegetables Fresh or frozen vegetables (raw, steamed, roasted, or grilled). Low-sodium or reduced-sodium tomato and vegetable juice. Low-sodium or reduced-sodium tomato sauce and tomato paste. Low-sodium or reduced-sodium canned vegetables. Fruits All fresh, dried, or frozen fruit. Canned fruit in natural juice (without  added sugar). Meat and other protein foods Skinless chicken or turkey. Ground chicken or turkey. Pork with fat trimmed off. Fish and seafood. Egg whites. Dried beans, peas, or lentils. Unsalted nuts, nut butters, and seeds. Unsalted canned beans. Lean cuts of beef with fat trimmed off. Low-sodium, lean deli meat. Dairy Low-fat (1%) or fat-free (skim) milk. Fat-free, low-fat, or reduced-fat cheeses. Nonfat, low-sodium ricotta or cottage cheese. Low-fat or nonfat yogurt. Low-fat, low-sodium cheese. Fats and oils Soft margarine without trans fats. Vegetable oil. Low-fat, reduced-fat, or light mayonnaise and salad dressings (reduced-sodium). Canola, safflower, olive, soybean, and sunflower oils. Avocado. Seasoning and other foods Herbs. Spices. Seasoning mixes without salt. Unsalted popcorn and pretzels. Fat-free sweets. What foods are not recommended? The items listed may not be a complete list. Talk with your dietitian about what dietary choices are best for you. Grains Baked goods made with fat, such as croissants, muffins, or some breads. Dry pasta or rice meal packs. Vegetables Creamed or fried vegetables. Vegetables in a cheese sauce. Regular canned vegetables (not low-sodium or reduced-sodium). Regular canned tomato sauce and paste (not low-sodium or reduced-sodium). Regular tomato and vegetable juice (not low-sodium or reduced-sodium). Pickles. Olives. Fruits Canned fruit in a light or heavy syrup. Fried fruit. Fruit in cream or butter sauce. Meat and other protein foods Fatty cuts of meat. Ribs. Fried meat. Bacon. Sausage. Bologna and other processed lunch meats. Salami. Fatback. Hotdogs. Bratwurst. Salted nuts and seeds. Canned beans with added salt. Canned or smoked fish. Whole eggs or egg yolks. Chicken or turkey with skin. Dairy Whole or 2% milk, cream, and half-and-half. Whole or full-fat cream cheese. Whole-fat or sweetened yogurt. Full-fat cheese. Nondairy creamers. Whipped toppings.  Processed cheese and cheese spreads. Fats and oils Butter. Stick margarine. Lard. Shortening. Ghee. Bacon fat. Tropical oils, such as coconut, palm kernel, or palm oil. Seasoning and other foods Salted popcorn and pretzels. Onion salt, garlic salt, seasoned salt, table salt, and sea salt. Worcestershire sauce. Tartar sauce. Barbecue sauce. Teriyaki sauce. Soy sauce, including reduced-sodium. Steak sauce. Canned and packaged gravies. Fish sauce. Oyster sauce. Cocktail sauce. Horseradish that you find on the shelf. Ketchup. Mustard. Meat flavorings and tenderizers. Bouillon cubes. Hot sauce and Tabasco sauce. Premade or packaged marinades. Premade or packaged taco seasonings. Relishes. Regular salad dressings. Where to find more information:  National Heart, Lung, and Blood Institute: www.nhlbi.nih.gov  American Heart Association: www.heart.org Summary  The DASH eating plan is a healthy eating plan that has been shown to reduce high blood pressure (hypertension). It may also reduce your risk for type 2 diabetes, heart disease, and stroke.  With the DASH eating plan, you should limit salt (sodium) intake to 2,300 mg a day. If you have hypertension, you may need to reduce your sodium intake to 1,500 mg a day.  When on the DASH eating plan, aim to eat more fresh fruits and vegetables, whole grains, lean proteins, low-fat dairy, and heart-healthy fats.  Work with your health care provider or diet and nutrition specialist (dietitian) to adjust your eating plan to your   individual calorie needs. This information is not intended to replace advice given to you by your health care provider. Make sure you discuss any questions you have with your health care provider. Document Revised: 06/22/2017 Document Reviewed: 07/03/2016 Elsevier Patient Education  2020 Elsevier Inc.  

## 2019-10-28 LAB — LIPID PANEL W/O CHOL/HDL RATIO
Cholesterol, Total: 158 mg/dL (ref 100–199)
HDL: 61 mg/dL (ref >39)
LDL Chol Calc (NIH): 77 mg/dL (ref 0–99)
Triglycerides: 111 mg/dL (ref 0–149)
VLDL Cholesterol Cal: 20 mg/dL (ref 5–40)

## 2019-10-28 LAB — CBC WITH DIFFERENTIAL/PLATELET
Basophils Absolute: 0 10*3/uL (ref 0.0–0.2)
Basos: 1 %
EOS (ABSOLUTE): 0.2 10*3/uL (ref 0.0–0.4)
Eos: 3 %
Hematocrit: 38.4 % (ref 34.0–46.6)
Hemoglobin: 12.2 g/dL (ref 11.1–15.9)
Immature Grans (Abs): 0 10*3/uL (ref 0.0–0.1)
Immature Granulocytes: 0 %
Lymphocytes Absolute: 1.4 10*3/uL (ref 0.7–3.1)
Lymphs: 20 %
MCH: 27.4 pg (ref 26.6–33.0)
MCHC: 31.8 g/dL (ref 31.5–35.7)
MCV: 86 fL (ref 79–97)
Monocytes Absolute: 0.4 10*3/uL (ref 0.1–0.9)
Monocytes: 6 %
Neutrophils Absolute: 4.6 10*3/uL (ref 1.4–7.0)
Neutrophils: 70 %
Platelets: 251 10*3/uL (ref 150–450)
RBC: 4.45 x10E6/uL (ref 3.77–5.28)
RDW: 14.1 % (ref 11.7–15.4)
WBC: 6.7 10*3/uL (ref 3.4–10.8)

## 2019-10-28 LAB — COMPREHENSIVE METABOLIC PANEL
ALT: 9 IU/L (ref 0–32)
AST: 13 IU/L (ref 0–40)
Albumin/Globulin Ratio: 1.5 (ref 1.2–2.2)
Albumin: 4 g/dL (ref 3.8–4.8)
Alkaline Phosphatase: 62 IU/L (ref 39–117)
BUN/Creatinine Ratio: 20 (ref 12–28)
BUN: 15 mg/dL (ref 8–27)
Bilirubin Total: 0.3 mg/dL (ref 0.0–1.2)
CO2: 22 mmol/L (ref 20–29)
Calcium: 9.1 mg/dL (ref 8.7–10.3)
Chloride: 102 mmol/L (ref 96–106)
Creatinine, Ser: 0.74 mg/dL (ref 0.57–1.00)
GFR calc Af Amer: 100 mL/min/{1.73_m2} (ref >59)
GFR calc non Af Amer: 87 mL/min/{1.73_m2} (ref >59)
Globulin, Total: 2.7 g/dL (ref 1.5–4.5)
Glucose: 90 mg/dL (ref 65–99)
Potassium: 4.4 mmol/L (ref 3.5–5.2)
Sodium: 140 mmol/L (ref 134–144)
Total Protein: 6.7 g/dL (ref 6.0–8.5)

## 2019-10-28 LAB — TSH: TSH: 4.1 u[IU]/mL (ref 0.450–4.500)

## 2019-10-28 LAB — HIV ANTIBODY (ROUTINE TESTING W REFLEX): HIV Screen 4th Generation wRfx: NONREACTIVE

## 2019-11-20 ENCOUNTER — Other Ambulatory Visit: Payer: Self-pay

## 2019-11-20 DIAGNOSIS — R0789 Other chest pain: Secondary | ICD-10-CM

## 2019-11-20 NOTE — Telephone Encounter (Signed)
Pharmacy change. Patient last seen 10/27/19 and has appointment 11/28/19

## 2019-11-24 MED ORDER — ESCITALOPRAM OXALATE 20 MG PO TABS: 20.0000 mg | ORAL_TABLET | Freq: Every day | ORAL | 1 refills | Status: DC

## 2019-11-28 ENCOUNTER — Other Ambulatory Visit: Payer: Self-pay

## 2019-11-28 ENCOUNTER — Encounter: Payer: Self-pay | Admitting: Family Medicine

## 2019-11-28 ENCOUNTER — Ambulatory Visit (INDEPENDENT_AMBULATORY_CARE_PROVIDER_SITE_OTHER): Payer: PRIVATE HEALTH INSURANCE | Admitting: Family Medicine

## 2019-11-28 VITALS — BP 135/78 | HR 65 | Temp 98.6°F | Ht 68.11 in | Wt 343.6 lb

## 2019-11-28 DIAGNOSIS — Z0000 Encounter for general adult medical examination without abnormal findings: Secondary | ICD-10-CM

## 2019-11-28 DIAGNOSIS — F419 Anxiety disorder, unspecified: Secondary | ICD-10-CM | POA: Diagnosis not present

## 2019-11-28 NOTE — Assessment & Plan Note (Signed)
Her husband is on hospice. Reassured her that we will refill her lorazepam as needed. She should take it as she needs it. She will let us know if she needs anything. If she starts feeling more depressed, we will add wellbutrin.

## 2019-11-28 NOTE — Progress Notes (Signed)
BP 135/78 (BP Location: Left Arm, Patient Position: Sitting, Cuff Size: Normal)   Pulse 65   Temp 98.6 F (37 C) (Oral)   Ht 5' 8.11" (1.73 m)   Wt (!) 343 lb 9.6 oz (155.9 kg)   SpO2 99%   BMI 52.08 kg/m    Subjective:    Patient ID: Mary Hardin, female    DOB: Oct 25, 1955, 64 y.o.   MRN: [id number]  HPI: Mary Hardin is a 64 y.o. female presenting on 11/28/2019 for comprehensive medical examination. Current medical complaints include:  Husband is on hospice. Has been under a lot of stress.   She currently lives with: husband Menopausal Symptoms: no  Depression Screen done today and results listed below:  Depression screen [facility] 2/9 10/27/2019 06/11/2018 11/27/2017 07/30/2017 05/16/2016  Decreased Interest 0 0 0 0 0  Down, Depressed, Hopeless 0 0 0 0 0  PHQ - 2 Score 0 0 0 0 0  Altered sleeping 0 0 - - -  Tired, decreased energy 0 0 - - -  Change in appetite 0 0 - - -  Feeling bad or failure about yourself  0 0 - - -  Trouble concentrating 0 0 - - -  Moving slowly or fidgety/restless 0 0 - - -  Suicidal thoughts 0 0 - - -  PHQ-9 Score 0 0 - - -  Difficult doing work/chores Not difficult at all - - - -    Past Medical History:  Past Medical History:  Diagnosis Date  . Anemia   . Anxiety   . Arthritis   . Asthma   . Depression   . Family history of adverse reaction to anesthesia    Pt mother has PONV  . Family history of malignant neoplasm of breast   . Fatty liver   . GERD (gastroesophageal reflux disease)   . Headache   . Hernia of abdominal wall   . Hypertension   . PONV (postoperative nausea and vomiting)   . Sleep apnea    wears cpap    Surgical History:  Past Surgical History:  Procedure Laterality Date  . CHOLECYSTECTOMY    . COLONOSCOPY N/A 08/31/2017   Procedure: COLONOSCOPY;  Surgeon: Graylin Shiver, MD;  Location: [facility] ENDOSCOPY;  Service: Endoscopy;  Laterality: N/A;  . COLONOSCOPY W/ BIOPSIES AND POLYPECTOMY    . DILATION AND CURETTAGE OF  UTERUS     x2  . ESOPHAGOGASTRODUODENOSCOPY N/A 08/31/2017   Procedure: ESOPHAGOGASTRODUODENOSCOPY (EGD);  Surgeon: Graylin Shiver, MD;  Location: [facility] ENDOSCOPY;  Service: Endoscopy;  Laterality: N/A;  . EYE SURGERY     lasik  . VARICOSE VEIN SURGERY    . wisdom teeth extraction      Medications:  Current Outpatient Medications on File Prior to Visit  Medication Sig  . Cholecalciferol (VITAMIN D3) 5000 units CAPS Take 5,000 Units by mouth daily.  Marland Kitchen docusate sodium (COLACE) 100 MG capsule Take 100 mg by mouth daily.  Marland Kitchen escitalopram (LEXAPRO) 20 MG tablet Take 1 tablet (20 mg total) by mouth daily.  Marland Kitchen esomeprazole (NEXIUM) 40 MG capsule TAKE 1 CAPSULE BY MOUTH EVERY DAY  . LORazepam (ATIVAN) 1 MG tablet Take 1 tablet (1 mg total) by mouth daily as needed for anxiety.  . mupirocin ointment (BACTROBAN) 2 % APPLY TO AFFECTED AREAS 3 TIMES DAILY  . naproxen (NAPROSYN) 500 MG tablet Take 1 tablet (500 mg total) by mouth 2 (two) times daily with a meal.  . OVER THE  COUNTER MEDICATION Plexus Weight Loss   No current facility-administered medications on file prior to visit.    Allergies:  Allergies  Allergen Reactions  . Shellfish Allergy     swelling  . Shellfish-Derived Products     swelling    Social History:  Social History   Socioeconomic History  . Marital status: Married    Spouse name: Not on file  . Number of children: 0  . Years of education: Not on file  . Highest education level: Not on file  Occupational History  . Not on file  Tobacco Use  . Smoking status: Never Smoker  . Smokeless tobacco: Never Used  Substance and Sexual Activity  . Alcohol use: Yes    Comment: 1-4 glasses of wine per week  . Drug use: No  . Sexual activity: Not on file  Other Topics Concern  . Not on file  Social History Narrative  . Not on file   Social Determinants of Health   Financial Resource Strain:   . Difficulty of Paying Living Expenses:   Food Insecurity:   . Worried About  Programme researcher, broadcasting/film/video in the Last Year:   . Barista in the Last Year:   Transportation Needs:   . Freight forwarder (Medical):   Marland Kitchen Lack of Transportation (Non-Medical):   Physical Activity:   . Days of Exercise per Week:   . Minutes of Exercise per Session:   Stress:   . Feeling of Stress :   Social Connections:   . Frequency of Communication with Friends and Family:   . Frequency of Social Gatherings with Friends and Family:   . Attends Religious Services:   . Active Member of Clubs or Organizations:   . Attends Banker Meetings:   Marland Kitchen Marital Status:   Intimate Partner Violence:   . Fear of Current or Ex-Partner:   . Emotionally Abused:   Marland Kitchen Physically Abused:   . Sexually Abused:    Social History   Tobacco Use  Smoking Status Never Smoker  Smokeless Tobacco Never Used   Social History   Substance and Sexual Activity  Alcohol Use Yes   Comment: 1-4 glasses of wine per week    Family History:  Family History  Problem Relation Age of Onset  . Breast cancer Maternal Grandmother 71       deceased 23  . Diabetes Maternal Grandmother   . Pancreatic cancer Paternal Grandmother 58  . Breast cancer Maternal Aunt 55       currently 41  . Thyroid cancer Cousin        dx in her 72s  . Breast cancer Mother 52       DCIS; currently 27  . Hypertension Mother   . Arrhythmia Mother        A-fib  . Stroke Father   . Hyperlipidemia Father   . Seizures Father     Past medical history, surgical history, medications, allergies, family history and social history reviewed with patient today and changes made to appropriate areas of the chart.   Review of Systems  Constitutional: Negative.   HENT: Negative.   Eyes: Negative.   Respiratory: Negative.   Cardiovascular: Negative.   Gastrointestinal: Positive for heartburn. Negative for abdominal pain, blood in stool, constipation, diarrhea, melena, nausea and vomiting.  Genitourinary: Negative.     Musculoskeletal: Negative.   Skin: Negative.   Neurological: Negative.   Endo/Heme/Allergies: Negative.   Psychiatric/Behavioral: Positive for depression.  Negative for hallucinations, memory loss, substance abuse and suicidal ideas. The patient is nervous/anxious. The patient does not have insomnia.     All other ROS negative except what is listed above and in the HPI.      Objective:    BP 135/78 (BP Location: Left Arm, Patient Position: Sitting, Cuff Size: Normal)   Pulse 65   Temp 98.6 F (37 C) (Oral)   Ht 5' 8.11" (1.73 m)   Wt (!) 343 lb 9.6 oz (155.9 kg)   SpO2 99%   BMI 52.08 kg/m   Wt Readings from Last 3 Encounters:  11/28/19 (!) 343 lb 9.6 oz (155.9 kg)  10/27/19 (!) 353 lb (160.1 kg)  06/11/18 (!) 359 lb (162.8 kg)    Physical Exam Vitals and nursing note reviewed.  Constitutional:      General: She is not in acute distress.    Appearance: Normal appearance. She is obese. She is not ill-appearing, toxic-appearing or diaphoretic.  HENT:     Head: Normocephalic and atraumatic.     Right Ear: Tympanic membrane, ear canal and external ear normal. There is no impacted cerumen.     Left Ear: Tympanic membrane, ear canal and external ear normal. There is no impacted cerumen.     Nose: Nose normal. No congestion or rhinorrhea.     Mouth/Throat:     Mouth: Mucous membranes are moist.     Pharynx: Oropharynx is clear. No oropharyngeal exudate or posterior oropharyngeal erythema.  Eyes:     General: No scleral icterus.       Right eye: No discharge.        Left eye: No discharge.     Extraocular Movements: Extraocular movements intact.     Conjunctiva/sclera: Conjunctivae normal.     Pupils: Pupils are equal, round, and reactive to light.  Neck:     Vascular: No carotid bruit.  Cardiovascular:     Rate and Rhythm: Normal rate and regular rhythm.     Pulses: Normal pulses.     Heart sounds: No murmur. No friction rub. No gallop.   Pulmonary:     Effort:  Pulmonary effort is normal. No respiratory distress.     Breath sounds: Normal breath sounds. No stridor. No wheezing, rhonchi or rales.  Chest:     Chest wall: No tenderness.  Abdominal:     General: Abdomen is flat. Bowel sounds are normal. There is no distension.     Palpations: Abdomen is soft. There is no mass.     Tenderness: There is no abdominal tenderness. There is no right CVA tenderness, left CVA tenderness, guarding or rebound.     Hernia: No hernia is present.  Genitourinary:    Comments: Breast and pelvic exams deferred with shared decision making Musculoskeletal:        General: No swelling, tenderness, deformity or signs of injury.     Cervical back: Normal range of motion and neck supple. No rigidity. No muscular tenderness.     Right lower leg: No edema.     Left lower leg: No edema.  Lymphadenopathy:     Cervical: No cervical adenopathy.  Skin:    General: Skin is warm and dry.     Capillary Refill: Capillary refill takes less than 2 seconds.     Coloration: Skin is not jaundiced or pale.     Findings: No bruising, erythema, lesion or rash.  Neurological:     General: No focal deficit present.  Mental Status: She is alert and oriented to person, place, and time. Mental status is at baseline.     Cranial Nerves: No cranial nerve deficit.     Sensory: No sensory deficit.     Motor: No weakness.     Coordination: Coordination normal.     Gait: Gait normal.     Deep Tendon Reflexes: Reflexes normal.  Psychiatric:        Mood and Affect: Mood normal.        Behavior: Behavior normal.        Thought Content: Thought content normal.        Judgment: Judgment normal.     Results for orders placed or performed in visit on 10/27/19  Bayer DCA Hb A1c Waived  Result Value Ref Range   HB A1C (BAYER DCA - WAIVED) 5.4 <7.0 %  CBC with Differential/Platelet  Result Value Ref Range   WBC 6.7 3.4 - 10.8 x10E3/uL   RBC 4.45 3.77 - 5.28 x10E6/uL   Hemoglobin 12.2  11.1 - 15.9 g/dL   Hematocrit 95.6 38.7 - 46.6 %   MCV 86 79 - 97 fL   MCH 27.4 26.6 - 33.0 pg   MCHC 31.8 31.5 - 35.7 g/dL   RDW 56.4 33.2 - 95.1 %   Platelets 251 150 - 450 x10E3/uL   Neutrophils 70 Not Estab. %   Lymphs 20 Not Estab. %   Monocytes 6 Not Estab. %   Eos 3 Not Estab. %   Basos 1 Not Estab. %   Neutrophils Absolute 4.6 1.4 - 7.0 x10E3/uL   Lymphocytes Absolute 1.4 0.7 - 3.1 x10E3/uL   Monocytes Absolute 0.4 0.1 - 0.9 x10E3/uL   EOS (ABSOLUTE) 0.2 0.0 - 0.4 x10E3/uL   Basophils Absolute 0.0 0.0 - 0.2 x10E3/uL   Immature Granulocytes 0 Not Estab. %   Immature Grans (Abs) 0.0 0.0 - 0.1 x10E3/uL  Comprehensive metabolic panel  Result Value Ref Range   Glucose 90 65 - 99 mg/dL   BUN 15 8 - 27 mg/dL   Creatinine, Ser 8.84 0.57 - 1.00 mg/dL   GFR calc non Af Amer 87 >59 mL/min/1.73   GFR calc Af Amer 100 >59 mL/min/1.73   BUN/Creatinine Ratio 20 12 - 28   Sodium 140 134 - 144 mmol/L   Potassium 4.4 3.5 - 5.2 mmol/L   Chloride 102 96 - 106 mmol/L   CO2 22 20 - 29 mmol/L   Calcium 9.1 8.7 - 10.3 mg/dL   Total Protein 6.7 6.0 - 8.5 g/dL   Albumin 4.0 3.8 - 4.8 g/dL   Globulin, Total 2.7 1.5 - 4.5 g/dL   Albumin/Globulin Ratio 1.5 1.2 - 2.2   Bilirubin Total 0.3 0.0 - 1.2 mg/dL   Alkaline Phosphatase 62 39 - 117 IU/L   AST 13 0 - 40 IU/L   ALT 9 0 - 32 IU/L  Lipid Panel w/o Chol/HDL Ratio  Result Value Ref Range   Cholesterol, Total 158 100 - 199 mg/dL   Triglycerides 166 0 - 149 mg/dL   HDL 61 >06 mg/dL   VLDL Cholesterol Cal 20 5 - 40 mg/dL   LDL Chol Calc (NIH) 77 0 - 99 mg/dL  Microalbumin, Urine Waived  Result Value Ref Range   Microalb, Ur Waived 10 0 - 19 mg/L   Creatinine, Urine Waived 50 10 - 300 mg/dL   Microalb/Creat Ratio 30-300 (H) <30 mg/g  TSH  Result Value Ref Range   TSH 4.100 0.450 -  4.500 uIU/mL  UA/M w/rflx Culture, Routine   Specimen: Blood   BLD  Result Value Ref Range   Specific Gravity, UA 1.020 1.005 - 1.030   pH, UA 7.0 5.0 -  7.5   Color, UA Yellow Yellow   Appearance Ur Clear Clear   Leukocytes,UA Negative Negative   Protein,UA Negative Negative/Trace   Glucose, UA Negative Negative   Ketones, UA Negative Negative   RBC, UA Negative Negative   Bilirubin, UA Negative Negative   Urobilinogen, Ur 0.2 0.2 - 1.0 mg/dL   Nitrite, UA Negative Negative  HIV Antibody (routine testing w rflx)  Result Value Ref Range   HIV Screen 4th Generation wRfx Non Reactive Non Reactive      Assessment & Plan:   Problem List Items Addressed This Visit      Other   Anxiety    Her husband is on hospice. Reassured her that we will refill her lorazepam as needed. She should take it as she needs it. She will let us know if she needs anything. If she starts feeling more depressed, we will add wellbutrin.       Other Visit Diagnoses    Routine general medical examination at a health care facility    -  Primary   Vaccines up to date. Screening labs checked last visit and good. Pap and mammo through GYN. Continue diet and exerise. Call with any concerns.       Follow up plan: Return in about 6 months (around 05/30/2020).   LABORATORY TESTING:  - Pap smear: done elsewhere  IMMUNIZATIONS:   - Tdap: Tetanus vaccination status reviewed: last tetanus booster within 10 years. - Influenza: Up to date - Pneumovax: Not applicable  SCREENING: -Mammogram: Done elsewhere  - Colonoscopy: Up to date   PATIENT COUNSELING:   Advised to take 1 mg of folate supplement per day if capable of pregnancy.   Sexuality: Discussed sexually transmitted diseases, partner selection, use of condoms, avoidance of unintended pregnancy  and contraceptive alternatives.   Advised to avoid cigarette smoking.  I discussed with the patient that most people either abstain from alcohol or drink within safe limits (<=14/week and <=4 drinks/occasion for males, <=7/weeks and <= 3 drinks/occasion for females) and that the risk for alcohol disorders and other  health effects rises proportionally with the number of drinks per week and how often a drinker exceeds daily limits.  Discussed cessation/primary prevention of drug use and availability of treatment for abuse.   Diet: Encouraged to adjust caloric intake to maintain  or achieve ideal body weight, to reduce intake of dietary saturated fat and total fat, to limit sodium intake by avoiding high sodium foods and not adding table salt, and to maintain adequate dietary potassium and calcium preferably from fresh fruits, vegetables, and low-fat dairy products.    stressed the importance of regular exercise  Injury prevention: Discussed safety belts, safety helmets, smoke detector, smoking near bedding or upholstery.   Dental health: Discussed importance of regular tooth brushing, flossing, and dental visits.    NEXT PREVENTATIVE PHYSICAL DUE IN 1 YEAR. Return in about 6 months (around 05/30/2020).

## 2019-12-18 LAB — HM PAP SMEAR

## 2020-02-02 ENCOUNTER — Telehealth: Payer: Self-pay | Admitting: Family Medicine

## 2020-02-02 MED ORDER — SCOPOLAMINE 1 MG/3DAYS TD PT72: 1.0000 | MEDICATED_PATCH | TRANSDERMAL | 12 refills | Status: DC

## 2020-02-02 NOTE — Addendum Note (Signed)
Addended by: Dorcas Carrow on: 02/02/2020 10:17 AM   Modules accepted: Orders

## 2020-02-02 NOTE — Telephone Encounter (Signed)
Patient calling to request a prescription for "boating" sea sickness medication. Patient will be going boating this Saturday and would like to get medication before hand. Please contact patient

## 2020-03-24 ENCOUNTER — Ambulatory Visit (INDEPENDENT_AMBULATORY_CARE_PROVIDER_SITE_OTHER): Payer: PRIVATE HEALTH INSURANCE | Admitting: Dermatology

## 2020-03-24 ENCOUNTER — Other Ambulatory Visit: Payer: Self-pay

## 2020-03-24 DIAGNOSIS — L820 Inflamed seborrheic keratosis: Secondary | ICD-10-CM

## 2020-03-24 DIAGNOSIS — L578 Other skin changes due to chronic exposure to nonionizing radiation: Secondary | ICD-10-CM

## 2020-03-24 DIAGNOSIS — L821 Other seborrheic keratosis: Secondary | ICD-10-CM | POA: Diagnosis not present

## 2020-03-24 NOTE — Progress Notes (Addendum)
   Follow-Up Visit   Subjective  Mary Hardin is a 64 y.o. female who presents for the following: check spots (bil knees, ~8 months, itchy, using lotion on the areas).  The following portions of the chart were reviewed this encounter and updated as appropriate:  Tobacco  Allergies  Meds  Problems  Med Hx  Surg Hx  Fam Hx     Review of Systems:  No other skin or systemic complaints except as noted in HPI or Assessment and Plan.  Objective  Well appearing patient in no apparent distress; mood and affect are within normal limits.  A focused examination was performed including bil legs. Relevant physical exam findings are noted in the Assessment and Plan.  Objective  R lat knee x 2, R pretibial x 1, L ant thigh x 1, L pretibial x 1 (5): Erythematous keratotic or waxy stuck-on papule or plaque.    Assessment & Plan    Seborrheic Keratoses - Stuck-on, waxy, tan-brown papules and plaques  - Discussed benign etiology and prognosis. - Observe - Call for any changes  Inflamed seborrheic keratosis (5) R lat knee x 2, R pretibial x 1, L ant thigh x 1, L pretibial x 1  Destruction of lesion - R lat knee x 2, R pretibial x 1, L ant thigh x 1, L pretibial x 1 Complexity: simple   Destruction method: cryotherapy   Informed consent: discussed and consent obtained   Timeout:  patient name, date of birth, surgical site, and procedure verified Lesion destroyed using liquid nitrogen: Yes   Region frozen until ice ball extended beyond lesion: Yes   Outcome: patient tolerated procedure well with no complications   Post-procedure details: wound care instructions given    Actinic Damage - diffuse scaly erythematous macules with underlying dyspigmentation - Recommend daily broad spectrum sunscreen SPF 30+ to sun-exposed areas, reapply every 2 hours as needed.  - Call for new or changing lesions.  Return if symptoms worsen or fail to improve.   I, Ardis Rowan, RMA, am acting as  scribe for Armida Sans, MD .  Documentation: I have reviewed the above documentation for accuracy and completeness, and I agree with the above.  Armida Sans, MD

## 2020-03-24 NOTE — Patient Instructions (Addendum)
Seborrheic Keratosis  What causes seborrheic keratoses? Seborrheic keratoses are harmless, common skin growths that first appear during adult life.  As time goes by, more growths appear.  Some people may develop a large number of them.  Seborrheic keratoses appear on both covered and uncovered body parts.  They are not caused by sunlight.  The tendency to develop seborrheic keratoses can be inherited.  They vary in color from skin-colored to gray, brown, or even black.  They can be either smooth or have a rough, warty surface.   Seborrheic keratoses are superficial and look as if they were stuck on the skin.  Under the microscope this type of keratosis looks like layers upon layers of skin.  That is why at times the top layer may seem to fall off, but the rest of the growth remains and re-grows.    Treatment Seborrheic keratoses do not need to be treated, but can easily be removed in the office.  Seborrheic keratoses often cause symptoms when they rub on clothing or jewelry.  Lesions can be in the way of shaving.  If they become inflamed, they can cause itching, soreness, or burning.  Removal of a seborrheic keratosis can be accomplished by freezing, burning, or surgery. If any spot bleeds, scabs, or grows rapidly, please return to have it checked, as these can be an indication of a skin cancer.    Dr. Deland Pretty and Dr. Mickie Kay at [facility] for eyelid surgery

## 2020-03-29 ENCOUNTER — Encounter: Payer: Self-pay | Admitting: Dermatology

## 2020-05-28 ENCOUNTER — Encounter: Payer: Self-pay | Admitting: Family Medicine

## 2020-05-28 ENCOUNTER — Telehealth (INDEPENDENT_AMBULATORY_CARE_PROVIDER_SITE_OTHER): Payer: PRIVATE HEALTH INSURANCE | Admitting: Family Medicine

## 2020-05-28 ENCOUNTER — Other Ambulatory Visit: Payer: Self-pay

## 2020-05-28 ENCOUNTER — Ambulatory Visit: Payer: PRIVATE HEALTH INSURANCE | Admitting: Family Medicine

## 2020-05-28 VITALS — BP 161/83 | HR 71 | Ht 68.11 in | Wt 343.0 lb

## 2020-05-28 DIAGNOSIS — F419 Anxiety disorder, unspecified: Secondary | ICD-10-CM | POA: Diagnosis not present

## 2020-05-28 DIAGNOSIS — I10 Essential (primary) hypertension: Secondary | ICD-10-CM

## 2020-05-28 DIAGNOSIS — F339 Major depressive disorder, recurrent, unspecified: Secondary | ICD-10-CM

## 2020-05-28 DIAGNOSIS — R0789 Other chest pain: Secondary | ICD-10-CM

## 2020-05-28 DIAGNOSIS — I700 Atherosclerosis of aorta: Secondary | ICD-10-CM | POA: Diagnosis not present

## 2020-05-28 DIAGNOSIS — Z6841 Body Mass Index (BMI) 40.0 and over, adult: Secondary | ICD-10-CM

## 2020-05-28 MED ORDER — NAPROXEN 500 MG PO TABS: 500.0000 mg | ORAL_TABLET | Freq: Two times a day (BID) | ORAL | 1 refills | Status: DC

## 2020-05-28 MED ORDER — ESCITALOPRAM OXALATE 20 MG PO TABS: 20.0000 mg | ORAL_TABLET | Freq: Every day | ORAL | 1 refills | Status: DC

## 2020-05-28 MED ORDER — LOSARTAN POTASSIUM 25 MG PO TABS: 25.0000 mg | ORAL_TABLET | Freq: Every day | ORAL | 3 refills | Status: DC

## 2020-05-28 MED ORDER — ESOMEPRAZOLE MAGNESIUM 40 MG PO CPDR: DELAYED_RELEASE_CAPSULE | ORAL | 1 refills | Status: DC

## 2020-05-28 MED ORDER — LORAZEPAM 1 MG PO TABS: 1.0000 mg | ORAL_TABLET | Freq: Every day | ORAL | 0 refills | Status: DC | PRN

## 2020-05-28 NOTE — Assessment & Plan Note (Signed)
Will check cholesterol and get BP under good control. Continue to monitor.

## 2020-05-28 NOTE — Progress Notes (Signed)
BP (!) 161/83 Comment: Wrist Cuff - Left Wrist  Pulse 71   Ht 5' 8.11" (1.73 m)   Wt (!) 343 lb (155.6 kg)   BMI 51.98 kg/m    Subjective:    Patient ID: Mary Hardin, female    DOB: 05/16/1956, 64 y.o.   MRN: [id number]  HPI: Mary Hardin is a 64 y.o. female  Chief Complaint  Patient presents with  . Hypertension    6 month follow up. Wants to come in within 2 weeks for labs and the flu shot.   HYPERTENSION Hypertension status: uncontrolled  Satisfied with current treatment? Not on anything Duration of hypertension: unknown BP monitoring frequency:  rarely Previous BP meds: none Aspirin: no Recurrent headaches: no Visual changes: no Palpitations: yes Dyspnea: no Chest pain: yes Lower extremity edema: no Dizzy/lightheaded: no  ANXIETY/DEPRESSION Duration: chronic Status:controlled Anxious mood: no  Excessive worrying: no Irritability: no  Sweating: no Nausea: no Palpitations:no Hyperventilation: no Panic attacks: no Agoraphobia: no  Obscessions/compulsions: no Depressed mood: no Depression screen [facility] 2/9 05/28/2020 10/27/2019 06/11/2018 11/27/2017 07/30/2017  Decreased Interest 0 0 0 0 0  Down, Depressed, Hopeless 0 0 0 0 0  PHQ - 2 Score 0 0 0 0 0  Altered sleeping 0 0 0 - -  Tired, decreased energy 0 0 0 - -  Change in appetite 0 0 0 - -  Feeling bad or failure about yourself  0 0 0 - -  Trouble concentrating 0 0 0 - -  Moving slowly or fidgety/restless 0 0 0 - -  Suicidal thoughts 0 0 0 - -  PHQ-9 Score 0 0 0 - -  Difficult doing work/chores Not difficult at all Not difficult at all - - -   GAD 7 : Generalized Anxiety Score 05/28/2020 10/27/2019 05/16/2016  Nervous, Anxious, on Edge 0 0 1  Control/stop worrying 0 0 0  Worry too much - different things 0 0 0  Trouble relaxing 0 0 0  Restless 0 0 1  Easily annoyed or irritable 0 0 0  Afraid - awful might happen 0 0 1  Total GAD 7 Score 0 0 3  Anxiety Difficulty Not difficult at all Not  difficult at all Not difficult at all   Anhedonia: no Weight changes: no Insomnia: no   Hypersomnia: no Fatigue/loss of energy: no Feelings of worthlessness: no Feelings of guilt: no Impaired concentration/indecisiveness: no Suicidal ideations: no  Crying spells: no Recent Stressors/Life Changes: no   Relationship problems: no   Family stress: no     Financial stress: no    Job stress: no    Recent death/loss: no  Relevant past medical, surgical, family and social history reviewed and updated as indicated. Interim medical history since our last visit reviewed. Allergies and medications reviewed and updated.  Review of Systems  Constitutional: Negative.   Respiratory: Negative.   Cardiovascular: Negative.   Neurological: Negative.   Psychiatric/Behavioral: Negative.     Per HPI unless specifically indicated above     Objective:    BP (!) 161/83 Comment: Wrist Cuff - Left Wrist  Pulse 71   Ht 5' 8.11" (1.73 m)   Wt (!) 343 lb (155.6 kg)   BMI 51.98 kg/m   Wt Readings from Last 3 Encounters:  05/28/20 (!) 343 lb (155.6 kg)  11/28/19 (!) 343 lb 9.6 oz (155.9 kg)  10/27/19 (!) 353 lb (160.1 kg)    Physical Exam Vitals and nursing note reviewed.  Constitutional:      General: She is not in acute distress.    Appearance: Normal appearance. She is not ill-appearing, toxic-appearing or diaphoretic.  HENT:     Head: Normocephalic and atraumatic.     Right Ear: External ear normal.     Left Ear: External ear normal.     Nose: Nose normal.     Mouth/Throat:     Mouth: Mucous membranes are moist.     Pharynx: Oropharynx is clear.  Eyes:     General: No scleral icterus.       Right eye: No discharge.        Left eye: No discharge.     Conjunctiva/sclera: Conjunctivae normal.     Pupils: Pupils are equal, round, and reactive to light.  Pulmonary:     Effort: Pulmonary effort is normal. No respiratory distress.     Comments: Speaking in full  sentences Musculoskeletal:        General: Normal range of motion.     Cervical back: Normal range of motion.  Skin:    Coloration: Skin is not jaundiced or pale.     Findings: No bruising, erythema, lesion or rash.  Neurological:     Mental Status: She is alert and oriented to person, place, and time. Mental status is at baseline.  Psychiatric:        Mood and Affect: Mood normal.        Behavior: Behavior normal.        Thought Content: Thought content normal.        Judgment: Judgment normal.     Results for orders placed or performed in visit on 10/27/19  Bayer DCA Hb A1c Waived  Result Value Ref Range   HB A1C (BAYER DCA - WAIVED) 5.4 <7.0 %  CBC with Differential/Platelet  Result Value Ref Range   WBC 6.7 3.4 - 10.8 x10E3/uL   RBC 4.45 3.77 - 5.28 x10E6/uL   Hemoglobin 12.2 11.1 - 15.9 g/dL   Hematocrit 40.938.4 81.134.0 - 46.6 %   MCV 86 79 - 97 fL   MCH 27.4 26.6 - 33.0 pg   MCHC 31.8 31 - 35 g/dL   RDW 91.414.1 78.211.7 - 95.615.4 %   Platelets 251 150 - 450 x10E3/uL   Neutrophils 70 Not Estab. %   Lymphs 20 Not Estab. %   Monocytes 6 Not Estab. %   Eos 3 Not Estab. %   Basos 1 Not Estab. %   Neutrophils Absolute 4.6 1.40 - 7.00 x10E3/uL   Lymphocytes Absolute 1.4 0 - 3 x10E3/uL   Monocytes Absolute 0.4 0 - 0 x10E3/uL   EOS (ABSOLUTE) 0.2 0.0 - 0.4 x10E3/uL   Basophils Absolute 0.0 0 - 0 x10E3/uL   Immature Granulocytes 0 Not Estab. %   Immature Grans (Abs) 0.0 0.0 - 0.1 x10E3/uL  Comprehensive metabolic panel  Result Value Ref Range   Glucose 90 65 - 99 mg/dL   BUN 15 8 - 27 mg/dL   Creatinine, Ser 2.130.74 0.57 - 1.00 mg/dL   GFR calc non Af Amer 87 >59 mL/min/1.73   GFR calc Af Amer 100 >59 mL/min/1.73   BUN/Creatinine Ratio 20 12 - 28   Sodium 140 134 - 144 mmol/L   Potassium 4.4 3.5 - 5.2 mmol/L   Chloride 102 96 - 106 mmol/L   CO2 22 20 - 29 mmol/L   Calcium 9.1 8.7 - 10.3 mg/dL   Total Protein 6.7 6.0 - 8.5 g/dL  Albumin 4.0 3.8 - 4.8 g/dL   Globulin, Total 2.7 1.5  - 4.5 g/dL   Albumin/Globulin Ratio 1.5 1.2 - 2.2   Bilirubin Total 0.3 0.0 - 1.2 mg/dL   Alkaline Phosphatase 62 39 - 117 IU/L   AST 13 0 - 40 IU/L   ALT 9 0 - 32 IU/L  Lipid Panel w/o Chol/HDL Ratio  Result Value Ref Range   Cholesterol, Total 158 100 - 199 mg/dL   Triglycerides 532 0 - 149 mg/dL   HDL 61 >99 mg/dL   VLDL Cholesterol Cal 20 5 - 40 mg/dL   LDL Chol Calc (NIH) 77 0 - 99 mg/dL  Microalbumin, Urine Waived  Result Value Ref Range   Microalb, Ur Waived 10 0 - 19 mg/L   Creatinine, Urine Waived 50 10 - 300 mg/dL   Microalb/Creat Ratio 30-300 (H) <30 mg/g  TSH  Result Value Ref Range   TSH 4.100 0.450 - 4.500 uIU/mL  UA/M w/rflx Culture, Routine   Specimen: Blood   BLD  Result Value Ref Range   Specific Gravity, UA 1.020 1.005 - 1.030   pH, UA 7.0 5.0 - 7.5   Color, UA Yellow Yellow   Appearance Ur Clear Clear   Leukocytes,UA Negative Negative   Protein,UA Negative Negative/Trace   Glucose, UA Negative Negative   Ketones, UA Negative Negative   RBC, UA Negative Negative   Bilirubin, UA Negative Negative   Urobilinogen, Ur 0.2 0.2 - 1.0 mg/dL   Nitrite, UA Negative Negative  HIV Antibody (routine testing w rflx)  Result Value Ref Range   HIV Screen 4th Generation wRfx Non Reactive Non Reactive      Assessment & Plan:   Problem List Items Addressed This Visit      Cardiovascular and Mediastinum   HTN (hypertension)    Not under good control. Will start losartan and recheck 2-4 weeks. Call with any concerns.       Relevant Medications   losartan (COZAAR) 25 MG tablet   Other Relevant Orders   CBC with Differential/Platelet   Comprehensive metabolic panel   Microalbumin, Urine Waived   Atherosclerosis of aorta (HCC) - Primary    Will check cholesterol and get BP under good control. Continue to monitor.      Relevant Medications   losartan (COZAAR) 25 MG tablet   Other Relevant Orders   CBC with Differential/Platelet   Comprehensive metabolic  panel   Lipid Panel w/o Chol/HDL Ratio     Other   Depression, recurrent (HCC)    Under good control on current regimen. Continue current regimen. Continue to monitor. Call with any concerns. Refills given.        Relevant Medications   LORazepam (ATIVAN) 1 MG tablet   escitalopram (LEXAPRO) 20 MG tablet   Other Relevant Orders   CBC with Differential/Platelet   Comprehensive metabolic panel   BMI 50.0-59.9, adult (HCC)    Encouraged diet and exercise with goal of losing 1-2lbs per week. Call with any concerns.       Anxiety    Under good control on current regimen. Continue current regimen. Continue to monitor. Call with any concerns. Refills given.        Relevant Medications   LORazepam (ATIVAN) 1 MG tablet   escitalopram (LEXAPRO) 20 MG tablet    Other Visit Diagnoses    Other chest pain       Relevant Medications   escitalopram (LEXAPRO) 20 MG tablet  Follow up plan: Return 2-4 weeks follow up BP.   Marland Kitchen This visit was completed via MyChart due to the restrictions of the COVID-19 pandemic. All issues as above were discussed and addressed. Physical exam was done as above through visual confirmation on MyChart. If it was felt that the patient should be evaluated in the office, they were directed there. The patient verbally consented to this visit. . Location of the patient: home . Location of the provider: work . Those involved with this call:  . Provider: Olevia Perches, DO . CMA: Adele Schilder, CMA . Front Desk/Registration: Harriet Pho  . Time spent on call: 25 minutes with patient face to face via video conference. More than 50% of this time was spent in counseling and coordination of care. 40 minutes total spent in review of patient's record and preparation of their chart.

## 2020-05-28 NOTE — Assessment & Plan Note (Signed)
Under good control on current regimen. Continue current regimen. Continue to monitor. Call with any concerns. Refills given.   

## 2020-05-28 NOTE — Assessment & Plan Note (Signed)
Encouraged diet and exercise with goal of losing 1-2lbs per week. Call with any concerns.  

## 2020-05-28 NOTE — Assessment & Plan Note (Signed)
Not under good control. Will start losartan and recheck 2-4 weeks. Call with any concerns.

## 2020-06-24 ENCOUNTER — Encounter: Payer: Self-pay | Admitting: Family Medicine

## 2020-06-24 ENCOUNTER — Other Ambulatory Visit: Payer: Self-pay

## 2020-06-24 ENCOUNTER — Ambulatory Visit (INDEPENDENT_AMBULATORY_CARE_PROVIDER_SITE_OTHER): Payer: PRIVATE HEALTH INSURANCE | Admitting: Family Medicine

## 2020-06-24 VITALS — BP 137/81 | HR 66 | Temp 98.7°F | Ht 68.11 in | Wt 346.2 lb

## 2020-06-24 DIAGNOSIS — R0789 Other chest pain: Secondary | ICD-10-CM | POA: Diagnosis not present

## 2020-06-24 DIAGNOSIS — Z23 Encounter for immunization: Secondary | ICD-10-CM | POA: Diagnosis not present

## 2020-06-24 DIAGNOSIS — I10 Essential (primary) hypertension: Secondary | ICD-10-CM

## 2020-06-24 MED ORDER — ESCITALOPRAM OXALATE 20 MG PO TABS: 20.0000 mg | ORAL_TABLET | Freq: Every day | ORAL | 1 refills | Status: DC

## 2020-06-24 MED ORDER — ESOMEPRAZOLE MAGNESIUM 40 MG PO CPDR: DELAYED_RELEASE_CAPSULE | ORAL | 1 refills | Status: DC

## 2020-06-24 MED ORDER — NAPROXEN 500 MG PO TABS: 500.0000 mg | ORAL_TABLET | Freq: Two times a day (BID) | ORAL | 1 refills | Status: DC

## 2020-06-24 MED ORDER — LOSARTAN POTASSIUM 25 MG PO TABS: 25.0000 mg | ORAL_TABLET | Freq: Every day | ORAL | 1 refills | Status: DC

## 2020-06-24 NOTE — Assessment & Plan Note (Signed)
Under good control on current regimen. Continue current regimen. Continue to monitor. Call with any concerns. Refills given. Labs drawn today.   

## 2020-06-24 NOTE — Progress Notes (Signed)
BP 137/81   Pulse 66   Temp 98.7 F (37.1 C) (Oral)   Ht 5' 8.11" (1.73 m)   Wt (!) 346 lb 3.2 oz (157 kg)   SpO2 100%   BMI 52.47 kg/m    Subjective:    Patient ID: Mary Hardin, female    DOB: 12-16-1955, 64 y.o.   MRN: [id number]  HPI: Mary Hardin is a 64 y.o. female  Chief Complaint  Patient presents with  . Hypertension    patient states doing much better   HYPERTENSION Hypertension status: better  Satisfied with current treatment? yes Duration of hypertension: chronic BP monitoring frequency:  not checking BP medication side effects:  no Medication compliance: excellent compliance Previous BP meds:losartan Aspirin: no Recurrent headaches: no Visual changes: no Palpitations: no Dyspnea: no Chest pain: no Lower extremity edema: no Dizzy/lightheaded: no  Relevant past medical, surgical, family and social history reviewed and updated as indicated. Interim medical history since our last visit reviewed. Allergies and medications reviewed and updated.  Review of Systems  Constitutional: Negative.   Respiratory: Negative.   Cardiovascular: Negative.   Gastrointestinal: Negative.   Musculoskeletal: Negative.   Neurological: Negative.   Psychiatric/Behavioral: Negative.     Per HPI unless specifically indicated above     Objective:    BP 137/81   Pulse 66   Temp 98.7 F (37.1 C) (Oral)   Ht 5' 8.11" (1.73 m)   Wt (!) 346 lb 3.2 oz (157 kg)   SpO2 100%   BMI 52.47 kg/m   Wt Readings from Last 3 Encounters:  06/24/20 (!) 346 lb 3.2 oz (157 kg)  05/28/20 (!) 343 lb (155.6 kg)  11/28/19 (!) 343 lb 9.6 oz (155.9 kg)    Physical Exam Vitals and nursing note reviewed.  Constitutional:      General: She is not in acute distress.    Appearance: Normal appearance. She is obese. She is not ill-appearing, toxic-appearing or diaphoretic.  HENT:     Head: Normocephalic and atraumatic.     Right Ear: External ear normal.     Left Ear: External  ear normal.     Nose: Nose normal.     Mouth/Throat:     Mouth: Mucous membranes are moist.     Pharynx: Oropharynx is clear.  Eyes:     General: No scleral icterus.       Right eye: No discharge.        Left eye: No discharge.     Extraocular Movements: Extraocular movements intact.     Conjunctiva/sclera: Conjunctivae normal.     Pupils: Pupils are equal, round, and reactive to light.  Cardiovascular:     Rate and Rhythm: Normal rate and regular rhythm.     Pulses: Normal pulses.     Heart sounds: Normal heart sounds. No murmur heard.  No friction rub. No gallop.   Pulmonary:     Effort: Pulmonary effort is normal. No respiratory distress.     Breath sounds: Normal breath sounds. No stridor. No wheezing, rhonchi or rales.  Chest:     Chest wall: No tenderness.  Musculoskeletal:        General: Normal range of motion.     Cervical back: Normal range of motion and neck supple.  Skin:    General: Skin is warm and dry.     Capillary Refill: Capillary refill takes less than 2 seconds.     Coloration: Skin is not jaundiced or pale.  Findings: No bruising, erythema, lesion or rash.  Neurological:     General: No focal deficit present.     Mental Status: She is alert and oriented to person, place, and time. Mental status is at baseline.  Psychiatric:        Mood and Affect: Mood normal.        Behavior: Behavior normal.        Thought Content: Thought content normal.        Judgment: Judgment normal.     Results for orders placed or performed in visit on 10/27/19  Bayer DCA Hb A1c Waived  Result Value Ref Range   HB A1C (BAYER DCA - WAIVED) 5.4 <7.0 %  CBC with Differential/Platelet  Result Value Ref Range   WBC 6.7 3.4 - 10.8 x10E3/uL   RBC 4.45 3.77 - 5.28 x10E6/uL   Hemoglobin 12.2 11.1 - 15.9 g/dL   Hematocrit 40.3 47.4 - 46.6 %   MCV 86 79 - 97 fL   MCH 27.4 26.6 - 33.0 pg   MCHC 31.8 31 - 35 g/dL   RDW 25.9 56.3 - 87.5 %   Platelets 251 150 - 450 x10E3/uL    Neutrophils 70 Not Estab. %   Lymphs 20 Not Estab. %   Monocytes 6 Not Estab. %   Eos 3 Not Estab. %   Basos 1 Not Estab. %   Neutrophils Absolute 4.6 1.40 - 7.00 x10E3/uL   Lymphocytes Absolute 1.4 0 - 3 x10E3/uL   Monocytes Absolute 0.4 0 - 0 x10E3/uL   EOS (ABSOLUTE) 0.2 0.0 - 0.4 x10E3/uL   Basophils Absolute 0.0 0 - 0 x10E3/uL   Immature Granulocytes 0 Not Estab. %   Immature Grans (Abs) 0.0 0.0 - 0.1 x10E3/uL  Comprehensive metabolic panel  Result Value Ref Range   Glucose 90 65 - 99 mg/dL   BUN 15 8 - 27 mg/dL   Creatinine, Ser 6.43 0.57 - 1.00 mg/dL   GFR calc non Af Amer 87 >59 mL/min/1.73   GFR calc Af Amer 100 >59 mL/min/1.73   BUN/Creatinine Ratio 20 12 - 28   Sodium 140 134 - 144 mmol/L   Potassium 4.4 3.5 - 5.2 mmol/L   Chloride 102 96 - 106 mmol/L   CO2 22 20 - 29 mmol/L   Calcium 9.1 8.7 - 10.3 mg/dL   Total Protein 6.7 6.0 - 8.5 g/dL   Albumin 4.0 3.8 - 4.8 g/dL   Globulin, Total 2.7 1.5 - 4.5 g/dL   Albumin/Globulin Ratio 1.5 1.2 - 2.2   Bilirubin Total 0.3 0.0 - 1.2 mg/dL   Alkaline Phosphatase 62 39 - 117 IU/L   AST 13 0 - 40 IU/L   ALT 9 0 - 32 IU/L  Lipid Panel w/o Chol/HDL Ratio  Result Value Ref Range   Cholesterol, Total 158 100 - 199 mg/dL   Triglycerides 329 0 - 149 mg/dL   HDL 61 >51 mg/dL   VLDL Cholesterol Cal 20 5 - 40 mg/dL   LDL Chol Calc (NIH) 77 0 - 99 mg/dL  Microalbumin, Urine Waived  Result Value Ref Range   Microalb, Ur Waived 10 0 - 19 mg/L   Creatinine, Urine Waived 50 10 - 300 mg/dL   Microalb/Creat Ratio 30-300 (H) <30 mg/g  TSH  Result Value Ref Range   TSH 4.100 0.450 - 4.500 uIU/mL  UA/M w/rflx Culture, Routine   Specimen: Blood   BLD  Result Value Ref Range   Specific Gravity, UA 1.020  1.005 - 1.030   pH, UA 7.0 5.0 - 7.5   Color, UA Yellow Yellow   Appearance Ur Clear Clear   Leukocytes,UA Negative Negative   Protein,UA Negative Negative/Trace   Glucose, UA Negative Negative   Ketones, UA Negative Negative    RBC, UA Negative Negative   Bilirubin, UA Negative Negative   Urobilinogen, Ur 0.2 0.2 - 1.0 mg/dL   Nitrite, UA Negative Negative  HIV Antibody (routine testing w rflx)  Result Value Ref Range   HIV Screen 4th Generation wRfx Non Reactive Non Reactive      Assessment & Plan:   Problem List Items Addressed This Visit      Cardiovascular and Mediastinum   HTN (hypertension) - Primary    Under good control on current regimen. Continue current regimen. Continue to monitor. Call with any concerns. Refills given. Labs drawn today.       Relevant Medications   losartan (COZAAR) 25 MG tablet   Other Relevant Orders   Basic metabolic panel    Other Visit Diagnoses    Need for influenza vaccination       Flu shot given today.   Relevant Orders   Flu Vaccine QUAD 6+ mos PF IM (Fluarix Quad PF) (Completed)   Other chest pain       Relevant Medications   escitalopram (LEXAPRO) 20 MG tablet       Follow up plan: Return in about 5 months (around 11/22/2020) for physical.

## 2020-06-25 LAB — BASIC METABOLIC PANEL
BUN/Creatinine Ratio: 20 (ref 12–28)
BUN: 14 mg/dL (ref 8–27)
CO2: 21 mmol/L (ref 20–29)
Calcium: 9 mg/dL (ref 8.7–10.3)
Chloride: 105 mmol/L (ref 96–106)
Creatinine, Ser: 0.71 mg/dL (ref 0.57–1.00)
GFR calc Af Amer: 105 mL/min/{1.73_m2} (ref >59)
GFR calc non Af Amer: 91 mL/min/{1.73_m2} (ref >59)
Glucose: 98 mg/dL (ref 65–99)
Potassium: 4.4 mmol/L (ref 3.5–5.2)
Sodium: 140 mmol/L (ref 134–144)

## 2020-09-29 ENCOUNTER — Other Ambulatory Visit: Payer: Self-pay

## 2020-09-30 MED ORDER — LOSARTAN POTASSIUM 25 MG PO TABS: 25.0000 mg | ORAL_TABLET | Freq: Every day | ORAL | 1 refills | Status: DC

## 2020-10-15 ENCOUNTER — Encounter: Payer: Self-pay | Admitting: Family Medicine

## 2020-10-15 ENCOUNTER — Other Ambulatory Visit: Payer: Self-pay

## 2020-10-15 ENCOUNTER — Telehealth (INDEPENDENT_AMBULATORY_CARE_PROVIDER_SITE_OTHER): Payer: PRIVATE HEALTH INSURANCE | Admitting: Family Medicine

## 2020-10-15 DIAGNOSIS — L237 Allergic contact dermatitis due to plants, except food: Secondary | ICD-10-CM

## 2020-10-15 MED ORDER — PREDNISONE 10 MG PO TABS: ORAL_TABLET | ORAL | 0 refills | Status: DC

## 2020-10-15 NOTE — Progress Notes (Signed)
There were no vitals taken for this visit.   Subjective:    Patient ID: Mary Hardin, female    DOB: 03/09/1956, 65 y.o.   MRN: [id number]  HPI: Mary Hardin is a 65 y.o. female  Chief Complaint  Patient presents with  . Poison Ivy    Patient states about a week ago she noticed it on her left lower leg, it has now spread up to her thigh.    RASH Duration:  Few days  Location: leg  Itching: yes Burning: yes Redness: yes Oozing: no Scaling: yes Blisters: yes Painful: yes Fevers: no Change in detergents/soaps/personal care products: no Recent illness: no Recent travel:no History of same: yes Context: worse Alleviating factors: nothing Treatments attempted:calamine Shortness of breath: no  Throat/tongue swelling: no Myalgias/arthralgias: no  Relevant past medical, surgical, family and social history reviewed and updated as indicated. Interim medical history since our last visit reviewed. Allergies and medications reviewed and updated.  Review of Systems  Constitutional: Negative.   Respiratory: Negative.   Cardiovascular: Negative.   Gastrointestinal: Negative.   Skin: Positive for rash. Negative for color change, pallor and wound.  Neurological: Negative.   Psychiatric/Behavioral: Negative.     Per HPI unless specifically indicated above     Objective:    There were no vitals taken for this visit.  Wt Readings from Last 3 Encounters:  06/24/20 (!) 346 lb 3.2 oz (157 kg)  05/28/20 (!) 343 lb (155.6 kg)  11/28/19 (!) 343 lb 9.6 oz (155.9 kg)    Physical Exam Vitals and nursing note reviewed.  Constitutional:      General: She is not in acute distress.    Appearance: Normal appearance. She is not ill-appearing, toxic-appearing or diaphoretic.  HENT:     Head: Normocephalic and atraumatic.     Right Ear: External ear normal.     Left Ear: External ear normal.     Nose: Nose normal.     Mouth/Throat:     Mouth: Mucous membranes are moist.      Pharynx: Oropharynx is clear.  Eyes:     General: No scleral icterus.       Right eye: No discharge.        Left eye: No discharge.     Conjunctiva/sclera: Conjunctivae normal.     Pupils: Pupils are equal, round, and reactive to light.  Pulmonary:     Effort: Pulmonary effort is normal. No respiratory distress.     Comments: Speaking in full sentences Musculoskeletal:        General: Normal range of motion.     Cervical back: Normal range of motion.  Skin:    Coloration: Skin is not jaundiced or pale.     Findings: Rash (erythematous excoriated rash on R  leg) present. No bruising, erythema or lesion.  Neurological:     Mental Status: She is alert and oriented to person, place, and time. Mental status is at baseline.  Psychiatric:        Mood and Affect: Mood normal.        Behavior: Behavior normal.        Thought Content: Thought content normal.        Judgment: Judgment normal.     Results for orders placed or performed in visit on 06/24/20  Basic metabolic panel  Result Value Ref Range   Glucose 98 65 - 99 mg/dL   BUN 14 8 - 27 mg/dL   Creatinine, Ser 3.38 0.57 -  1.00 mg/dL   GFR calc non Af Amer 91 >59 mL/min/1.73   GFR calc Af Amer 105 >59 mL/min/1.73   BUN/Creatinine Ratio 20 12 - 28   Sodium 140 134 - 144 mmol/L   Potassium 4.4 3.5 - 5.2 mmol/L   Chloride 105 96 - 106 mmol/L   CO2 21 20 - 29 mmol/L   Calcium 9.0 8.7 - 10.3 mg/dL      Assessment & Plan:   Problem List Items Addressed This Visit   None   Visit Diagnoses    Allergic contact dermatitis due to plants, except food    -  Primary   Will treat with prednisone taper call if not getting better or getting worse.        Follow up plan: Return if symptoms worsen or fail to improve.   . This visit was completed via MyChart due to the restrictions of the COVID-19 pandemic. All issues as above were discussed and addressed. Physical exam was done as above through visual confirmation on MyChart. If it  was felt that the patient should be evaluated in the office, they were directed there. The patient verbally consented to this visit. . Location of the patient: home . Location of the provider: work . Those involved with this call:  . Provider: Olevia Perches, DO . CMA: Rolley Sims, CMA . Front Desk/Registration: Harriet Pho  . Time spent on call: 15 minutes with patient face to face via video conference. More than 50% of this time was spent in counseling and coordination of care. 23 minutes total spent in review of patient's record and preparation of their chart.

## 2020-11-21 ENCOUNTER — Other Ambulatory Visit: Payer: Self-pay | Admitting: Family Medicine

## 2020-11-21 DIAGNOSIS — R0789 Other chest pain: Secondary | ICD-10-CM

## 2020-11-24 ENCOUNTER — Encounter: Payer: PRIVATE HEALTH INSURANCE | Admitting: Family Medicine

## 2020-12-03 ENCOUNTER — Other Ambulatory Visit: Payer: Self-pay

## 2020-12-03 ENCOUNTER — Ambulatory Visit (INDEPENDENT_AMBULATORY_CARE_PROVIDER_SITE_OTHER): Payer: PRIVATE HEALTH INSURANCE | Admitting: Family Medicine

## 2020-12-03 ENCOUNTER — Encounter: Payer: Self-pay | Admitting: Family Medicine

## 2020-12-03 VITALS — BP 135/80 | HR 67 | Temp 98.2°F | Ht 67.5 in | Wt 361.2 lb

## 2020-12-03 DIAGNOSIS — K219 Gastro-esophageal reflux disease without esophagitis: Secondary | ICD-10-CM | POA: Diagnosis not present

## 2020-12-03 DIAGNOSIS — F339 Major depressive disorder, recurrent, unspecified: Secondary | ICD-10-CM | POA: Diagnosis not present

## 2020-12-03 DIAGNOSIS — Z124 Encounter for screening for malignant neoplasm of cervix: Secondary | ICD-10-CM

## 2020-12-03 DIAGNOSIS — I10 Essential (primary) hypertension: Secondary | ICD-10-CM | POA: Diagnosis not present

## 2020-12-03 DIAGNOSIS — I700 Atherosclerosis of aorta: Secondary | ICD-10-CM

## 2020-12-03 DIAGNOSIS — Z23 Encounter for immunization: Secondary | ICD-10-CM

## 2020-12-03 DIAGNOSIS — Z0000 Encounter for general adult medical examination without abnormal findings: Secondary | ICD-10-CM | POA: Diagnosis not present

## 2020-12-03 DIAGNOSIS — Z1231 Encounter for screening mammogram for malignant neoplasm of breast: Secondary | ICD-10-CM

## 2020-12-03 LAB — URINALYSIS, ROUTINE W REFLEX MICROSCOPIC
Bilirubin, UA: NEGATIVE
Glucose, UA: NEGATIVE
Ketones, UA: NEGATIVE
Leukocytes,UA: NEGATIVE
Nitrite, UA: NEGATIVE
Protein,UA: NEGATIVE
RBC, UA: NEGATIVE
Specific Gravity, UA: <1.005 — ABNORMAL LOW (ref 1.005–1.030)
Urobilinogen, Ur: 0.2 mg/dL (ref 0.2–1.0)
pH, UA: 6 (ref 5.0–7.5)

## 2020-12-03 LAB — MICROALBUMIN, URINE WAIVED
Creatinine, Urine Waived: 10 mg/dL (ref 10–300)
Microalb, Ur Waived: 10 mg/L (ref 0–19)
Microalb/Creat Ratio: <30 mg/g (ref <30)

## 2020-12-03 MED ORDER — ESOMEPRAZOLE MAGNESIUM 40 MG PO CPDR: DELAYED_RELEASE_CAPSULE | ORAL | 1 refills | Status: DC

## 2020-12-03 MED ORDER — LOSARTAN POTASSIUM 25 MG PO TABS: 25.0000 mg | ORAL_TABLET | Freq: Every day | ORAL | 1 refills | Status: DC

## 2020-12-03 MED ORDER — NAPROXEN 500 MG PO TABS: 500.0000 mg | ORAL_TABLET | Freq: Two times a day (BID) | ORAL | 1 refills | Status: DC

## 2020-12-03 MED ORDER — ESCITALOPRAM OXALATE 20 MG PO TABS: 20.0000 mg | ORAL_TABLET | Freq: Every day | ORAL | 1 refills | Status: DC

## 2020-12-03 NOTE — Patient Instructions (Addendum)
Health Maintenance, Female Adopting a healthy lifestyle and getting preventive care are important in promoting health and wellness. Ask your health care provider about:  The right schedule for you to have regular tests and exams.  Things you can do on your own to prevent diseases and keep yourself healthy. What should I know about diet, weight, and exercise? Eat a healthy diet  Eat a diet that includes plenty of vegetables, fruits, low-fat dairy products, and lean protein.  Do not eat a lot of foods that are high in solid fats, added sugars, or sodium.   Maintain a healthy weight Body mass index (BMI) is used to identify weight problems. It estimates body fat based on height and weight. Your health care provider can help determine your BMI and help you achieve or maintain a healthy weight. Get regular exercise Get regular exercise. This is one of the most important things you can do for your health. Most adults should:  Exercise for at least 150 minutes each week. The exercise should increase your heart rate and make you sweat (moderate-intensity exercise).  Do strengthening exercises at least twice a week. This is in addition to the moderate-intensity exercise.  Spend less time sitting. Even light physical activity can be beneficial. Watch cholesterol and blood lipids Have your blood tested for lipids and cholesterol at 64 years of age, then have this test every 5 years. Have your cholesterol levels checked more often if:  Your lipid or cholesterol levels are high.  You are older than 64 years of age.  You are at high risk for heart disease. What should I know about cancer screening? Depending on your health history and family history, you may need to have cancer screening at various ages. This may include screening for:  Breast cancer.  Cervical cancer.  Colorectal cancer.  Skin cancer.  Lung cancer. What should I know about heart disease, diabetes, and high blood  pressure? Blood pressure and heart disease  High blood pressure causes heart disease and increases the risk of stroke. This is more likely to develop in people who have high blood pressure readings, are of African descent, or are overweight.  Have your blood pressure checked: ? Every 3-5 years if you are 18-39 years of age. ? Every year if you are 40 years old or older. Diabetes Have regular diabetes screenings. This checks your fasting blood sugar level. Have the screening done:  Once every three years after age 40 if you are at a normal weight and have a low risk for diabetes.  More often and at a younger age if you are overweight or have a high risk for diabetes. What should I know about preventing infection? Hepatitis B If you have a higher risk for hepatitis B, you should be screened for this virus. Talk with your health care provider to find out if you are at risk for hepatitis B infection. Hepatitis C Testing is recommended for:  Everyone born from 1945 through 1965.  Anyone with known risk factors for hepatitis C. Sexually transmitted infections (STIs)  Get screened for STIs, including gonorrhea and chlamydia, if: ? You are sexually active and are younger than 64 years of age. ? You are older than 64 years of age and your health care provider tells you that you are at risk for this type of infection. ? Your sexual activity has changed since you were last screened, and you are at increased risk for chlamydia or gonorrhea. Ask your health care provider   if you are at risk.  Ask your health care provider about whether you are at high risk for HIV. Your health care provider may recommend a prescription medicine to help prevent HIV infection. If you choose to take medicine to prevent HIV, you should first get tested for HIV. You should then be tested every 3 months for as long as you are taking the medicine. Pregnancy  If you are about to stop having your period (premenopausal) and  you may become pregnant, seek counseling before you get pregnant.  Take 400 to 800 micrograms (mcg) of folic acid every day if you become pregnant.  Ask for birth control (contraception) if you want to prevent pregnancy. Osteoporosis and menopause Osteoporosis is a disease in which the bones lose minerals and strength with aging. This can result in bone fractures. If you are 50 years old or older, or if you are at risk for osteoporosis and fractures, ask your health care provider if you should:  Be screened for bone loss.  Take a calcium or vitamin D supplement to lower your risk of fractures.  Be given hormone replacement therapy (HRT) to treat symptoms of menopause. Follow these instructions at home: Lifestyle  Do not use any products that contain nicotine or tobacco, such as cigarettes, e-cigarettes, and chewing tobacco. If you need help quitting, ask your health care provider.  Do not use street drugs.  Do not share needles.  Ask your health care provider for help if you need support or information about quitting drugs. Alcohol use  Do not drink alcohol if: ? Your health care provider tells you not to drink. ? You are pregnant, may be pregnant, or are planning to become pregnant.  If you drink alcohol: ? Limit how much you use to 0-1 drink a day. ? Limit intake if you are breastfeeding.  Be aware of how much alcohol is in your drink. In the [place] one drink equals one 12 oz bottle of beer (355 mL), one 5 oz glass of wine (148 mL), or one 1 oz glass of hard liquor (44 mL). General instructions  Schedule regular health, dental, and eye exams.  Stay current with your vaccines.  Tell your health care provider if: ? You often feel depressed. ? You have ever been abused or do not feel safe at home. Summary  Adopting a healthy lifestyle and getting preventive care are important in promoting health and wellness.  Follow your health care provider's instructions about healthy  diet, exercising, and getting tested or screened for diseases.  Follow your health care provider's instructions on monitoring your cholesterol and blood pressure. This information is not intended to replace advice given to you by your health care provider. Make sure you discuss any questions you have with your health care provider. Document Revised: 07/03/2018 Document Reviewed: 07/03/2018 Elsevier Patient Education  2021 [employer] Pneumococcal Conjugate Vaccine (PCV13): What You Need to Know 1. Why get vaccinated? Pneumococcal conjugate vaccine (PCV13) can prevent pneumococcal disease. Pneumococcal disease refers to any illness caused by pneumococcal bacteria. These bacteria can cause many types of illnesses, including pneumonia, which is an infection of the lungs. Pneumococcal bacteria are one of the most common causes of pneumonia. Besides pneumonia, pneumococcal bacteria can also cause:  Ear infections  Sinus infections  Meningitis (infection of the tissue covering the brain and spinal cord)  Bacteremia (infection of the blood) Anyone can get pneumococcal disease, but children under 98 years old, people with certain medical conditions, adults 42  years or older, and cigarette smokers are at the highest risk. Most pneumococcal infections are mild. However, some can result in long-term problems, such as brain damage or hearing loss. Meningitis, bacteremia, and pneumonia caused by pneumococcal disease can be fatal. 2. PCV13 PCV13 protects against 13 types of bacteria that cause pneumococcal disease. Infants and young children usually need 4 doses of pneumococcal conjugate vaccine, at ages 30, 68, 66, and 12-15 months. Older children (through age 67 months) may be vaccinated if they did not receive the recommended doses. A dose of PCV13 is also recommended for adults and children 6 years or older with certain medical conditions if they did not already receive PCV13. This vaccine may be given  to healthy adults 65 years or older who did not already receive PCV13, based on discussions between the patient and health care provider. 3. Talk with your health care provider Tell your vaccination provider if the person getting the vaccine:  Has had an allergic reaction after a previous dose of PCV13, to an earlier pneumococcal conjugate vaccine known as PCV7, or to any vaccine containing diphtheria toxoid (for example, DTaP), or has any severe, life-threatening allergies In some cases, your health care provider may decide to postpone PCV13 vaccination until a future visit. People with minor illnesses, such as a cold, may be vaccinated. People who are moderately or severely ill should usually wait until they recover before getting PCV13. Your health care provider can give you more information. 4. Risks of a vaccine reaction  Redness, swelling, pain, or tenderness where the shot is given, and fever, loss of appetite, fussiness (irritability), feeling tired, headache, and chills can happen after PCV13 vaccination. Young children may be at increased risk for seizures caused by fever after PCV13 if it is administered at the same time as inactivated influenza vaccine. Ask your health care provider for more information. People sometimes faint after medical procedures, including vaccination. Tell your provider if you feel dizzy or have vision changes or ringing in the ears. As with any medicine, there is a very remote chance of a vaccine causing a severe allergic reaction, other serious injury, or death. 5. What if there is a serious problem? An allergic reaction could occur after the vaccinated person leaves the clinic. If you see signs of a severe allergic reaction (hives, swelling of the face and throat, difficulty breathing, a fast heartbeat, dizziness, or weakness), call 9-1-1 and get the person to the nearest hospital. For other signs that concern you, call your health care provider. Adverse  reactions should be reported to the Vaccine Adverse Event Reporting System (VAERS). Your health care provider will usually file this report, or you can do it yourself. Visit the VAERS website at www.vaers.LAgents.no or call 4308877796. VAERS is only for reporting reactions, and VAERS staff members do not give medical advice. 6. The National Vaccine Injury Compensation Program The [employer] Vaccine Injury Compensation Program (VICP) is a federal program that was created to compensate people who may have been injured by certain vaccines. Claims regarding alleged injury or death due to vaccination have a time limit for filing, which may be as short as two years. Visit the VICP website at SpiritualWord.at or call 435-545-4177 to learn about the program and about filing a claim. 7. How can I learn more?  Ask your health care provider.  Call your local or state health department.  Visit the website of the Food and Drug Administration (FDA) for vaccine package inserts and additional information at FinderList.no.  Contact the Centers for Disease Control and Prevention (CDC): ? Call 650 124 0395 (1-800-CDC-INFO) or ? Visit CDC's website at PicCapture.uy. Vaccine Information Statement PCV13 (02/27/2020) This information is not intended to replace advice given to you by your health care provider. Make sure you discuss any questions you have with your health care provider. Document Revised: 04/15/2020 Document Reviewed: 04/15/2020 Elsevier Patient Education  2021 [employer].

## 2020-12-03 NOTE — Progress Notes (Signed)
BP 135/80   Pulse 67   Temp 98.2 F (36.8 C)   Ht 5' 7.5" (1.715 m)   Wt (!) 361 lb 3.2 oz (163.8 kg)   SpO2 98%   BMI 55.74 kg/m    Subjective:    Patient ID: Mary Hardin, female    DOB: 1956-02-01, 65 y.o.   MRN: [id number]  HPI: Mary Hardin is a 65 y.o. female presenting on 12/03/2020 for comprehensive medical examination. Current medical complaints include:  HYPERTENSION Hypertension status: controlled  Satisfied with current treatment? yes Duration of hypertension: chronic BP monitoring frequency:  not checking BP medication side effects:  no Medication compliance: excellent compliance Previous BP meds: losartan Aspirin: no Recurrent headaches: no Visual changes: no Palpitations: no Dyspnea: no Chest pain: no Lower extremity edema: no Dizzy/lightheaded: no  DEPRESSION Mood status: controlled Satisfied with current treatment?: yes Symptom severity: mild  Duration of current treatment : chronic Side effects: no Medication compliance: excellent  Psychotherapy/counseling: no  Previous psychiatric medications: lexapro Depressed mood: no Anxious mood: no Anhedonia: no Significant weight loss or gain: no Insomnia: no  Fatigue: no Feelings of worthlessness or guilt: no Impaired concentration/indecisiveness: no Suicidal ideations: no Hopelessness: no Crying spells: no Depression screen [facility] 2/9 12/03/2020 05/28/2020 10/27/2019 06/11/2018 11/27/2017  Decreased Interest 0 0 0 0 0  Down, Depressed, Hopeless 0 0 0 0 0  PHQ - 2 Score 0 0 0 0 0  Altered sleeping 1 0 0 0 -  Tired, decreased energy 0 0 0 0 -  Change in appetite 0 0 0 0 -  Feeling bad or failure about yourself  0 0 0 0 -  Trouble concentrating 0 0 0 0 -  Moving slowly or fidgety/restless 0 0 0 0 -  Suicidal thoughts 0 0 0 0 -  PHQ-9 Score 1 0 0 0 -  Difficult doing work/chores Not difficult at all Not difficult at all Not difficult at all - -   She currently lives with: mother Menopausal  Symptoms: no  Depression Screen done today and results listed below:  Depression screen [facility] 2/9 12/03/2020 05/28/2020 10/27/2019 06/11/2018 11/27/2017  Decreased Interest 0 0 0 0 0  Down, Depressed, Hopeless 0 0 0 0 0  PHQ - 2 Score 0 0 0 0 0  Altered sleeping 1 0 0 0 -  Tired, decreased energy 0 0 0 0 -  Change in appetite 0 0 0 0 -  Feeling bad or failure about yourself  0 0 0 0 -  Trouble concentrating 0 0 0 0 -  Moving slowly or fidgety/restless 0 0 0 0 -  Suicidal thoughts 0 0 0 0 -  PHQ-9 Score 1 0 0 0 -  Difficult doing work/chores Not difficult at all Not difficult at all Not difficult at all - -    Past Medical History:  Past Medical History:  Diagnosis Date  . Anemia   . Anxiety   . Arthritis   . Asthma   . Depression   . Family history of adverse reaction to anesthesia    Pt mother has PONV  . Family history of malignant neoplasm of breast   . Fatty liver   . GERD (gastroesophageal reflux disease)   . Headache   . Hernia of abdominal wall   . Hypertension   . PONV (postoperative nausea and vomiting)   . Sleep apnea    wears cpap    Surgical History:  Past Surgical History:  Procedure  Laterality Date  . CHOLECYSTECTOMY    . COLONOSCOPY N/A 08/31/2017   Procedure: COLONOSCOPY;  Surgeon: Wonda Horner, MD;  Location: [facility] ENDOSCOPY;  Service: Endoscopy;  Laterality: N/A;  . COLONOSCOPY W/ BIOPSIES AND POLYPECTOMY    . DILATION AND CURETTAGE OF UTERUS     x2  . ESOPHAGOGASTRODUODENOSCOPY N/A 08/31/2017   Procedure: ESOPHAGOGASTRODUODENOSCOPY (EGD);  Surgeon: Wonda Horner, MD;  Location: [facility] ENDOSCOPY;  Service: Endoscopy;  Laterality: N/A;  . EYE SURGERY     lasik  . VARICOSE VEIN SURGERY    . wisdom teeth extraction      Medications:  Current Outpatient Medications on File Prior to Visit  Medication Sig  . Cholecalciferol (VITAMIN D3) 5000 units CAPS Take 5,000 Units by mouth daily.  Marland Kitchen docusate sodium (COLACE) 100 MG capsule Take 100 mg by mouth daily.  Marland Kitchen  LORazepam (ATIVAN) 1 MG tablet Take 1 tablet (1 mg total) by mouth daily as needed for anxiety.  . mupirocin ointment (BACTROBAN) 2 % APPLY TO AFFECTED AREAS 3 TIMES DAILY  . OVER THE COUNTER MEDICATION Plexus Weight Loss  . scopolamine (TRANSDERM-SCOP, 1.5 MG,) 1 MG/3DAYS Place 1 patch (1.5 mg total) onto the skin every 3 (three) days. (Patient not taking: No sig reported)   No current facility-administered medications on file prior to visit.    Allergies:  Allergies  Allergen Reactions  . Shellfish Allergy     swelling  . Shellfish-Derived Products     swelling    Social History:  Social History   Socioeconomic History  . Marital status: Widowed    Spouse name: Not on file  . Number of children: 0  . Years of education: Not on file  . Highest education level: Not on file  Occupational History  . Not on file  Tobacco Use  . Smoking status: Never Smoker  . Smokeless tobacco: Never Used  Vaping Use  . Vaping Use: Never used  Substance and Sexual Activity  . Alcohol use: Yes    Comment: 1-4 glasses of wine per week  . Drug use: No  . Sexual activity: Not Currently  Other Topics Concern  . Not on file  Social History Narrative  . Not on file   Social Determinants of Health   Financial Resource Strain: Not on file  Food Insecurity: Not on file  Transportation Needs: Not on file  Physical Activity: Not on file  Stress: Not on file  Social Connections: Not on file  Intimate Partner Violence: Not on file   Social History   Tobacco Use  Smoking Status Never Smoker  Smokeless Tobacco Never Used   Social History   Substance and Sexual Activity  Alcohol Use Yes   Comment: 1-4 glasses of wine per week    Family History:  Family History  Problem Relation Age of Onset  . Breast cancer Maternal Grandmother 47       deceased 73  . Diabetes Maternal Grandmother   . Pancreatic cancer Paternal Grandmother 72  . Breast cancer Maternal Aunt 30       currently 18   . Thyroid cancer Cousin        dx in her 12s  . Breast cancer Mother 36       DCIS; currently 40  . Hypertension Mother   . Arrhythmia Mother        A-fib  . Stroke Father   . Hyperlipidemia Father   . Seizures Father   . Cancer Brother  Past medical history, surgical history, medications, allergies, family history and social history reviewed with patient today and changes made to appropriate areas of the chart.   Review of Systems  Constitutional: Negative.   HENT: Negative.   Eyes: Negative.   Respiratory: Negative.   Cardiovascular: Negative.   Gastrointestinal: Negative.   Genitourinary: Negative.   Musculoskeletal: Negative.   Skin: Negative.   Neurological: Negative.   Endo/Heme/Allergies: Positive for environmental allergies. Negative for polydipsia. Does not bruise/bleed easily.  Psychiatric/Behavioral: Negative.        +grief   All other ROS negative except what is listed above and in the HPI.      Objective:    BP 135/80   Pulse 67   Temp 98.2 F (36.8 C)   Ht 5' 7.5" (1.715 m)   Wt (!) 361 lb 3.2 oz (163.8 kg)   SpO2 98%   BMI 55.74 kg/m   Wt Readings from Last 3 Encounters:  12/03/20 (!) 361 lb 3.2 oz (163.8 kg)  06/24/20 (!) 346 lb 3.2 oz (157 kg)  05/28/20 (!) 343 lb (155.6 kg)    Physical Exam Vitals and nursing note reviewed.  Constitutional:      General: She is not in acute distress.    Appearance: Normal appearance. She is obese. She is not ill-appearing, toxic-appearing or diaphoretic.  HENT:     Head: Normocephalic and atraumatic.     Right Ear: Tympanic membrane, ear canal and external ear normal. There is no impacted cerumen.     Left Ear: Tympanic membrane, ear canal and external ear normal. There is no impacted cerumen.     Nose: Nose normal. No congestion or rhinorrhea.     Mouth/Throat:     Mouth: Mucous membranes are moist.     Pharynx: Oropharynx is clear. No oropharyngeal exudate or posterior oropharyngeal erythema.   Eyes:     General: No scleral icterus.       Right eye: No discharge.        Left eye: No discharge.     Extraocular Movements: Extraocular movements intact.     Conjunctiva/sclera: Conjunctivae normal.     Pupils: Pupils are equal, round, and reactive to light.  Neck:     Vascular: No carotid bruit.  Cardiovascular:     Rate and Rhythm: Normal rate and regular rhythm.     Pulses: Normal pulses.     Heart sounds: No murmur heard. No friction rub. No gallop.   Pulmonary:     Effort: Pulmonary effort is normal. No respiratory distress.     Breath sounds: Normal breath sounds. No stridor. No wheezing, rhonchi or rales.  Chest:     Chest wall: No tenderness.  Abdominal:     General: Abdomen is flat. Bowel sounds are normal. There is no distension.     Palpations: Abdomen is soft. There is no mass.     Tenderness: There is no abdominal tenderness. There is no right CVA tenderness, left CVA tenderness, guarding or rebound.     Hernia: No hernia is present.  Genitourinary:    Comments: Breast and pelvic exams deferred with shared decision making Musculoskeletal:        General: No swelling, tenderness, deformity or signs of injury.     Cervical back: Normal range of motion and neck supple. No rigidity. No muscular tenderness.     Right lower leg: No edema.     Left lower leg: No edema.  Lymphadenopathy:  Cervical: No cervical adenopathy.  Skin:    General: Skin is warm and dry.     Capillary Refill: Capillary refill takes less than 2 seconds.     Coloration: Skin is not jaundiced or pale.     Findings: No bruising, erythema, lesion or rash.  Neurological:     General: No focal deficit present.     Mental Status: She is alert and oriented to person, place, and time. Mental status is at baseline.     Cranial Nerves: No cranial nerve deficit.     Sensory: No sensory deficit.     Motor: No weakness.     Coordination: Coordination normal.     Gait: Gait normal.     Deep Tendon  Reflexes: Reflexes normal.  Psychiatric:        Mood and Affect: Mood normal.        Behavior: Behavior normal.        Thought Content: Thought content normal.        Judgment: Judgment normal.     Results for orders placed or performed in visit on 12/03/20  CBC with Differential/Platelet  Result Value Ref Range   WBC 7.8 3.4 - 10.8 x10E3/uL   RBC 4.16 3.77 - 5.28 x10E6/uL   Hemoglobin 12.0 11.1 - 15.9 g/dL   Hematocrit 36.5 34.0 - 46.6 %   MCV 88 79 - 97 fL   MCH 28.8 26.6 - 33.0 pg   MCHC 32.9 31.5 - 35.7 g/dL   RDW 12.8 11.7 - 15.4 %   Platelets 229 150 - 450 x10E3/uL   Neutrophils 60 Not Estab. %   Lymphs 26 Not Estab. %   Monocytes 7 Not Estab. %   Eos 6 Not Estab. %   Basos 1 Not Estab. %   Neutrophils Absolute 4.7 1.4 - 7.0 x10E3/uL   Lymphocytes Absolute 2.0 0.7 - 3.1 x10E3/uL   Monocytes Absolute 0.6 0.1 - 0.9 x10E3/uL   EOS (ABSOLUTE) 0.4 0.0 - 0.4 x10E3/uL   Basophils Absolute 0.0 0.0 - 0.2 x10E3/uL   Immature Granulocytes 0 Not Estab. %   Immature Grans (Abs) 0.0 0.0 - 0.1 x10E3/uL  Comprehensive metabolic panel  Result Value Ref Range   Glucose 97 65 - 99 mg/dL   BUN 15 8 - 27 mg/dL   Creatinine, Ser 0.78 0.57 - 1.00 mg/dL   eGFR 85 >59 mL/min/1.73   BUN/Creatinine Ratio 19 12 - 28   Sodium 140 134 - 144 mmol/L   Potassium 4.6 3.5 - 5.2 mmol/L   Chloride 102 96 - 106 mmol/L   CO2 21 20 - 29 mmol/L   Calcium 9.4 8.7 - 10.3 mg/dL   Total Protein 6.9 6.0 - 8.5 g/dL   Albumin 4.4 3.8 - 4.8 g/dL   Globulin, Total 2.5 1.5 - 4.5 g/dL   Albumin/Globulin Ratio 1.8 1.2 - 2.2   Bilirubin Total 0.2 0.0 - 1.2 mg/dL   Alkaline Phosphatase 65 44 - 121 IU/L   AST 19 0 - 40 IU/L   ALT 11 0 - 32 IU/L  Lipid Panel w/o Chol/HDL Ratio  Result Value Ref Range   Cholesterol, Total 154 100 - 199 mg/dL   Triglycerides 133 0 - 149 mg/dL   HDL 60 >39 mg/dL   VLDL Cholesterol Cal 23 5 - 40 mg/dL   LDL Chol Calc (NIH) 71 0 - 99 mg/dL  Urinalysis, Routine w reflex  microscopic  Result Value Ref Range   Specific Gravity, UA <1.005 (L)  1.005 - 1.030   pH, UA 6.0 5.0 - 7.5   Color, UA Yellow Yellow   Appearance Ur Clear Clear   Leukocytes,UA Negative Negative   Protein,UA Negative Negative/Trace   Glucose, UA Negative Negative   Ketones, UA Negative Negative   RBC, UA Negative Negative   Bilirubin, UA Negative Negative   Urobilinogen, Ur 0.2 0.2 - 1.0 mg/dL   Nitrite, UA Negative Negative  TSH  Result Value Ref Range   TSH 2.690 0.450 - 4.500 uIU/mL  Microalbumin, Urine Waived  Result Value Ref Range   Microalb, Ur Waived 10 0 - 19 mg/L   Creatinine, Urine Waived 10 10 - 300 mg/dL   Microalb/Creat Ratio <30 <30 mg/g      Assessment & Plan:   Problem List Items Addressed This Visit      Cardiovascular and Mediastinum   HTN (hypertension)    Under good control on current regimen. Continue current regimen. Continue to monitor. Call with any concerns. Refills given. Labs drawn today.        Relevant Medications   losartan (COZAAR) 25 MG tablet   Other Relevant Orders   CBC with Differential/Platelet (Completed)   Comprehensive metabolic panel (Completed)   Urinalysis, Routine w reflex microscopic (Completed)   TSH (Completed)   Microalbumin, Urine Waived (Completed)   Atherosclerosis of aorta (HCC)    Will keep BP and cholesterol under good control. Continue to monitor. Call with any concerns.       Relevant Medications   losartan (COZAAR) 25 MG tablet   Other Relevant Orders   CBC with Differential/Platelet (Completed)   Comprehensive metabolic panel (Completed)   Lipid Panel w/o Chol/HDL Ratio (Completed)     Digestive   Acid reflux    Under good control on current regimen. Continue current regimen. Continue to monitor. Call with any concerns. Refills given. Labs drawn today.        Relevant Medications   esomeprazole (NEXIUM) 40 MG capsule   Other Relevant Orders   CBC with Differential/Platelet (Completed)     Other    Depression, recurrent ([place])    Under good control on current regimen. Continue current regimen. Continue to monitor. Call with any concerns. Refills given. Labs drawn today.        Relevant Medications   escitalopram (LEXAPRO) 20 MG tablet   Other Relevant Orders   CBC with Differential/Platelet (Completed)   TSH (Completed)    Other Visit Diagnoses    Routine general medical examination at a health care facility    -  Primary   Vaccines up to date. Screening labs checked today. Pap done today. Mammogram ordered today. Continue diet and exercise. Call with any concerns.    Screening for cervical cancer       Pap done today. Await results.    Encounter for screening mammogram for malignant neoplasm of breast       Mammogram ordered. Continue to monitor.        Follow up plan: Return in about 6 months (around 06/05/2021) for records release Dr. Corinna Capra.   LABORATORY TESTING:  - Pap smear: done elsewhere  IMMUNIZATIONS:   - Tdap: Tetanus vaccination status reviewed: last tetanus booster within 10 years. - Influenza: Postponed to flu season - Pneumovax: Administered today - COVID: Up to date  SCREENING: -Mammogram: Done elsewhere  - Colonoscopy: Up to date   PATIENT COUNSELING:   Advised to take 1 mg of folate supplement per day if capable of pregnancy.  Sexuality: Discussed sexually transmitted diseases, partner selection, use of condoms, avoidance of unintended pregnancy  and contraceptive alternatives.   Advised to avoid cigarette smoking.  I discussed with the patient that most people either abstain from alcohol or drink within safe limits (<=14/week and <=4 drinks/occasion for males, <=7/weeks and <= 3 drinks/occasion for females) and that the risk for alcohol disorders and other health effects rises proportionally with the number of drinks per week and how often a drinker exceeds daily limits.  Discussed cessation/primary prevention of drug use and availability of  treatment for abuse.   Diet: Encouraged to adjust caloric intake to maintain  or achieve ideal body weight, to reduce intake of dietary saturated fat and total fat, to limit sodium intake by avoiding high sodium foods and not adding table salt, and to maintain adequate dietary potassium and calcium preferably from fresh fruits, vegetables, and low-fat dairy products.    stressed the importance of regular exercise  Injury prevention: Discussed safety belts, safety helmets, smoke detector, smoking near bedding or upholstery.   Dental health: Discussed importance of regular tooth brushing, flossing, and dental visits.    NEXT PREVENTATIVE PHYSICAL DUE IN 1 YEAR. Return in about 6 months (around 06/05/2021) for records release Dr. Corinna Capra.

## 2020-12-04 LAB — CBC WITH DIFFERENTIAL/PLATELET
Basophils Absolute: 0 10*3/uL (ref 0.0–0.2)
Basos: 1 %
EOS (ABSOLUTE): 0.4 10*3/uL (ref 0.0–0.4)
Eos: 6 %
Hematocrit: 36.5 % (ref 34.0–46.6)
Hemoglobin: 12 g/dL (ref 11.1–15.9)
Immature Grans (Abs): 0 10*3/uL (ref 0.0–0.1)
Immature Granulocytes: 0 %
Lymphocytes Absolute: 2 10*3/uL (ref 0.7–3.1)
Lymphs: 26 %
MCH: 28.8 pg (ref 26.6–33.0)
MCHC: 32.9 g/dL (ref 31.5–35.7)
MCV: 88 fL (ref 79–97)
Monocytes Absolute: 0.6 10*3/uL (ref 0.1–0.9)
Monocytes: 7 %
Neutrophils Absolute: 4.7 10*3/uL (ref 1.4–7.0)
Neutrophils: 60 %
Platelets: 229 10*3/uL (ref 150–450)
RBC: 4.16 x10E6/uL (ref 3.77–5.28)
RDW: 12.8 % (ref 11.7–15.4)
WBC: 7.8 10*3/uL (ref 3.4–10.8)

## 2020-12-04 LAB — LIPID PANEL W/O CHOL/HDL RATIO
Cholesterol, Total: 154 mg/dL (ref 100–199)
HDL: 60 mg/dL (ref >39)
LDL Chol Calc (NIH): 71 mg/dL (ref 0–99)
Triglycerides: 133 mg/dL (ref 0–149)
VLDL Cholesterol Cal: 23 mg/dL (ref 5–40)

## 2020-12-04 LAB — COMPREHENSIVE METABOLIC PANEL
ALT: 11 IU/L (ref 0–32)
AST: 19 IU/L (ref 0–40)
Albumin/Globulin Ratio: 1.8 (ref 1.2–2.2)
Albumin: 4.4 g/dL (ref 3.8–4.8)
Alkaline Phosphatase: 65 IU/L (ref 44–121)
BUN/Creatinine Ratio: 19 (ref 12–28)
BUN: 15 mg/dL (ref 8–27)
Bilirubin Total: 0.2 mg/dL (ref 0.0–1.2)
CO2: 21 mmol/L (ref 20–29)
Calcium: 9.4 mg/dL (ref 8.7–10.3)
Chloride: 102 mmol/L (ref 96–106)
Creatinine, Ser: 0.78 mg/dL (ref 0.57–1.00)
Globulin, Total: 2.5 g/dL (ref 1.5–4.5)
Glucose: 97 mg/dL (ref 65–99)
Potassium: 4.6 mmol/L (ref 3.5–5.2)
Sodium: 140 mmol/L (ref 134–144)
Total Protein: 6.9 g/dL (ref 6.0–8.5)
eGFR: 85 mL/min/{1.73_m2} (ref >59)

## 2020-12-04 LAB — TSH: TSH: 2.69 u[IU]/mL (ref 0.450–4.500)

## 2020-12-05 NOTE — Assessment & Plan Note (Signed)
Will keep BP and cholesterol under good control. Continue to monitor. Call with any concerns.  

## 2020-12-05 NOTE — Assessment & Plan Note (Signed)
Under good control on current regimen. Continue current regimen. Continue to monitor. Call with any concerns. Refills given. Labs drawn today.   

## 2020-12-07 ENCOUNTER — Telehealth: Payer: Self-pay

## 2020-12-07 NOTE — Telephone Encounter (Signed)
Pt would like to know if her 6 month f.u in November if this could be virtual or would it have to be in office. Pt stated she preferred virtual. Please advise.

## 2020-12-07 NOTE — Telephone Encounter (Signed)
Pt verbalized understanding.

## 2020-12-07 NOTE — Telephone Encounter (Signed)
We can do a virtual but she will need labs- so her choice.

## 2021-01-04 LAB — HM MAMMOGRAPHY

## 2021-04-03 IMAGING — MR MR BILATERAL BREAST WITHOUT AND WITH CONTRAST
8 of 12 series · 33 of 48 positions shown · IV contrast (10 ml gadavist)
Comparison: Previous exam(s).

CLINICAL DATA: 62-year-old female presenting for high risk
screening MRI due to family history. Her mother was diagnosed with
breast cancer at the age of 65, a maternal grandmother at age of 58
and a maternal aunt at the age of 50.

LABS:  Creatinine was obtained on site at [HOSPITAL] at [REDACTED] [HOSPITAL].
Results: Creatinine 0.7 mg/dL and GFR of 94.
EXAM:
BILATERAL BREAST MRI WITH AND WITHOUT CONTRAST
TECHNIQUE: Multiplanar, multisequence MR images of both breasts were obtained
prior to and following the intravenous administration of 10 ml of
Gadavist

[Series 2: t2_tirm_tra ipat (a-p) · axial · 3.0mm · 0.80mm/px · 1 of 55 slices shown]
[im 1/55]
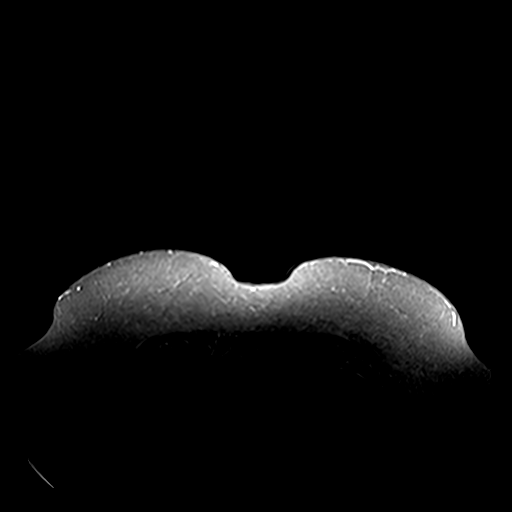

[Series 3: fl3d pre-cm no · axial · non-contrast · 1.2mm · 1.07mm/px · z∈[-57,+114]mm · 5 of 144 slices shown]
[im 1/144]
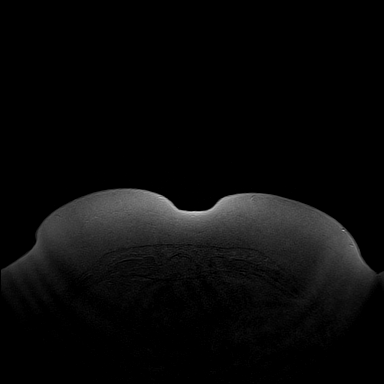
[im 36/144]
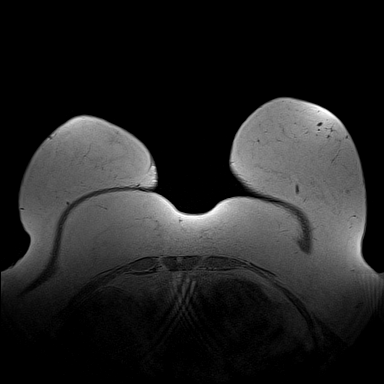
[im 72/144]
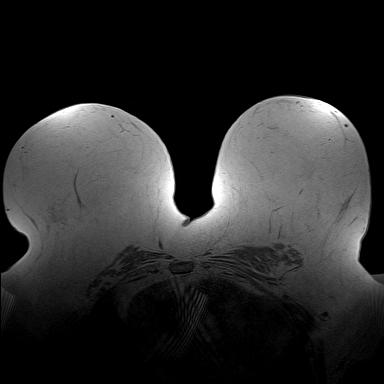
[im 108/144]
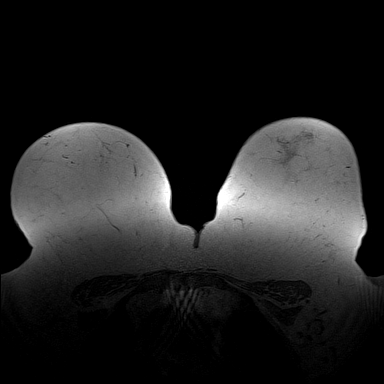
[im 144/144]
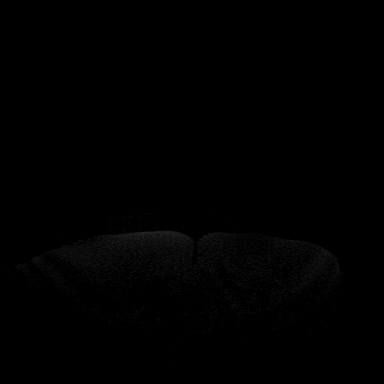

[Series 4: fl3d pre-cm · axial · non-contrast · 1.2mm · 1.07mm/px · z∈[-57,+114]mm · 5 of 144 slices shown]
[im 1/144]
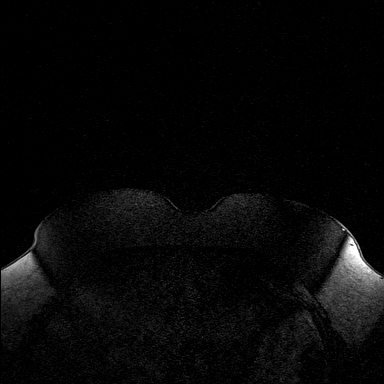
[im 36/144]
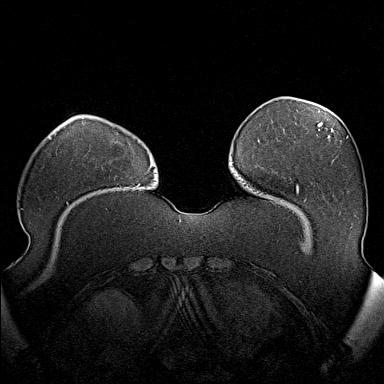
[im 72/144]
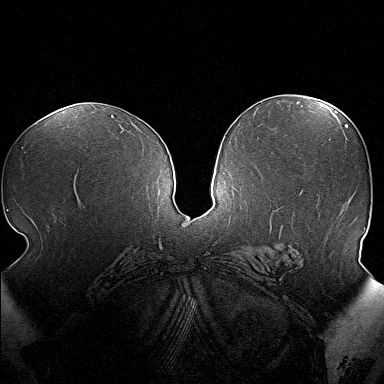
[im 108/144]
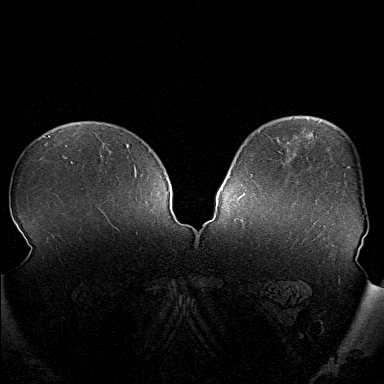
[im 144/144]
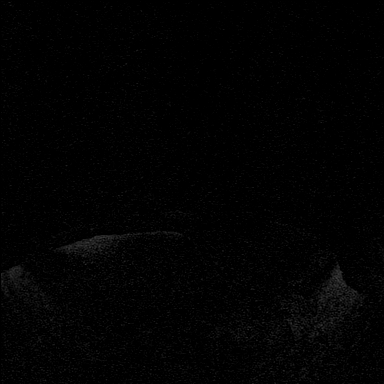

[Series 5: fl3d post-cm 20 · axial · 1.2mm · 1.07mm/px · z∈[-57,+114]mm · 5 of 144 slices shown (1 of 3)]
[im 1/144]
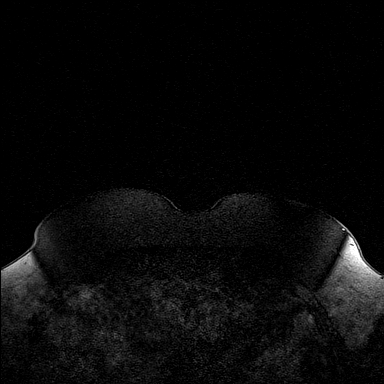
[im 36/144]
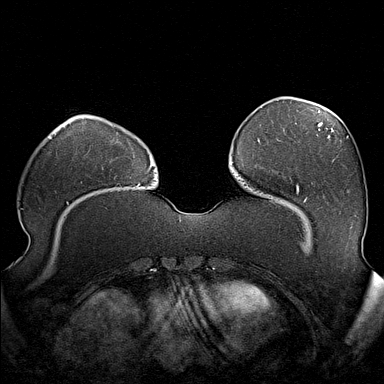
[im 72/144]
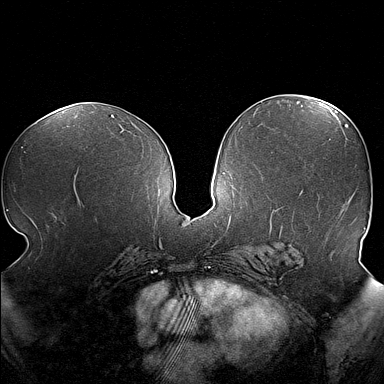
[im 108/144]
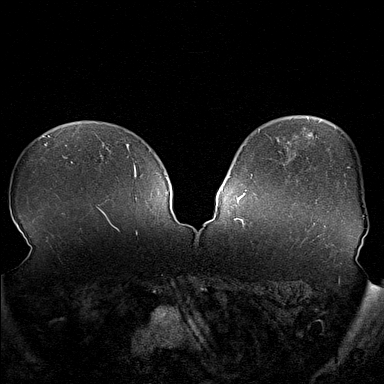
[im 144/144]
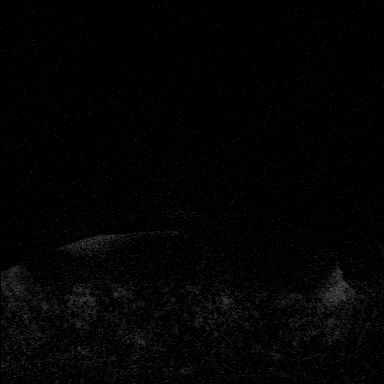

[Series 6: fl3d post-cm 20 · axial · 1.2mm · 1.07mm/px · z∈[-57,+114]mm · 5 of 144 slices shown (2 of 3)]
[im 1/144]
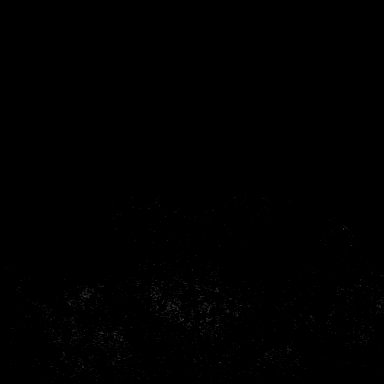
[im 36/144]
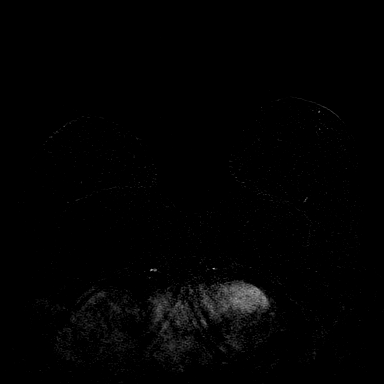
[im 72/144]
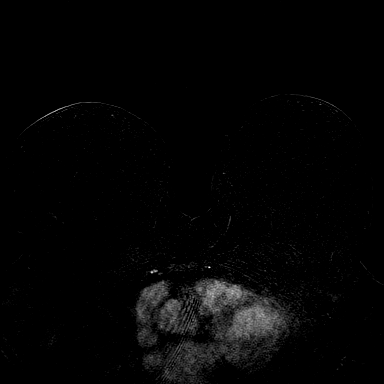
[im 108/144]
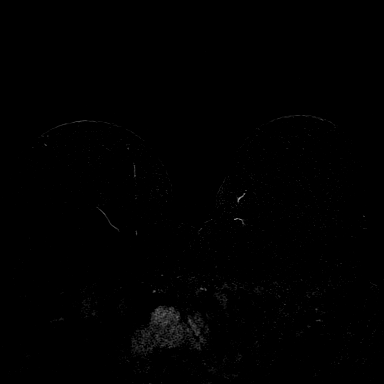
[im 144/144]
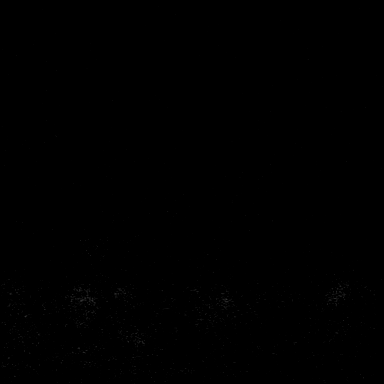

[Series 7: fl3d post-cm 20 · axial · 172.8mm · 1.07mm/px · 1 of 1 slices shown (3 of 3)]
[im 1/1]
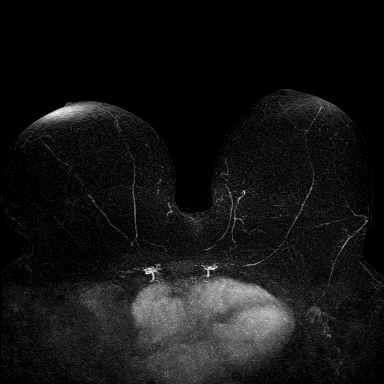

[Series 8: fl3d post-cm 3min · axial · 1.2mm · 1.07mm/px · z∈[-57,+114]mm · 6 of 144 slices shown]
[im 1/144]
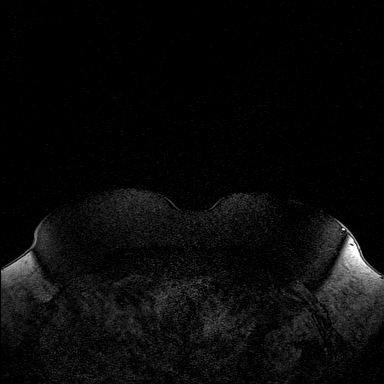
[im 29/144]
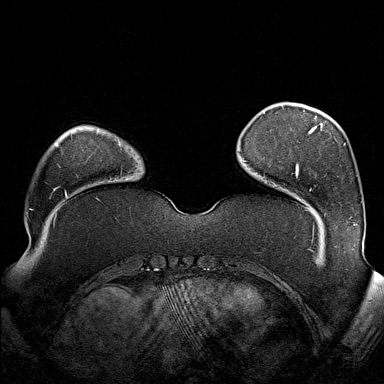
[im 58/144]
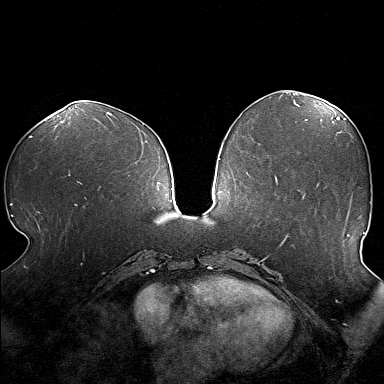
[im 86/144]
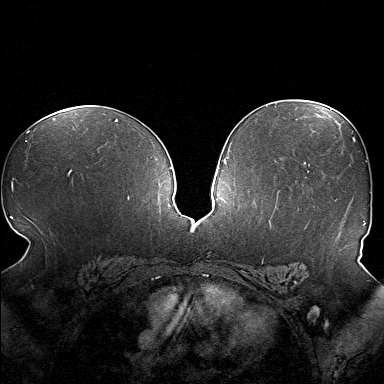
[im 115/144]
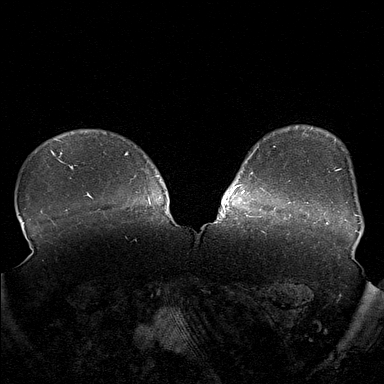
[im 144/144]
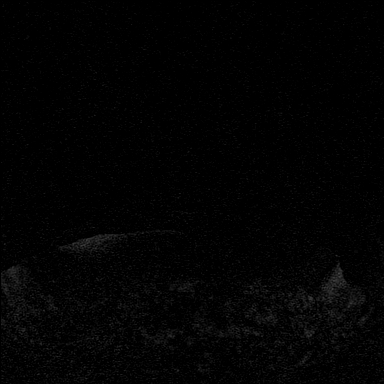

[Series 9: fl3d post-cm 3min_sub · axial · 1.2mm · 1.07mm/px · z∈[-57,+80]mm · 5 of 144 slices shown]
[im 1/144]
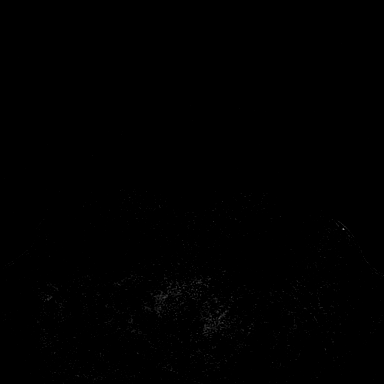
[im 29/144]
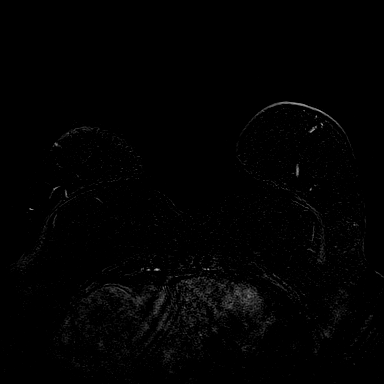
[im 58/144]
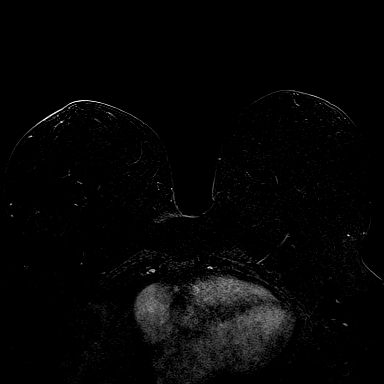
[im 86/144]
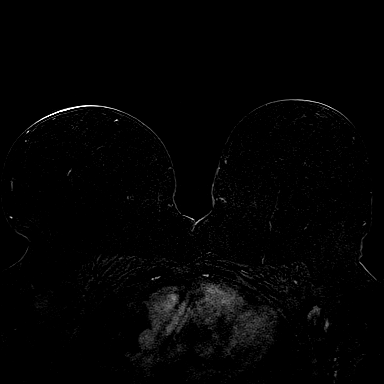
[im 115/144]
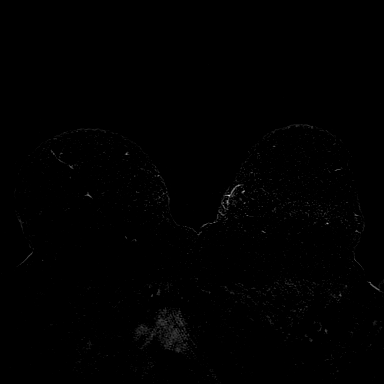

[33 of 48 positions shown; findings below may reference images not displayed]

Three-dimensional MR images were rendered by post-processing of the
original MR data on an independent workstation. The
three-dimensional MR images were interpreted, and findings are
reported in the following complete MRI report for this study. Three
dimensional images were evaluated at the independent [employer]
workstation
FINDINGS: Breast composition: a. Almost entirely fat.

Background parenchymal enhancement: Minimal.

Right breast: No mass or abnormal enhancement.

Left breast: No mass or abnormal enhancement.

Lymph nodes: No abnormal appearing lymph nodes.

Ancillary findings:  None.
IMPRESSION: No evidence of malignancy in the bilateral breasts.

RECOMMENDATION:
1.  Annual screening MRI will be due in Wednesday October, 2019.

2.  High risk screening MRI is recommended in 12 months.

BI-RADS CATEGORY  1: Negative.

## 2021-04-26 ENCOUNTER — Other Ambulatory Visit: Payer: Self-pay | Admitting: Family Medicine

## 2021-04-27 NOTE — Telephone Encounter (Signed)
Requested medication (s) are due for refill today:   Yes  Requested medication (s) are on the active medication list:   Yes  Future visit scheduled:   Yes on 11/10 with Laural Benes   Last ordered: 12/03/2020 #90, 1 refill    Returned because a 1 yr supply is being requested for mail order phar.   Requested Prescriptions  Pending Prescriptions Disp Refills   losartan (COZAAR) 25 MG tablet [Pharmacy Med Name: Losartan Potassium 25 MG Oral Tablet] 90 tablet 3    Sig: TAKE 1 TABLET BY MOUTH  DAILY     Cardiovascular:  Angiotensin Receptor Blockers Passed - 04/26/2021 10:29 PM      Passed - Cr in normal range and within 180 days    Creatinine, Ser  Date Value Ref Range Status  12/03/2020 0.78 0.57 - 1.00 mg/dL Final          Passed - K in normal range and within 180 days    Potassium  Date Value Ref Range Status  12/03/2020 4.6 3.5 - 5.2 mmol/L Final          Passed - Patient is not pregnant      Passed - Last BP in normal range    BP Readings from Last 1 Encounters:  12/03/20 135/80          Passed - Valid encounter within last 6 months    Recent Outpatient Visits           4 months ago Routine general medical examination at a health care facility   [facility], [place] P, DO   6 months ago Allergic contact dermatitis due to plants, except food   [facility] [place], [place], DO   10 months ago Primary hypertension   [facility] [place], [place], DO   11 months ago Atherosclerosis of aorta [facility])   [facility] [place], Megan P, DO   1 year ago Routine general medical examination at a health care facility   [facility] [place], Oralia Rud, DO       Future Appointments             In 1 month Laural Benes, Oralia Rud, DO [facility], [facility]

## 2021-05-10 ENCOUNTER — Other Ambulatory Visit: Payer: Self-pay | Admitting: Family Medicine

## 2021-05-10 DIAGNOSIS — F339 Major depressive disorder, recurrent, unspecified: Secondary | ICD-10-CM

## 2021-05-11 NOTE — Telephone Encounter (Signed)
Requested Prescriptions  Pending Prescriptions Disp Refills  . escitalopram (LEXAPRO) 20 MG tablet [Pharmacy Med Name: Escitalopram Oxalate 20 MG Oral Tablet] 90 tablet 0    Sig: TAKE 1 TABLET BY MOUTH  DAILY     Psychiatry:  Antidepressants - SSRI Passed - 05/10/2021 10:04 PM      Passed - Completed PHQ-2 or PHQ-9 in the last 360 days      Passed - Valid encounter within last 6 months    Recent Outpatient Visits          5 months ago Routine general medical examination at a health care facility   [facility], [place] P, DO   6 months ago Allergic contact dermatitis due to plants, except food   [facility] [place], Megan P, DO   10 months ago Primary hypertension   [facility] [place], [place]y Rose, DO   11 months ago Atherosclerosis of aorta [facility])   [facility] [place], Megan P, DO   1 year ago Routine general medical examination at a health care facility   [facility] [place], Oralia Rud, DO      Future Appointments            In 3 weeks Laural Benes, Oralia Rud, DO [facility], [facility]

## 2021-06-02 ENCOUNTER — Ambulatory Visit (INDEPENDENT_AMBULATORY_CARE_PROVIDER_SITE_OTHER): Payer: No Typology Code available for payment source | Admitting: Family Medicine

## 2021-06-02 ENCOUNTER — Encounter: Payer: Self-pay | Admitting: Family Medicine

## 2021-06-02 ENCOUNTER — Other Ambulatory Visit: Payer: Self-pay

## 2021-06-02 VITALS — BP 148/81 | HR 63 | Temp 98.2°F | Wt 367.4 lb

## 2021-06-02 DIAGNOSIS — Z23 Encounter for immunization: Secondary | ICD-10-CM

## 2021-06-02 DIAGNOSIS — F339 Major depressive disorder, recurrent, unspecified: Secondary | ICD-10-CM | POA: Diagnosis not present

## 2021-06-02 DIAGNOSIS — I10 Essential (primary) hypertension: Secondary | ICD-10-CM

## 2021-06-02 DIAGNOSIS — I700 Atherosclerosis of aorta: Secondary | ICD-10-CM

## 2021-06-02 DIAGNOSIS — F419 Anxiety disorder, unspecified: Secondary | ICD-10-CM

## 2021-06-02 MED ORDER — LORAZEPAM 1 MG PO TABS: 1.0000 mg | ORAL_TABLET | Freq: Every day | ORAL | 0 refills | Status: DC | PRN

## 2021-06-02 MED ORDER — ESCITALOPRAM OXALATE 20 MG PO TABS: 20.0000 mg | ORAL_TABLET | Freq: Every day | ORAL | 1 refills | Status: DC

## 2021-06-02 MED ORDER — ESOMEPRAZOLE MAGNESIUM 40 MG PO CPDR: DELAYED_RELEASE_CAPSULE | ORAL | 1 refills | Status: DC

## 2021-06-02 MED ORDER — NAPROXEN 500 MG PO TABS: 500.0000 mg | ORAL_TABLET | Freq: Two times a day (BID) | ORAL | 1 refills | Status: DC

## 2021-06-02 MED ORDER — LOSARTAN POTASSIUM 25 MG PO TABS: 25.0000 mg | ORAL_TABLET | Freq: Every day | ORAL | 1 refills | Status: DC

## 2021-06-02 NOTE — Assessment & Plan Note (Signed)
Under good control on current regimen. Continue current regimen. Continue to monitor. Call with any concerns. Refills given. 30 lorazepam should last about 6 months.

## 2021-06-02 NOTE — Progress Notes (Signed)
BP (!) 148/81   Pulse 63   Temp 98.2 F (36.8 C)   Wt (!) 367 lb 6.4 oz (166.7 kg)   SpO2 98%   BMI 56.69 kg/m    Subjective:    Patient ID: MARCHEL Hardin, female    DOB: 10/23/1955, 65 y.o.   MRN: [id number]  HPI: Mary Hardin is a 65 y.o. female  Chief Complaint  Patient presents with   Hypertension   Depression   HYPERTENSION Hypertension status: controlled  Satisfied with current treatment? yes Duration of hypertension: chronic BP monitoring frequency:  a few times a month BP range: 115/58 BP medication side effects:  no Medication compliance: excellent compliance Previous BP meds:losartan Aspirin: no Recurrent headaches: no Visual changes: no Palpitations: no Dyspnea: no Chest pain: no Lower extremity edema: no Dizzy/lightheaded: no  DEPRESSION Mood status: controlled Satisfied with current treatment?: yes Symptom severity: mild  Duration of current treatment : chronic Side effects: no Medication compliance: excellent compliance Psychotherapy/counseling: no  Previous psychiatric medications: lexapro and PRN ativan Depressed mood: no Anxious mood: no Anhedonia: no Significant weight loss or gain: no Insomnia: no  Fatigue: no Feelings of worthlessness or guilt: no Impaired concentration/indecisiveness: no Suicidal ideations: no Hopelessness: no Crying spells: no Depression screen [facility] 2/9 06/02/2021 12/03/2020 05/28/2020 10/27/2019 06/11/2018  Decreased Interest 0 0 0 0 0  Down, Depressed, Hopeless 0 0 0 0 0  PHQ - 2 Score 0 0 0 0 0  Altered sleeping 0 1 0 0 0  Tired, decreased energy 0 0 0 0 0  Change in appetite 1 0 0 0 0  Feeling bad or failure about yourself  0 0 0 0 0  Trouble concentrating 0 0 0 0 0  Moving slowly or fidgety/restless 0 0 0 0 0  Suicidal thoughts 0 0 0 0 0  PHQ-9 Score 1 1 0 0 0  Difficult doing work/chores Not difficult at all Not difficult at all Not difficult at all Not difficult at all -    Relevant past  medical, surgical, family and social history reviewed and updated as indicated. Interim medical history since our last visit reviewed. Allergies and medications reviewed and updated.  Review of Systems  Constitutional: Negative.   Respiratory: Negative.    Cardiovascular: Negative.   Gastrointestinal: Negative.   Musculoskeletal: Negative.   Neurological: Negative.   Psychiatric/Behavioral: Negative.     Per HPI unless specifically indicated above     Objective:    BP (!) 148/81   Pulse 63   Temp 98.2 F (36.8 C)   Wt (!) 367 lb 6.4 oz (166.7 kg)   SpO2 98%   BMI 56.69 kg/m   Wt Readings from Last 3 Encounters:  06/02/21 (!) 367 lb 6.4 oz (166.7 kg)  12/03/20 (!) 361 lb 3.2 oz (163.8 kg)  06/24/20 (!) 346 lb 3.2 oz (157 kg)    Physical Exam Vitals and nursing note reviewed.  Constitutional:      General: She is not in acute distress.    Appearance: Normal appearance. She is obese. She is not ill-appearing, toxic-appearing or diaphoretic.  HENT:     Head: Normocephalic and atraumatic.     Right Ear: External ear normal.     Left Ear: External ear normal.     Nose: Nose normal.     Mouth/Throat:     Mouth: Mucous membranes are moist.     Pharynx: Oropharynx is clear.  Eyes:     General: No  scleral icterus.       Right eye: No discharge.        Left eye: No discharge.     Extraocular Movements: Extraocular movements intact.     Conjunctiva/sclera: Conjunctivae normal.     Pupils: Pupils are equal, round, and reactive to light.  Cardiovascular:     Rate and Rhythm: Normal rate and regular rhythm.     Pulses: Normal pulses.     Heart sounds: Normal heart sounds. No murmur heard.   No friction rub. No gallop.  Pulmonary:     Effort: Pulmonary effort is normal. No respiratory distress.     Breath sounds: Normal breath sounds. No stridor. No wheezing, rhonchi or rales.  Chest:     Chest wall: No tenderness.  Musculoskeletal:        General: Normal range of motion.      Cervical back: Normal range of motion and neck supple.  Skin:    General: Skin is warm and dry.     Capillary Refill: Capillary refill takes less than 2 seconds.     Coloration: Skin is not jaundiced or pale.     Findings: No bruising, erythema, lesion or rash.  Neurological:     General: No focal deficit present.     Mental Status: She is alert and oriented to person, place, and time. Mental status is at baseline.  Psychiatric:        Mood and Affect: Mood normal.        Behavior: Behavior normal.        Thought Content: Thought content normal.        Judgment: Judgment normal.    Results for orders placed or performed in visit on 12/03/20  CBC with Differential/Platelet  Result Value Ref Range   WBC 7.8 3.4 - 10.8 x10E3/uL   RBC 4.16 3.77 - 5.28 x10E6/uL   Hemoglobin 12.0 11.1 - 15.9 g/dL   Hematocrit 36.5 34.0 - 46.6 %   MCV 88 79 - 97 fL   MCH 28.8 26.6 - 33.0 pg   MCHC 32.9 31.5 - 35.7 g/dL   RDW 12.8 11.7 - 15.4 %   Platelets 229 150 - 450 x10E3/uL   Neutrophils 60 Not Estab. %   Lymphs 26 Not Estab. %   Monocytes 7 Not Estab. %   Eos 6 Not Estab. %   Basos 1 Not Estab. %   Neutrophils Absolute 4.7 1.4 - 7.0 x10E3/uL   Lymphocytes Absolute 2.0 0.7 - 3.1 x10E3/uL   Monocytes Absolute 0.6 0.1 - 0.9 x10E3/uL   EOS (ABSOLUTE) 0.4 0.0 - 0.4 x10E3/uL   Basophils Absolute 0.0 0.0 - 0.2 x10E3/uL   Immature Granulocytes 0 Not Estab. %   Immature Grans (Abs) 0.0 0.0 - 0.1 x10E3/uL  Comprehensive metabolic panel  Result Value Ref Range   Glucose 97 65 - 99 mg/dL   BUN 15 8 - 27 mg/dL   Creatinine, Ser 0.78 0.57 - 1.00 mg/dL   eGFR 85 >59 mL/min/1.73   BUN/Creatinine Ratio 19 12 - 28   Sodium 140 134 - 144 mmol/L   Potassium 4.6 3.5 - 5.2 mmol/L   Chloride 102 96 - 106 mmol/L   CO2 21 20 - 29 mmol/L   Calcium 9.4 8.7 - 10.3 mg/dL   Total Protein 6.9 6.0 - 8.5 g/dL   Albumin 4.4 3.8 - 4.8 g/dL   Globulin, Total 2.5 1.5 - 4.5 g/dL   Albumin/Globulin Ratio 1.8 1.2 -  2.2  Bilirubin Total 0.2 0.0 - 1.2 mg/dL   Alkaline Phosphatase 65 44 - 121 IU/L   AST 19 0 - 40 IU/L   ALT 11 0 - 32 IU/L  Lipid Panel w/o Chol/HDL Ratio  Result Value Ref Range   Cholesterol, Total 154 100 - 199 mg/dL   Triglycerides 133 0 - 149 mg/dL   HDL 60 >39 mg/dL   VLDL Cholesterol Cal 23 5 - 40 mg/dL   LDL Chol Calc (NIH) 71 0 - 99 mg/dL  Urinalysis, Routine w reflex microscopic  Result Value Ref Range   Specific Gravity, UA <1.005 (L) 1.005 - 1.030   pH, UA 6.0 5.0 - 7.5   Color, UA Yellow Yellow   Appearance Ur Clear Clear   Leukocytes,UA Negative Negative   Protein,UA Negative Negative/Trace   Glucose, UA Negative Negative   Ketones, UA Negative Negative   RBC, UA Negative Negative   Bilirubin, UA Negative Negative   Urobilinogen, Ur 0.2 0.2 - 1.0 mg/dL   Nitrite, UA Negative Negative  TSH  Result Value Ref Range   TSH 2.690 0.450 - 4.500 uIU/mL  Microalbumin, Urine Waived  Result Value Ref Range   Microalb, Ur Waived 10 0 - 19 mg/L   Creatinine, Urine Waived 10 10 - 300 mg/dL   Microalb/Creat Ratio <30 <30 mg/g      Assessment & Plan:   Problem List Items Addressed This Visit       Cardiovascular and Mediastinum   HTN (hypertension) - Primary    Doing well at home. Continue to monitor. Call with any concerns. Continue to monitor. Refills given today.      Relevant Medications   losartan (COZAAR) 25 MG tablet   Other Relevant Orders   Basic metabolic panel   Atherosclerosis of aorta (HCC)    Will keep BP and cholesterol under good control. Continue to monitor. Call with any concerns.       Relevant Medications   losartan (COZAAR) 25 MG tablet     Other   Depression, recurrent ([place])    Under good control on current regimen. Continue current regimen. Continue to monitor. Call with any concerns. Refills given.        Relevant Medications   escitalopram (LEXAPRO) 20 MG tablet   LORazepam (ATIVAN) 1 MG tablet   Anxiety    Under good control  on current regimen. Continue current regimen. Continue to monitor. Call with any concerns. Refills given. 30 lorazepam should last about 6 months.       Relevant Medications   escitalopram (LEXAPRO) 20 MG tablet   LORazepam (ATIVAN) 1 MG tablet   Other Visit Diagnoses     Need for influenza vaccination       Relevant Orders   Flu Vaccine QUAD 6+ mos PF IM (Fluarix Quad PF) (Completed)        Follow up plan: Return in about 6 months (around 11/30/2021), or physical.

## 2021-06-02 NOTE — Assessment & Plan Note (Signed)
Doing well at home. Continue to monitor. Call with any concerns. Continue to monitor. Refills given today.

## 2021-06-02 NOTE — Assessment & Plan Note (Signed)
Under good control on current regimen. Continue current regimen. Continue to monitor. Call with any concerns. Refills given.   

## 2021-06-02 NOTE — Assessment & Plan Note (Signed)
Will keep BP and cholesterol under good control. Continue to monitor. Call with any concerns.  

## 2021-06-03 ENCOUNTER — Telehealth: Payer: Self-pay

## 2021-06-03 LAB — BASIC METABOLIC PANEL
BUN/Creatinine Ratio: 17 (ref 12–28)
BUN: 13 mg/dL (ref 8–27)
CO2: 25 mmol/L (ref 20–29)
Calcium: 8.9 mg/dL (ref 8.7–10.3)
Chloride: 104 mmol/L (ref 96–106)
Creatinine, Ser: 0.75 mg/dL (ref 0.57–1.00)
Glucose: 90 mg/dL (ref 70–99)
Potassium: 4.5 mmol/L (ref 3.5–5.2)
Sodium: 139 mmol/L (ref 134–144)
eGFR: 89 mL/min/{1.73_m2} (ref >59)

## 2021-06-03 NOTE — Telephone Encounter (Signed)
Placed completed handicap form in complete bin for patient pick up. Patient is aware.

## 2021-06-08 ENCOUNTER — Encounter: Payer: Self-pay | Admitting: Family Medicine

## 2021-06-09 ENCOUNTER — Encounter: Payer: Self-pay | Admitting: Family Medicine

## 2021-06-09 ENCOUNTER — Telehealth (INDEPENDENT_AMBULATORY_CARE_PROVIDER_SITE_OTHER): Payer: No Typology Code available for payment source | Admitting: Family Medicine

## 2021-06-09 VITALS — BP 125/76 | HR 79

## 2021-06-09 DIAGNOSIS — U071 COVID-19: Secondary | ICD-10-CM

## 2021-06-09 MED ORDER — PREDNISONE 50 MG PO TABS: 50.0000 mg | ORAL_TABLET | Freq: Every day | ORAL | 0 refills | Status: DC

## 2021-06-09 MED ORDER — MOLNUPIRAVIR EUA 200MG CAPSULE: 4.0000 | ORAL_CAPSULE | Freq: Two times a day (BID) | ORAL | 0 refills | Status: AC

## 2021-06-09 NOTE — Progress Notes (Signed)
BP 125/76   Pulse 79    Subjective:    Patient ID: Mary Hardin, female    DOB: 12/20/1955, 65 y.o.   MRN: [id number]  HPI: Mary Hardin is a 65 y.o. female  Chief Complaint  Patient presents with   Covid Positive    Tested covid positive yesterday morning. Symptoms, head congestion, chest congestion, and cough   UPPER RESPIRATORY TRACT INFECTION Duration: 2 days Worst symptom: congestion Fever: yes Cough: no Shortness of breath: no Wheezing: no Chest pain: no Chest tightness: yes Chest congestion: yes Nasal congestion: yes Runny nose: yes Post nasal drip: yes Sneezing: no Sore throat: yes Swollen glands: no Sinus pressure: no Headache: yes Face pain: yes Toothache: no Ear pain: no  Ear pressure: yes bilateral Eyes red/itching:no Eye drainage/crusting: no  Vomiting: no Rash: no Fatigue: yes Sick contacts: yes Strep contacts: no  Context: stable Recurrent sinusitis: no Relief with OTC cold/cough medications: no  Treatments attempted: cold/sinus   Relevant past medical, surgical, family and social history reviewed and updated as indicated. Interim medical history since our last visit reviewed. Allergies and medications reviewed and updated.  Review of Systems  Constitutional:  Positive for chills, diaphoresis and fatigue. Negative for appetite change, fever and unexpected weight change.  HENT:  Positive for congestion, postnasal drip, rhinorrhea, sinus pressure and sinus pain. Negative for dental problem, drooling, ear discharge, ear pain, facial swelling, hearing loss, mouth sores, nosebleeds, sneezing, sore throat, tinnitus, trouble swallowing and voice change.   Eyes: Negative.   Respiratory: Negative.    Cardiovascular: Negative.   Gastrointestinal: Negative.   Psychiatric/Behavioral: Negative.     Per HPI unless specifically indicated above     Objective:    BP 125/76   Pulse 79   Wt Readings from Last 3 Encounters:  06/02/21 (!) 367  lb 6.4 oz (166.7 kg)  12/03/20 (!) 361 lb 3.2 oz (163.8 kg)  06/24/20 (!) 346 lb 3.2 oz (157 kg)    Physical Exam Vitals and nursing note reviewed.  Constitutional:      General: She is not in acute distress.    Appearance: Normal appearance. She is not ill-appearing, toxic-appearing or diaphoretic.  HENT:     Head: Normocephalic and atraumatic.     Right Ear: External ear normal.     Left Ear: External ear normal.     Nose: Nose normal.     Mouth/Throat:     Mouth: Mucous membranes are moist.     Pharynx: Oropharynx is clear.  Eyes:     General: No scleral icterus.       Right eye: No discharge.        Left eye: No discharge.     Conjunctiva/sclera: Conjunctivae normal.     Pupils: Pupils are equal, round, and reactive to light.  Pulmonary:     Effort: Pulmonary effort is normal. No respiratory distress.     Comments: Speaking in full sentences Musculoskeletal:        General: Normal range of motion.     Cervical back: Normal range of motion.  Skin:    Coloration: Skin is not jaundiced or pale.     Findings: No bruising, erythema, lesion or rash.  Neurological:     Mental Status: She is alert and oriented to person, place, and time. Mental status is at baseline.  Psychiatric:        Mood and Affect: Mood normal.        Behavior: Behavior normal.  Thought Content: Thought content normal.        Judgment: Judgment normal.    Results for orders placed or performed in visit on 63/84/66  Basic metabolic panel  Result Value Ref Range   Glucose 90 70 - 99 mg/dL   BUN 13 8 - 27 mg/dL   Creatinine, Ser 0.75 0.57 - 1.00 mg/dL   eGFR 89 >59 mL/min/1.73   BUN/Creatinine Ratio 17 12 - 28   Sodium 139 134 - 144 mmol/L   Potassium 4.5 3.5 - 5.2 mmol/L   Chloride 104 96 - 106 mmol/L   CO2 25 20 - 29 mmol/L   Calcium 8.9 8.7 - 10.3 mg/dL      Assessment & Plan:   Problem List Items Addressed This Visit   None Visit Diagnoses     COVID-19    -  Primary   Will treat  with molnopirovir and prednisone. Call if not getting better or getting worse. Continue to mointor.    Relevant Medications   molnupiravir EUA (LAGEVRIO) 200 mg CAPS capsule        Follow up plan: Return if symptoms worsen or fail to improve.    This visit was completed via video visit through MyChart due to the restrictions of the COVID-19 pandemic. All issues as above were discussed and addressed. Physical exam was done as above through visual confirmation on video through MyChart. If it was felt that the patient should be evaluated in the office, they were directed there. The patient verbally consented to this visit. Location of the patient: home Location of the provider: work Those involved with this call:  Provider: Park Liter, DO CMA: Frazier Butt, CMA Front Desk/Registration: [employer]  Time spent on call:  15 minutes with patient face to face via video conference. More than 50% of this time was spent in counseling and coordination of care. 23 minutes total spent in review of patient's record and preparation of their chart.

## 2021-06-09 NOTE — Telephone Encounter (Signed)
Virtual appt scheduled for today at 11:00am.

## 2021-06-09 NOTE — Telephone Encounter (Signed)
OK to double book me for a virtual appt

## 2021-06-19 ENCOUNTER — Encounter: Payer: Self-pay | Admitting: Family Medicine

## 2021-06-20 NOTE — Telephone Encounter (Signed)
Virtual appt 

## 2021-06-21 ENCOUNTER — Telehealth (INDEPENDENT_AMBULATORY_CARE_PROVIDER_SITE_OTHER): Payer: No Typology Code available for payment source | Admitting: Family Medicine

## 2021-06-21 ENCOUNTER — Encounter: Payer: Self-pay | Admitting: Family Medicine

## 2021-06-21 VITALS — BP 131/76

## 2021-06-21 DIAGNOSIS — J0100 Acute maxillary sinusitis, unspecified: Secondary | ICD-10-CM | POA: Diagnosis not present

## 2021-06-21 DIAGNOSIS — G9332 Myalgic encephalomyelitis/chronic fatigue syndrome: Secondary | ICD-10-CM | POA: Diagnosis not present

## 2021-06-21 DIAGNOSIS — U099 Post covid-19 condition, unspecified: Secondary | ICD-10-CM | POA: Diagnosis not present

## 2021-06-21 MED ORDER — AMOXICILLIN-POT CLAVULANATE 875-125 MG PO TABS: 1.0000 | ORAL_TABLET | Freq: Two times a day (BID) | ORAL | 0 refills | Status: DC

## 2021-06-21 NOTE — Telephone Encounter (Signed)
Appt scheduled

## 2021-06-21 NOTE — Progress Notes (Signed)
BP 131/76    Subjective:    Patient ID: Mary Hardin, female    DOB: 01-08-1956, 65 y.o.   MRN: [id number]  HPI: Mary Hardin is a 64 y.o. female  Chief Complaint  Patient presents with   Nasal Congestion    Patient states she is still having congestion. Patient was positive for COVID about 2 weeks ago    UPPER RESPIRATORY TRACT INFECTION Duration: 2-3 weeks Worst symptom: congestion. Brain fog Fever: no Cough: yes Shortness of breath: no Wheezing: no Chest pain: no Chest tightness: no Chest congestion: no Nasal congestion: yes Runny nose: yes Post nasal drip: yes Sneezing: no Sore throat: yes Swollen glands: no Sinus pressure: yes Headache: yes Face pain: yes Toothache: no Ear pain: no  Ear pressure: no  Eyes red/itching:no Eye drainage/crusting: no  Vomiting: no Rash: no Fatigue: yes Sick contacts: yes Strep contacts: no  Context: stable Recurrent sinusitis: no Relief with OTC cold/cough medications: no  Treatments attempted: cold/sinus    Relevant past medical, surgical, family and social history reviewed and updated as indicated. Interim medical history since our last visit reviewed. Allergies and medications reviewed and updated.  Review of Systems  Constitutional:  Positive for fatigue. Negative for activity change, appetite change, chills, diaphoresis, fever and unexpected weight change.  HENT:  Positive for congestion, postnasal drip, rhinorrhea, sinus pressure, sinus pain, sneezing and sore throat. Negative for dental problem, drooling, ear discharge, ear pain, facial swelling, hearing loss, mouth sores, nosebleeds, tinnitus, trouble swallowing and voice change.   Respiratory:  Positive for cough. Negative for apnea, choking, chest tightness, shortness of breath, wheezing and stridor.   Cardiovascular: Negative.   Neurological: Negative.   Psychiatric/Behavioral: Negative.     Per HPI unless specifically indicated above     Objective:     BP 131/76   Wt Readings from Last 3 Encounters:  06/02/21 (!) 367 lb 6.4 oz (166.7 kg)  12/03/20 (!) 361 lb 3.2 oz (163.8 kg)  06/24/20 (!) 346 lb 3.2 oz (157 kg)    Physical Exam Vitals and nursing note reviewed.  Constitutional:      General: She is not in acute distress.    Appearance: Normal appearance. She is obese. She is not ill-appearing, toxic-appearing or diaphoretic.  HENT:     Head: Normocephalic and atraumatic.     Right Ear: External ear normal.     Left Ear: External ear normal.     Nose: Nose normal.     Mouth/Throat:     Mouth: Mucous membranes are moist.     Pharynx: Oropharynx is clear.  Eyes:     General: No scleral icterus.       Right eye: No discharge.        Left eye: No discharge.     Conjunctiva/sclera: Conjunctivae normal.     Pupils: Pupils are equal, round, and reactive to light.  Pulmonary:     Effort: Pulmonary effort is normal. No respiratory distress.     Comments: Speaking in full sentences Musculoskeletal:        General: Normal range of motion.     Cervical back: Normal range of motion.  Skin:    Coloration: Skin is not jaundiced or pale.     Findings: No bruising, erythema, lesion or rash.  Neurological:     Mental Status: She is alert and oriented to person, place, and time. Mental status is at baseline.  Psychiatric:        Mood  and Affect: Mood normal.        Behavior: Behavior normal.        Thought Content: Thought content normal.        Judgment: Judgment normal.    Results for orders placed or performed in visit on 63/14/97  Basic metabolic panel  Result Value Ref Range   Glucose 90 70 - 99 mg/dL   BUN 13 8 - 27 mg/dL   Creatinine, Ser 0.75 0.57 - 1.00 mg/dL   eGFR 89 >59 mL/min/1.73   BUN/Creatinine Ratio 17 12 - 28   Sodium 139 134 - 144 mmol/L   Potassium 4.5 3.5 - 5.2 mmol/L   Chloride 104 96 - 106 mmol/L   CO2 25 20 - 29 mmol/L   Calcium 8.9 8.7 - 10.3 mg/dL      Assessment & Plan:   Problem List Items  Addressed This Visit   None Visit Diagnoses     Acute non-recurrent maxillary sinusitis    -  Primary   Will treat with augmentin. Call with any concerns. Continue to monitor.    Relevant Medications   amoxicillin-clavulanate (AUGMENTIN) 875-125 MG tablet   Post-COVID chronic fatigue       Discussed that this is common for 3-6 weeks after COVID. Continue to monitor. Rest as able. Call with any concerns.         Follow up plan: Return if symptoms worsen or fail to improve.    This visit was completed via video visit through MyChart due to the restrictions of the COVID-19 pandemic. All issues as above were discussed and addressed. Physical exam was done as above through visual confirmation on video through MyChart. If it was felt that the patient should be evaluated in the office, they were directed there. The patient verbally consented to this visit. Location of the patient: home Location of the provider: work Those involved with this call:  Provider: Park Liter, DO CMA: Louanna Raw, [place] Desk/Registration: [employer]  Time spent on call:  15 minutes with patient face to face via video conference. More than 50% of this time was spent in counseling and coordination of care. 23 minutes total spent in review of patient's record and preparation of their chart.

## 2021-11-30 ENCOUNTER — Encounter: Payer: Self-pay | Admitting: Family Medicine

## 2021-11-30 ENCOUNTER — Telehealth (INDEPENDENT_AMBULATORY_CARE_PROVIDER_SITE_OTHER): Payer: No Typology Code available for payment source | Admitting: Family Medicine

## 2021-11-30 VITALS — BP 136/76 | HR 93

## 2021-11-30 DIAGNOSIS — F419 Anxiety disorder, unspecified: Secondary | ICD-10-CM

## 2021-11-30 DIAGNOSIS — F339 Major depressive disorder, recurrent, unspecified: Secondary | ICD-10-CM | POA: Diagnosis not present

## 2021-11-30 DIAGNOSIS — I10 Essential (primary) hypertension: Secondary | ICD-10-CM | POA: Diagnosis not present

## 2021-11-30 MED ORDER — LOSARTAN POTASSIUM 25 MG PO TABS: 25.0000 mg | ORAL_TABLET | Freq: Every day | ORAL | 1 refills | Status: DC

## 2021-11-30 MED ORDER — ESCITALOPRAM OXALATE 20 MG PO TABS: 20.0000 mg | ORAL_TABLET | Freq: Every day | ORAL | 1 refills | Status: DC

## 2021-11-30 MED ORDER — LORAZEPAM 1 MG PO TABS: 1.0000 mg | ORAL_TABLET | Freq: Every day | ORAL | 0 refills | Status: DC | PRN

## 2021-11-30 MED ORDER — NAPROXEN 500 MG PO TABS: 500.0000 mg | ORAL_TABLET | Freq: Two times a day (BID) | ORAL | 1 refills | Status: DC

## 2021-11-30 NOTE — Assessment & Plan Note (Signed)
Under good control on current regimen. Continue current regimen. Continue to monitor. Call with any concerns. Refills given.   

## 2021-11-30 NOTE — Progress Notes (Signed)
? ?BP 136/76   Pulse 93   ? ?Subjective:  ? ? Patient ID: Mary Hardin, female    DOB: [birth year], 66 y.o.   MRN: [id number] ? ?HPI: ?Mary Hardin is a 66 y.o. female ? ?Chief Complaint  ?Patient presents with  ? Hypertension  ? Depression  ? Anxiety  ? ?ANXIETY/DEPRESSION ?Duration: chronic ?Status:controlled ?Anxious mood: yes  ?Excessive worrying: no ?Irritability: no  ?Sweating: no ?Nausea: no ?Palpitations:no ?Hyperventilation: no ?Panic attacks: no ?Agoraphobia: no  ?Obscessions/compulsions: no ?Depressed mood: no ? ?  11/30/2021  ? 10:50 AM 06/09/2021  ? 10:48 AM 06/02/2021  ? 10:20 AM 12/03/2020  ?  2:58 PM 05/28/2020  ?  8:37 AM  ?Depression screen PHQ 2/9  ?Decreased Interest 0 0 0 0 0  ?Down, Depressed, Hopeless 0 0 0 0 0  ?PHQ - 2 Score 0 0 0 0 0  ?Altered sleeping 1 0 0 1 0  ?Tired, decreased energy 1 0 0 0 0  ?Change in appetite 0 0 1 0 0  ?Feeling bad or failure about yourself  0 0 0 0 0  ?Trouble concentrating 0 0 0 0 0  ?Moving slowly or fidgety/restless 0 0 0 0 0  ?Suicidal thoughts 0 0 0 0 0  ?PHQ-9 Score 2 0 1 1 0  ?Difficult doing work/chores Not difficult at all Not difficult at all Not difficult at all Not difficult at all Not difficult at all  ? ? ?  11/30/2021  ? 10:51 AM 06/09/2021  ? 10:48 AM 06/02/2021  ? 10:20 AM 05/28/2020  ?  8:37 AM  ?GAD 7 : Generalized Anxiety Score  ?Nervous, Anxious, on Edge 1 0 0 0  ?Control/stop worrying 0 0 0 0  ?Worry too much - different things 0 0 0 0  ?Trouble relaxing 0 0 0 0  ?Restless 0 0 0 0  ?Easily annoyed or irritable 1 0 0 0  ?Afraid - awful might happen 0 0 0 0  ?Total GAD 7 Score 2 0 0 0  ?Anxiety Difficulty Not difficult at all Not difficult at all  Not difficult at all  ? ? ? ?Anhedonia: no ?Weight changes: no ?Insomnia: no   ?Hypersomnia: no ?Fatigue/loss of energy: no ?Feelings of worthlessness: no ?Feelings of guilt: no ?Impaired concentration/indecisiveness: no ?Suicidal ideations: no  ?Crying spells: no ?Recent Stressors/Life Changes:  no ?  Relationship problems: no ?  Family stress: no   ?  Financial stress: no  ?  Job stress: no  ?  Recent death/loss: no ? ?HYPERTENSION ?Hypertension status: controlled  ?Satisfied with current treatment? yes ?Duration of hypertension: chronic ?BP monitoring frequency:  a few times a month ?BP medication side effects:  no ?Medication compliance: excellent compliance ?Previous BP meds:losartan ?Aspirin: no ?Recurrent headaches: no ?Visual changes: no ?Palpitations: no ?Dyspnea: no ?Chest pain: no ?Lower extremity edema: no ?Dizzy/lightheaded: no ? ?Relevant past medical, surgical, family and social history reviewed and updated as indicated. Interim medical history since our last visit reviewed. ?Allergies and medications reviewed and updated. ? ?Review of Systems  ?Constitutional: Negative.   ?Respiratory: Negative.    ?Cardiovascular: Negative.   ?Gastrointestinal: Negative.   ?Musculoskeletal: Negative.   ?Psychiatric/Behavioral: Negative.    ? ?Per HPI unless specifically indicated above ? ?   ?Objective:  ?  ?BP 136/76   Pulse 93   ?Wt Readings from Last 3 Encounters:  ?06/02/21 (!) 367 lb 6.4 oz (166.7 kg)  ?12/03/20 (!) 361 lb 3.2 oz (  163.8 kg)  ?06/24/20 (!) 346 lb 3.2 oz (157 kg)  ?  ?Physical Exam ?Vitals and nursing note reviewed.  ?Constitutional:   ?   General: She is not in acute distress. ?   Appearance: Normal appearance. She is not ill-appearing, toxic-appearing or diaphoretic.  ?HENT:  ?   Head: Normocephalic and atraumatic.  ?   Right Ear: External ear normal.  ?   Left Ear: External ear normal.  ?   Nose: Nose normal.  ?   Mouth/Throat:  ?   Mouth: Mucous membranes are moist.  ?   Pharynx: Oropharynx is clear.  ?Eyes:  ?   General: No scleral icterus.    ?   Right eye: No discharge.     ?   Left eye: No discharge.  ?   Conjunctiva/sclera: Conjunctivae normal.  ?   Pupils: Pupils are equal, round, and reactive to light.  ?Pulmonary:  ?   Effort: Pulmonary effort is normal. No respiratory  distress.  ?   Comments: Speaking in full sentences ?Musculoskeletal:     ?   General: Normal range of motion.  ?   Cervical back: Normal range of motion.  ?Skin: ?   Coloration: Skin is not jaundiced or pale.  ?   Findings: No bruising, erythema, lesion or rash.  ?Neurological:  ?   Mental Status: She is alert and oriented to person, place, and time. Mental status is at baseline.  ?Psychiatric:     ?   Mood and Affect: Mood normal.     ?   Behavior: Behavior normal.     ?   Thought Content: Thought content normal.     ?   Judgment: Judgment normal.  ? ? ?Results for orders placed or performed in visit on 07/19/21  ?HM PAP SMEAR  ?Result Value Ref Range  ? HM Pap smear see result scanned into chart   ? ?   ?Assessment & Plan:  ? ?Problem List Items Addressed This Visit   ? ?  ? Cardiovascular and Mediastinum  ? HTN (hypertension) - Primary  ?  Under good control on current regimen. Continue current regimen. Continue to monitor. Call with any concerns. Refills given. Labs to be drawn shortly. ? ? ?  ?  ? Relevant Medications  ? losartan (COZAAR) 25 MG tablet  ? Other Relevant Orders  ? Basic metabolic panel  ?  ? Other  ? Depression, recurrent (HCC)  ?  Under good control on current regimen. Continue current regimen. Continue to monitor. Call with any concerns. Refills given.  ? ? ?  ?  ? Relevant Medications  ? LORazepam (ATIVAN) 1 MG tablet  ? escitalopram (LEXAPRO) 20 MG tablet  ? Anxiety  ?  Under good control on current regimen. Continue current regimen. Continue to monitor. Call with any concerns. Refills given.  ? ? ?  ?  ? Relevant Medications  ? LORazepam (ATIVAN) 1 MG tablet  ? escitalopram (LEXAPRO) 20 MG tablet  ?  ? ?Follow up plan: ?Return in about 6 months (around 06/02/2022), or physical. ? ? ?This visit was completed via video visit through MyChart due to the restrictions of the COVID-19 pandemic. All issues as above were discussed and addressed. Physical exam was done as above through visual  confirmation on video through MyChart. If it was felt that the patient should be evaluated in the office, they were directed there. The patient verbally consented to this visit. ?Location of the patient: home ?  Location of the provider: work ?Those involved with this call:  ?Provider: Olevia Perches, DO ?CMA: Rolley Sims, CMA ?Front Desk/Registration: [employer]  ?Time spent on call:  25 minutes with patient face to face via video conference. More than 50% of this time was spent in counseling and coordination of care. 40 minutes total spent in review of patient's record and preparation of their chart. ? ? ? ? ?

## 2021-11-30 NOTE — Assessment & Plan Note (Signed)
Under good control on current regimen. Continue current regimen. Continue to monitor. Call with any concerns. Refills given. Labs to be drawn shortly.  

## 2021-12-12 ENCOUNTER — Telehealth: Payer: Self-pay | Admitting: Family Medicine

## 2021-12-12 NOTE — Telephone Encounter (Signed)
Faxed pending lab order to [facility] in [place], patient states she is not able to get in contact with them. Patient is coming to town on June 9, scheduled lab visit for that day.

## 2021-12-12 NOTE — Telephone Encounter (Signed)
Patient called about her labs. She is in [place] and couldn't find a [facility]. She was needing to know if she could come in on June 9th when she has an appointment to get labs done then? I didn't see where she had an appointment with our office that day. She would like a call back please.

## 2021-12-30 ENCOUNTER — Other Ambulatory Visit: Payer: No Typology Code available for payment source

## 2022-01-02 ENCOUNTER — Other Ambulatory Visit: Payer: No Typology Code available for payment source

## 2022-01-02 ENCOUNTER — Other Ambulatory Visit: Payer: Self-pay

## 2022-01-02 DIAGNOSIS — I10 Essential (primary) hypertension: Secondary | ICD-10-CM

## 2022-01-03 LAB — BASIC METABOLIC PANEL
BUN/Creatinine Ratio: 23 (ref 12–28)
BUN: 17 mg/dL (ref 8–27)
CO2: 23 mmol/L (ref 20–29)
Calcium: 9 mg/dL (ref 8.7–10.3)
Chloride: 103 mmol/L (ref 96–106)
Creatinine, Ser: 0.74 mg/dL (ref 0.57–1.00)
Glucose: 88 mg/dL (ref 70–99)
Potassium: 4.5 mmol/L (ref 3.5–5.2)
Sodium: 138 mmol/L (ref 134–144)
eGFR: 90 mL/min/{1.73_m2} (ref >59)

## 2022-01-12 ENCOUNTER — Other Ambulatory Visit: Payer: Self-pay | Admitting: Obstetrics and Gynecology

## 2022-01-12 DIAGNOSIS — R928 Other abnormal and inconclusive findings on diagnostic imaging of breast: Secondary | ICD-10-CM

## 2022-02-14 ENCOUNTER — Ambulatory Visit: Payer: PRIVATE HEALTH INSURANCE

## 2022-02-14 ENCOUNTER — Ambulatory Visit
Admission: RE | Admit: 2022-02-14 | Discharge: 2022-02-14 | Disposition: A | Discharge status: Home / Self Care | Payer: PRIVATE HEALTH INSURANCE | Source: Ambulatory Visit | Attending: Obstetrics and Gynecology | Admitting: Obstetrics and Gynecology

## 2022-02-14 DIAGNOSIS — R928 Other abnormal and inconclusive findings on diagnostic imaging of breast: Secondary | ICD-10-CM

## 2022-04-11 ENCOUNTER — Encounter: Payer: Self-pay | Admitting: Family Medicine

## 2022-04-28 ENCOUNTER — Ambulatory Visit
Admission: RE | Admit: 2022-04-28 | Discharge: 2022-04-28 | Disposition: A | Discharge status: Home / Self Care | Payer: Medicare Other | Attending: Family Medicine | Admitting: Family Medicine

## 2022-04-28 ENCOUNTER — Encounter: Payer: Self-pay | Admitting: Family Medicine

## 2022-04-28 ENCOUNTER — Ambulatory Visit
Admission: RE | Admit: 2022-04-28 | Discharge: 2022-04-28 | Disposition: A | Discharge status: Home / Self Care | Payer: Medicare Other | Source: Ambulatory Visit | Attending: Family Medicine | Admitting: Family Medicine

## 2022-04-28 ENCOUNTER — Ambulatory Visit (INDEPENDENT_AMBULATORY_CARE_PROVIDER_SITE_OTHER): Payer: Medicare Other | Admitting: Family Medicine

## 2022-04-28 VITALS — BP 119/80 | HR 79 | Temp 98.4°F | Wt 367.8 lb

## 2022-04-28 DIAGNOSIS — Z23 Encounter for immunization: Secondary | ICD-10-CM | POA: Diagnosis not present

## 2022-04-28 DIAGNOSIS — M1990 Unspecified osteoarthritis, unspecified site: Secondary | ICD-10-CM | POA: Insufficient documentation

## 2022-04-28 MED ORDER — MELOXICAM 15 MG PO TABS: 15.0000 mg | ORAL_TABLET | Freq: Every day | ORAL | 1 refills | Status: DC

## 2022-04-28 NOTE — Progress Notes (Signed)
BP 119/80   Pulse 79   Temp 98.4 F (36.9 C)   Wt (!) 367 lb 12.8 oz (166.8 kg)   SpO2 98%   BMI 56.76 kg/m    Subjective:    Patient ID: Mary Hardin, female    DOB: [birth year], 66 y.o.   MRN: [id number]  HPI: Mary Hardin is a 66 y.o. female  Chief Complaint  Patient presents with   Pain    Patient states she would like to know if there is other medications better than naproxen for her pain. Patient takes naproxen for pain and does not help. Patient states she has pain under her right breast that comes and goes.    Has been having soreness under her R breast. She notes that she has been taking her omeprazole and it has not been helping at all. She notes that the pain is there most of the time. She also notes that she has pain in her R side going into her back which is stiff and sore. She also notes that she has pain in her L knee and foot. She notes that she is not able to stand for a long time due to pain. She notes that she has been getting shots in her knees every 3 months or so. This last time she got a shot it only helped for about a month. She has not done any of the viscous injections in her knees. She is flat footed and she has been seeing podiatry and they have been having her do PT. They are avoiding doing surgery on her. She has been using a brace. She has numerous joint pains and has been having issues with her balance. She is otherwise doing well with no other concerns at this time.   Relevant past medical, surgical, family and social history reviewed and updated as indicated. Interim medical history since our last visit reviewed. Allergies and medications reviewed and updated.  Review of Systems  Constitutional: Negative.   Respiratory: Negative.    Cardiovascular: Negative.   Gastrointestinal:  Positive for abdominal pain. Negative for abdominal distention, anal bleeding, blood in stool, constipation, diarrhea, nausea, rectal pain and vomiting.   Musculoskeletal:  Positive for back pain and myalgias. Negative for arthralgias, gait problem, joint swelling, neck pain and neck stiffness.  Neurological: Negative.   Psychiatric/Behavioral: Negative.      Per HPI unless specifically indicated above     Objective:    BP 119/80   Pulse 79   Temp 98.4 F (36.9 C)   Wt (!) 367 lb 12.8 oz (166.8 kg)   SpO2 98%   BMI 56.76 kg/m   Wt Readings from Last 3 Encounters:  04/28/22 (!) 367 lb 12.8 oz (166.8 kg)  06/02/21 (!) 367 lb 6.4 oz (166.7 kg)  12/03/20 (!) 361 lb 3.2 oz (163.8 kg)    Physical Exam Vitals and nursing note reviewed.  Constitutional:      General: She is not in acute distress.    Appearance: Normal appearance. She is obese. She is not ill-appearing, toxic-appearing or diaphoretic.  HENT:     Head: Normocephalic and atraumatic.     Right Ear: External ear normal.     Left Ear: External ear normal.     Nose: Nose normal.     Mouth/Throat:     Mouth: Mucous membranes are moist.     Pharynx: Oropharynx is clear.  Eyes:     General: No scleral icterus.  Right eye: No discharge.        Left eye: No discharge.     Extraocular Movements: Extraocular movements intact.     Conjunctiva/sclera: Conjunctivae normal.     Pupils: Pupils are equal, round, and reactive to light.  Cardiovascular:     Rate and Rhythm: Normal rate and regular rhythm.     Pulses: Normal pulses.     Heart sounds: Normal heart sounds. No murmur heard.    No friction rub. No gallop.  Pulmonary:     Effort: Pulmonary effort is normal. No respiratory distress.     Breath sounds: Normal breath sounds. No stridor. No wheezing, rhonchi or rales.  Chest:     Chest wall: No tenderness.  Musculoskeletal:        General: Tenderness present. No swelling, deformity or signs of injury. Normal range of motion.     Cervical back: Normal range of motion and neck supple.     Right lower leg: No edema.     Left lower leg: No edema.  Skin:     General: Skin is warm and dry.     Capillary Refill: Capillary refill takes less than 2 seconds.     Coloration: Skin is not jaundiced or pale.     Findings: No bruising, erythema, lesion or rash.  Neurological:     General: No focal deficit present.     Mental Status: She is alert and oriented to person, place, and time. Mental status is at baseline.  Psychiatric:        Mood and Affect: Mood normal.        Behavior: Behavior normal.        Thought Content: Thought content normal.        Judgment: Judgment normal.     Results for orders placed or performed in visit on 21/97/58  Basic metabolic panel  Result Value Ref Range   Glucose 88 70 - 99 mg/dL   BUN 17 8 - 27 mg/dL   Creatinine, Ser 0.74 0.57 - 1.00 mg/dL   eGFR 90 >59 mL/min/1.73   BUN/Creatinine Ratio 23 12 - 28   Sodium 138 134 - 144 mmol/L   Potassium 4.5 3.5 - 5.2 mmol/L   Chloride 103 96 - 106 mmol/L   CO2 23 20 - 29 mmol/L   Calcium 9.0 8.7 - 10.3 mg/dL      Assessment & Plan:   Problem List Items Addressed This Visit   None Visit Diagnoses     Osteoarthritis, unspecified osteoarthritis type, unspecified site    -  Primary   Will change from naproxen to meloxicam and recheck by mychart in 2 weeks. May need to add lyrica. Will check x-rays. Await results.    Relevant Medications   meloxicam (MOBIC) 15 MG tablet   Other Relevant Orders   DG Lumbar Spine Complete   DG Ribs Unilateral Right   DG Thoracic Spine W/Swimmers   Need for influenza vaccination       Relevant Orders   Flu Vaccine QUAD 6+ mos PF IM (Fluarix Quad PF)        Follow up plan: Return as scheduled.

## 2022-05-04 ENCOUNTER — Encounter: Payer: Self-pay | Admitting: Family Medicine

## 2022-05-04 DIAGNOSIS — M1990 Unspecified osteoarthritis, unspecified site: Secondary | ICD-10-CM

## 2022-05-08 NOTE — Telephone Encounter (Signed)
Please see below about location for PT. Thanks

## 2022-05-12 NOTE — Telephone Encounter (Signed)
Should be all set now [place], sorry for that

## 2022-05-22 ENCOUNTER — Encounter: Payer: Self-pay | Admitting: Family Medicine

## 2022-05-22 MED ORDER — PREGABALIN 75 MG PO CAPS: ORAL_CAPSULE | ORAL | 1 refills | Status: DC

## 2022-05-22 NOTE — Telephone Encounter (Signed)
Med sent to pharmacy please follow up virtually in 1 month

## 2022-05-23 NOTE — Telephone Encounter (Signed)
Pt scheduled  

## 2022-06-07 ENCOUNTER — Other Ambulatory Visit: Payer: Self-pay | Admitting: Family Medicine

## 2022-06-07 DIAGNOSIS — F339 Major depressive disorder, recurrent, unspecified: Secondary | ICD-10-CM

## 2022-06-07 NOTE — Telephone Encounter (Signed)
Requested Prescriptions  Pending Prescriptions Disp Refills   escitalopram (LEXAPRO) 20 MG tablet [Pharmacy Med Name: Escitalopram Oxalate 20 MG Oral Tablet] 90 tablet 0    Sig: TAKE 1 TABLET BY MOUTH DAILY     Psychiatry:  Antidepressants - SSRI Passed - 06/07/2022  8:50 AM      Passed - Completed PHQ-2 or PHQ-9 in the last 360 days      Passed - Valid encounter within last 6 months    Recent Outpatient Visits           1 month ago Osteoarthritis, unspecified osteoarthritis type, unspecified site   [facility], [place], DO   6 months ago Primary hypertension   [facility] [place], [place], DO   11 months ago Acute non-recurrent maxillary sinusitis   [facility] [place], [place], DO   12 months ago COVID-19   [employer], [place], DO   1 year ago Primary hypertension   [facility] [place], [place], DO       Future Appointments             In 2 weeks Johnson, Megan P, DO [place], [facility]             naproxen (NAPROSYN) 500 MG tablet [Pharmacy Med Name: Naproxen 500 MG Oral Tablet] 180 tablet 3    Sig: TAKE 1 TABLET BY MOUTH TWICE  DAILY WITH A MEAL     Analgesics:  NSAIDS Failed - 06/07/2022  8:50 AM      Failed - Manual Review: Labs are only required if the patient has taken medication for more than 8 weeks.      Failed - HGB in normal range and within 360 days    Hemoglobin  Date Value Ref Range Status  12/03/2020 12.0 11.1 - 15.9 g/dL Final         Failed - PLT in normal range and within 360 days    Platelets  Date Value Ref Range Status  12/03/2020 229 150 - 450 x10E3/uL Final         Failed - HCT in normal range and within 360 days    Hematocrit  Date Value Ref Range Status  12/03/2020 36.5 34.0 - 46.6 % Final         Passed - Cr in normal range and within 360 days    Creatinine, Ser  Date Value Ref Range Status  01/02/2022 0.74 0.57 - 1.00 mg/dL Final          Passed - eGFR is 30 or above and within 360 days    GFR calc Af Amer  Date Value Ref Range Status  06/24/2020 105 >59 mL/min/1.73 Final    Comment:    **In accordance with recommendations from the NKF-ASN Task force,**   [facility] is in the process of updating its eGFR calculation to the   2021 CKD-EPI creatinine equation that estimates kidney function   without a race variable.    GFR calc non Af Amer  Date Value Ref Range Status  06/24/2020 91 >59 mL/min/1.73 Final   eGFR  Date Value Ref Range Status  01/02/2022 90 >59 mL/min/1.73 Final         Passed - Patient is not pregnant      Passed - Valid encounter within last 12 months    Recent Outpatient Visits           1 month ago Osteoarthritis, unspecified  osteoarthritis type, unspecified site   [facility], [place], DO   6 months ago Primary hypertension   [facility] [place], [place], DO   11 months ago Acute non-recurrent maxillary sinusitis   [facility] [place], [place], DO   12 months ago COVID-19   [employer], [place], DO   1 year ago Primary hypertension   [facility] [place], [place], DO       Future Appointments             In 2 weeks Johnson, Megan P, DO [place], [facility]             losartan (COZAAR) 25 MG tablet [employer] Med Name: Losartan Potassium 25 MG Oral Tablet] 90 tablet 0    Sig: TAKE 1 TABLET BY MOUTH DAILY     Cardiovascular:  Angiotensin Receptor Blockers Passed - 06/07/2022  8:50 AM      Passed - Cr in normal range and within 180 days    Creatinine, Ser  Date Value Ref Range Status  01/02/2022 0.74 0.57 - 1.00 mg/dL Final         Passed - K in normal range and within 180 days    Potassium  Date Value Ref Range Status  01/02/2022 4.5 3.5 - 5.2 mmol/L Final         Passed - Patient is not pregnant      Passed - Last BP in normal range    BP Readings from Last 1 Encounters:   04/28/22 119/80         Passed - Valid encounter within last 6 months    Recent Outpatient Visits           1 month ago Osteoarthritis, unspecified osteoarthritis type, unspecified site   [facility], [place], DO   6 months ago Primary hypertension   [place], [place], DO   11 months ago Acute non-recurrent maxillary sinusitis   [facility] [place], [place], DO   12 months ago COVID-19   [employer], [place], DO   1 year ago Primary hypertension   [place], [place], DO       Future Appointments             In 2 weeks Wynetta Emery, Barb Merino, DO [employer], [facility]

## 2022-06-07 NOTE — Telephone Encounter (Signed)
Unable to refill per protocol, Rx expired. Medication was discontinued 04/28/22. Will refuse.  Requested Prescriptions  Pending Prescriptions Disp Refills   naproxen (NAPROSYN) 500 MG tablet [Pharmacy Med Name: Naproxen 500 MG Oral Tablet] 180 tablet 3    Sig: TAKE 1 TABLET BY MOUTH TWICE  DAILY WITH A MEAL     Analgesics:  NSAIDS Failed - 06/07/2022  8:50 AM      Failed - Manual Review: Labs are only required if the patient has taken medication for more than 8 weeks.      Failed - HGB in normal range and within 360 days    Hemoglobin  Date Value Ref Range Status  12/03/2020 12.0 11.1 - 15.9 g/dL Final         Failed - PLT in normal range and within 360 days    Platelets  Date Value Ref Range Status  12/03/2020 229 150 - 450 x10E3/uL Final         Failed - HCT in normal range and within 360 days    Hematocrit  Date Value Ref Range Status  12/03/2020 36.5 34.0 - 46.6 % Final         Passed - Cr in normal range and within 360 days    Creatinine, Ser  Date Value Ref Range Status  01/02/2022 0.74 0.57 - 1.00 mg/dL Final         Passed - eGFR is 30 or above and within 360 days    GFR calc Af Amer  Date Value Ref Range Status  06/24/2020 105 >59 mL/min/1.73 Final    Comment:    **In accordance with recommendations from the NKF-ASN Task force,**   [facility] is in the process of updating its eGFR calculation to the   2021 CKD-EPI creatinine equation that estimates kidney function   without a race variable.    GFR calc non Af Amer  Date Value Ref Range Status  06/24/2020 91 >59 mL/min/1.73 Final   eGFR  Date Value Ref Range Status  01/02/2022 90 >59 mL/min/1.73 Final         Passed - Patient is not pregnant      Passed - Valid encounter within last 12 months    Recent Outpatient Visits           1 month ago Osteoarthritis, unspecified osteoarthritis type, unspecified site   [facility], [place], DO   6 months ago Primary hypertension   [place], [place], DO   11 months ago Acute non-recurrent maxillary sinusitis   [facility] [place], [place], DO   12 months ago COVID-19   [employer], [place], DO   1 year ago Primary hypertension   [facility] [place], [place], DO       Future Appointments             In 2 weeks Johnson, Megan P, DO [facility], [facility]            Signed Prescriptions Disp Refills   escitalopram (LEXAPRO) 20 MG tablet 90 tablet 0    Sig: TAKE 1 TABLET BY MOUTH DAILY     Psychiatry:  Antidepressants - SSRI Passed - 06/07/2022  8:50 AM      Passed - Completed PHQ-2 or PHQ-9 in the last 360 days      Passed - Valid encounter within last 6 months    Recent Outpatient Visits  1 month ago Osteoarthritis, unspecified osteoarthritis type, unspecified site   [facility], [place], DO   6 months ago Primary hypertension   [facility] [place], [place], DO   11 months ago Acute non-recurrent maxillary sinusitis   [facility] [place], [place], DO   12 months ago COVID-19   [employer], [place], DO   1 year ago Primary hypertension   [facility] [place], Megan P, DO       Future Appointments             In 2 weeks Johnson, Megan P, DO [facility], [facility]             losartan (COZAAR) 25 MG tablet 90 tablet 0    Sig: TAKE 1 TABLET BY MOUTH DAILY     Cardiovascular:  Angiotensin Receptor Blockers Passed - 06/07/2022  8:50 AM      Passed - Cr in normal range and within 180 days    Creatinine, Ser  Date Value Ref Range Status  01/02/2022 0.74 0.57 - 1.00 mg/dL Final         Passed - K in normal range and within 180 days    Potassium  Date Value Ref Range Status  01/02/2022 4.5 3.5 - 5.2 mmol/L Final         Passed - Patient is not pregnant      Passed - Last BP in normal range    BP Readings from Last 1  Encounters:  04/28/22 119/80         Passed - Valid encounter within last 6 months    Recent Outpatient Visits           1 month ago Osteoarthritis, unspecified osteoarthritis type, unspecified site   [facility], [place], DO   6 months ago Primary hypertension   [place], [place], DO   11 months ago Acute non-recurrent maxillary sinusitis   [facility] [place], [place], DO   12 months ago COVID-19   [employer], [place], DO   1 year ago Primary hypertension   [place], [place], DO       Future Appointments             In 2 weeks Wynetta Emery, Barb Merino, DO [employer], [facility]

## 2022-06-22 ENCOUNTER — Telehealth: Payer: Medicare Other | Admitting: Family Medicine

## 2022-06-28 ENCOUNTER — Telehealth (INDEPENDENT_AMBULATORY_CARE_PROVIDER_SITE_OTHER): Payer: Medicare Other | Admitting: Family Medicine

## 2022-06-28 ENCOUNTER — Encounter: Payer: Self-pay | Admitting: Family Medicine

## 2022-06-28 ENCOUNTER — Telehealth: Payer: Self-pay | Admitting: Family Medicine

## 2022-06-28 DIAGNOSIS — M1990 Unspecified osteoarthritis, unspecified site: Secondary | ICD-10-CM

## 2022-06-28 MED ORDER — PREGABALIN 75 MG PO CAPS: 75.0000 mg | ORAL_CAPSULE | Freq: Three times a day (TID) | ORAL | 2 refills | Status: DC

## 2022-06-28 NOTE — Telephone Encounter (Signed)
Spoke with [facility] and provider would have to sign orders and will re-faxed signed orders and notes to [facility].

## 2022-06-28 NOTE — Telephone Encounter (Signed)
Please send note to PT and call to give them verbal orders for reevaluation and continued treatment. If they need a new referral, please let me know

## 2022-06-28 NOTE — Progress Notes (Signed)
There were no vitals taken for this visit.   Subjective:    Patient ID: Mary Hardin, female    DOB: 1956-07-22, 66 y.o.   MRN: [id number]  HPI: Mary Hardin is a 66 y.o. female  Chief Complaint  Patient presents with   Back Pain   BACK PAIN Duration:  chronic Mechanism of injury: unknown Location: R low back Onset: gradual Severity: moderate Quality: sore and stiff Frequency: intermittent Radiation: none Aggravating factors: lifting and movement Alleviating factors: lyrica, PT Status: better Treatments attempted:  lyrica, rest, ice, heat, APAP, ibuprofen, aleve, physical therapy, and HEP  Relief with NSAIDs?: mild Nighttime pain:  no Paresthesias / decreased sensation:  no Bowel / bladder incontinence:  no Fevers:  no Dysuria / urinary frequency:  no   Relevant past medical, surgical, family and social history reviewed and updated as indicated. Interim medical history since our last visit reviewed. Allergies and medications reviewed and updated.  Review of Systems  Constitutional: Negative.   Respiratory: Negative.    Cardiovascular: Negative.   Gastrointestinal: Negative.   Musculoskeletal:  Positive for back pain and myalgias. Negative for arthralgias, gait problem, joint swelling, neck pain and neck stiffness.  Skin: Negative.   Neurological: Negative.   Psychiatric/Behavioral: Negative.      Per HPI unless specifically indicated above     Objective:    There were no vitals taken for this visit.  Wt Readings from Last 3 Encounters:  04/28/22 (!) 367 lb 12.8 oz (166.8 kg)  06/02/21 (!) 367 lb 6.4 oz (166.7 kg)  12/03/20 (!) 361 lb 3.2 oz (163.8 kg)    Physical Exam Vitals and nursing note reviewed.  Constitutional:      General: She is not in acute distress.    Appearance: Normal appearance. She is obese. She is not ill-appearing, toxic-appearing or diaphoretic.  HENT:     Head: Normocephalic and atraumatic.     Right Ear: External ear  normal.     Left Ear: External ear normal.     Nose: Nose normal.     Mouth/Throat:     Mouth: Mucous membranes are moist.     Pharynx: Oropharynx is clear.  Eyes:     General: No scleral icterus.       Right eye: No discharge.        Left eye: No discharge.     Conjunctiva/sclera: Conjunctivae normal.     Pupils: Pupils are equal, round, and reactive to light.  Pulmonary:     Effort: Pulmonary effort is normal. No respiratory distress.     Comments: Speaking in full sentences Musculoskeletal:        General: Normal range of motion.     Cervical back: Normal range of motion.  Skin:    Coloration: Skin is not jaundiced or pale.     Findings: No bruising, erythema, lesion or rash.  Neurological:     Mental Status: She is alert and oriented to person, place, and time. Mental status is at baseline.  Psychiatric:        Mood and Affect: Mood normal.        Behavior: Behavior normal.        Thought Content: Thought content normal.        Judgment: Judgment normal.     Results for orders placed or performed in visit on 07/68/08  Basic metabolic panel  Result Value Ref Range   Glucose 88 70 - 99 mg/dL   BUN 17  8 - 27 mg/dL   Creatinine, Ser 0.74 0.57 - 1.00 mg/dL   eGFR 90 >59 mL/min/1.73   BUN/Creatinine Ratio 23 12 - 28   Sodium 138 134 - 144 mmol/L   Potassium 4.5 3.5 - 5.2 mmol/L   Chloride 103 96 - 106 mmol/L   CO2 23 20 - 29 mmol/L   Calcium 9.0 8.7 - 10.3 mg/dL      Assessment & Plan:   Problem List Items Addressed This Visit       Musculoskeletal and Integument   Osteoarthritis - Primary    Significantly better on the lyrica. Tolerating it well. Will increase her to 64m TID and recheck 10 days at welcome to medicare. Call with any concerns. Doing better with PT- will continue it. Will send note to her PT.         Follow up plan: Return Dec 15 1:20- Welcome to [employer]   This visit was completed via video visit through MyChart due to the restrictions of  the COVID-19 pandemic. All issues as above were discussed and addressed. Physical exam was done as above through visual confirmation on video through MyChart. If it was felt that the patient should be evaluated in the office, they were directed there. The patient verbally consented to this visit. Location of the patient: parking lot Location of the provider: work Those involved with this call:  Provider: MPark Liter DO CMA: DIrena Reichmann [place]/Registration: [employer] Time spent on call:  15 minutes with patient face to face via video conference. More than 50% of this time was spent in counseling and coordination of care. 23 minutes total spent in review of patient's record and preparation of their chart.

## 2022-06-28 NOTE — Assessment & Plan Note (Signed)
Significantly better on the lyrica. Tolerating it well. Will increase her to 75mg  TID and recheck 10 days at welcome to medicare. Call with any concerns. Doing better with PT- will continue it. Will send note to her PT.

## 2022-07-07 ENCOUNTER — Encounter: Payer: Self-pay | Admitting: Family Medicine

## 2022-07-07 ENCOUNTER — Ambulatory Visit (INDEPENDENT_AMBULATORY_CARE_PROVIDER_SITE_OTHER): Payer: Medicare Other | Admitting: Family Medicine

## 2022-07-07 ENCOUNTER — Ambulatory Visit: Payer: Medicare Other | Admitting: Family Medicine

## 2022-07-07 VITALS — BP 130/88 | HR 60 | Temp 98.3°F | Ht 68.0 in | Wt 379.0 lb

## 2022-07-07 DIAGNOSIS — Z0000 Encounter for general adult medical examination without abnormal findings: Secondary | ICD-10-CM | POA: Diagnosis not present

## 2022-07-07 DIAGNOSIS — Z1231 Encounter for screening mammogram for malignant neoplasm of breast: Secondary | ICD-10-CM

## 2022-07-07 DIAGNOSIS — D692 Other nonthrombocytopenic purpura: Secondary | ICD-10-CM | POA: Diagnosis not present

## 2022-07-07 DIAGNOSIS — Z23 Encounter for immunization: Secondary | ICD-10-CM | POA: Diagnosis not present

## 2022-07-07 DIAGNOSIS — I700 Atherosclerosis of aorta: Secondary | ICD-10-CM

## 2022-07-07 DIAGNOSIS — Z6841 Body Mass Index (BMI) 40.0 and over, adult: Secondary | ICD-10-CM

## 2022-07-07 DIAGNOSIS — F339 Major depressive disorder, recurrent, unspecified: Secondary | ICD-10-CM | POA: Diagnosis not present

## 2022-07-07 DIAGNOSIS — Z780 Asymptomatic menopausal state: Secondary | ICD-10-CM

## 2022-07-07 DIAGNOSIS — I10 Essential (primary) hypertension: Secondary | ICD-10-CM

## 2022-07-07 DIAGNOSIS — Z136 Encounter for screening for cardiovascular disorders: Secondary | ICD-10-CM

## 2022-07-07 DIAGNOSIS — Z7189 Other specified counseling: Secondary | ICD-10-CM | POA: Insufficient documentation

## 2022-07-07 LAB — URINALYSIS, ROUTINE W REFLEX MICROSCOPIC
Bilirubin, UA: NEGATIVE
Glucose, UA: NEGATIVE
Ketones, UA: NEGATIVE
Leukocytes,UA: NEGATIVE
Nitrite, UA: NEGATIVE
Protein,UA: NEGATIVE
RBC, UA: NEGATIVE
Specific Gravity, UA: 1.015 (ref 1.005–1.030)
Urobilinogen, Ur: 0.2 mg/dL (ref 0.2–1.0)
pH, UA: 7 (ref 5.0–7.5)

## 2022-07-07 LAB — MICROALBUMIN, URINE WAIVED
Creatinine, Urine Waived: 10 mg/dL (ref 10–300)
Microalb, Ur Waived: 10 mg/L (ref 0–19)

## 2022-07-07 MED ORDER — MELOXICAM 15 MG PO TABS: 15.0000 mg | ORAL_TABLET | Freq: Every day | ORAL | 1 refills | Status: DC

## 2022-07-07 MED ORDER — LOSARTAN POTASSIUM 25 MG PO TABS: 25.0000 mg | ORAL_TABLET | Freq: Every day | ORAL | 1 refills | Status: DC

## 2022-07-07 MED ORDER — ESCITALOPRAM OXALATE 20 MG PO TABS: 20.0000 mg | ORAL_TABLET | Freq: Every day | ORAL | 1 refills | Status: DC

## 2022-07-07 MED ORDER — PREGABALIN 75 MG PO CAPS: 75.0000 mg | ORAL_CAPSULE | Freq: Three times a day (TID) | ORAL | 1 refills | Status: DC

## 2022-07-07 NOTE — Assessment & Plan Note (Signed)
Encouraged diet and exercise with goal of losing 1-2lbs per week. Labs drawn today. 

## 2022-07-07 NOTE — Assessment & Plan Note (Signed)
A voluntary discussion about advance care planning including the explanation and discussion of advance directives was extensively discussed  with the patient for 3 minutes with patient present.  Explanation about the health care proxy and Living will was reviewed and packet with forms with explanation of how to fill them out was given.  During this discussion, the patient was able to identify a health care proxy as Baird Lyons and plans to fill out the paperwork required.  Patient was offered a separate Advance Care Planning visit for further assistance with forms.

## 2022-07-07 NOTE — Patient Instructions (Addendum)
Please call to schedule your mammogram and bone density (you are due for your mammo in June): [facility]  Address: [address], [place], [place] [address] Phone: [phone]  [facility] [address] [place],  [place]  [address] Phone: [phone]    Preventative Services:  AAA screening: N/A Health Risk Assessment and Personalized Prevention Plan: Done today Bone Mass Measurements: Ordered today Breast Cancer Screening: Due in June- ordered today CVD Screening: Done today Cervical Cancer Screening: Done at GYN Colon Cancer Screening: Up to date Depression Screening: Done today Diabetes Screening: Done today Glaucoma Screening: See your eye doctor Hepatitis B vaccine: N/A Hepatitis C screening: up to date HIV Screening: up to date Flu Vaccine: up to date Lung cancer Screening: N/A Obesity Screening: Done today Pneumonia Vaccines: Given today STI Screening: N/A

## 2022-07-07 NOTE — Assessment & Plan Note (Signed)
Reassured patient. Continue to monitor.  

## 2022-07-07 NOTE — Progress Notes (Signed)
BP 130/88   Pulse 60   Temp 98.3 F (36.8 C) (Oral)   Ht _0  (1.727 m)   Wt (!) 379 lb (171.9 kg)   SpO2 98%   BMI 57.63 kg/m    Subjective:    Patient ID: Mary Hardin, female    DOB: 1956-04-01, 66 y.o.   MRN: [id number]  HPI: Mary Hardin is a 66 y.o. female presenting on 07/07/2022 for comprehensive medical examination. Current medical complaints include:  HYPERTENSION  Hypertension status: controlled  Satisfied with current treatment? yes Duration of hypertension: chronic BP monitoring frequency:  not checking BP medication side effects:  no Medication compliance: excellent compliance Previous BP meds: losartan Aspirin: no Recurrent headaches: no Visual changes: no Palpitations: no Dyspnea: no Chest pain: no Lower extremity edema: no Dizzy/lightheaded: no  DEPRESSION Mood status: controlled Satisfied with current treatment?: no Symptom severity: mild  Duration of current treatment : chronic Side effects: no Medication compliance: excellent compliance Psychotherapy/counseling: no  Previous psychiatric medications: lexapro Depressed mood: no Anxious mood: no Anhedonia: no Significant weight loss or gain: no Insomnia: no  Fatigue: no Feelings of worthlessness or guilt: no Impaired concentration/indecisiveness: no Suicidal ideations: no Hopelessness: no Crying spells: no    07/07/2022    1:56 PM 04/28/2022    2:23 PM 11/30/2021   10:50 AM 06/09/2021   10:48 AM 06/02/2021   10:20 AM  Depression screen PHQ 2/9  Decreased Interest 0 0 0 0 0  Down, Depressed, Hopeless 0 0 0 0 0  PHQ - 2 Score 0 0 0 0 0  Altered sleeping 0 0 1 0 0  Tired, decreased energy 0 3 1 0 0  Change in appetite 3 3 0 0 1  Feeling bad or failure about yourself  0 0 0 0 0  Trouble concentrating 0 0 0 0 0  Moving slowly or fidgety/restless 0 0 0 0 0  Suicidal thoughts 0 0 0 0 0  PHQ-9 Score _1 0 1  Difficult doing work/chores Not difficult at all Somewhat  difficult Not difficult at all Not difficult at all Not difficult at all   Menopausal Symptoms: no  Functional Status Survey: Is the patient deaf or have difficulty hearing?: No Does the patient have difficulty seeing, even when wearing glasses/contacts?: No Does the patient have difficulty concentrating, remembering, or making decisions?: No Does the patient have difficulty walking or climbing stairs?: Yes Does the patient have difficulty dressing or bathing?: No Does the patient have difficulty doing errands alone such as visiting a doctor's office or shopping?: Yes     07/07/2022    1:48 PM 04/28/2022    2:24 PM 06/09/2021   10:48 AM 06/02/2021   10:06 AM 12/03/2020    2:58 PM  Fall Risk   Falls in the past year? 1 1 0 0 0  Number falls in past yr: 0 0 0 0 0  Injury with Fall? 0 1 0 0 0  Risk for fall due to : History of fall(s) History of fall(s);Impaired balance/gait No Fall Risks No Fall Risks No Fall Risks  Follow up _2     Depression Screen    07/07/2022    1:56 PM 04/28/2022    2:23 PM 11/30/2021   10:50 AM 06/09/2021   10:48 AM 06/02/2021   10:20 AM  Depression screen PHQ 2/9  Decreased Interest 0 0 0 0  0  Down, Depressed, Hopeless 0 0 0 0 0  PHQ - 2 Score 0 0 0 0 0  Altered sleeping 0 0 1 0 0  Tired, decreased energy 0 3 1 0 0  Change in appetite 3 3 0 0 1  Feeling bad or failure about yourself  0 0 0 0 0  Trouble concentrating 0 0 0 0 0  Moving slowly or fidgety/restless 0 0 0 0 0  Suicidal thoughts 0 0 0 0 0  PHQ-9 Score _0 0 1  Difficult doing work/chores Not difficult at all Somewhat difficult Not difficult at all Not difficult at all Not difficult at all    Advanced Directives Does patient have a HCPOA?    yes Does patient have a living will or MOST form?  yes  Past Medical History:  Past Medical History:  Diagnosis  Date   Anemia    Anxiety    Arthritis    Asthma    Depression    Family history of adverse reaction to anesthesia    Pt mother has PONV   Family history of malignant neoplasm of breast    Fatty liver    GERD (gastroesophageal reflux disease)    Headache    Hernia of abdominal wall    Hypertension    PONV (postoperative nausea and vomiting)    Sleep apnea    wears cpap    Surgical History:  Past Surgical History:  Procedure Laterality Date   CHOLECYSTECTOMY     COLONOSCOPY N/A 08/31/2017   Procedure: COLONOSCOPY;  Surgeon: Wonda Horner, MD;  Location: [facility] ENDOSCOPY;  Service: Endoscopy;  Laterality: N/A;   COLONOSCOPY W/ BIOPSIES AND POLYPECTOMY     DILATION AND CURETTAGE OF UTERUS     x2   ESOPHAGOGASTRODUODENOSCOPY N/A 08/31/2017   Procedure: ESOPHAGOGASTRODUODENOSCOPY (EGD);  Surgeon: Wonda Horner, MD;  Location: [facility] ENDOSCOPY;  Service: Endoscopy;  Laterality: N/A;   EYE SURGERY     lasik   VARICOSE VEIN SURGERY     wisdom teeth extraction      Medications:  Current Outpatient Medications on File Prior to Visit  Medication Sig   Cholecalciferol (VITAMIN D3) 5000 units CAPS Take 5,000 Units by mouth daily.   docusate sodium (COLACE) 100 MG capsule Take 100 mg by mouth daily.   esomeprazole (NEXIUM) 40 MG capsule TAKE 1 CAPSULE BY MOUTH EVERY DAY PRN   LORazepam (ATIVAN) 1 MG tablet Take 1 tablet (1 mg total) by mouth daily as needed for anxiety.   mupirocin ointment (BACTROBAN) 2 % APPLY TO AFFECTED AREAS 3 TIMES DAILY   valACYclovir (VALTREX) 1000 MG tablet Take 1 tablet every 12 hours by oral route as needed.   No current facility-administered medications on file prior to visit.    Allergies:  Allergies  Allergen Reactions   Shellfish Allergy     swelling   Shellfish-Derived Products     swelling    Social History:  Social History   Socioeconomic History   Marital status: Widowed    Spouse name: Not on file   Number of children: 0   Years of  education: Not on file   Highest education level: Not on file  Occupational History   Not on file  Tobacco Use   Smoking status: Never   Smokeless tobacco: Never  Vaping Use   Vaping Use: Never used  Substance and Sexual Activity   Alcohol use: Yes    Comment: 1-4 glasses of wine  per week   Drug use: No   Sexual activity: Not Currently  Other Topics Concern   Not on file  Social History Narrative   Not on file   Social Determinants of Health   Financial Resource Strain: Not on file  Food Insecurity: Not on file  Transportation Needs: Not on file  Physical Activity: Not on file  Stress: Not on file  Social Connections: Not on file  Intimate Partner Violence: Not on file   Social History   Tobacco Use  Smoking Status Never  Smokeless Tobacco Never   Social History   Substance and Sexual Activity  Alcohol Use Yes   Comment: 1-4 glasses of wine per week    Family History:  Family History  Problem Relation Age of Onset   Breast cancer Maternal Grandmother 72       deceased 58   Diabetes Maternal Grandmother    Pancreatic cancer Paternal Grandmother 49   Breast cancer Maternal Aunt 83       currently 77   Thyroid cancer Cousin        dx in her 76s   Breast cancer Mother 96       DCIS; currently 64   Hypertension Mother    Arrhythmia Mother        A-fib   Stroke Father    Hyperlipidemia Father    Seizures Father    Cancer Brother     Past medical history, surgical history, medications, allergies, family history and social history reviewed with patient today and changes made to appropriate areas of the chart.   Review of Systems  Constitutional: Negative.   HENT: Negative.    Eyes: Negative.   Respiratory:  Positive for cough and wheezing. Negative for hemoptysis, sputum production and shortness of breath.   Cardiovascular:  Positive for palpitations and leg swelling. Negative for chest pain, orthopnea, claudication and PND.  Gastrointestinal: Negative.    Genitourinary:  Positive for frequency. Negative for dysuria, flank pain, hematuria and urgency.  Musculoskeletal:  Positive for back pain and myalgias. Negative for falls, joint pain and neck pain.  Skin: Negative.   Neurological:  Positive for dizziness. Negative for tingling, tremors, sensory change, speech change, focal weakness, seizures, loss of consciousness, weakness and headaches.  Endo/Heme/Allergies:  Positive for environmental allergies. Negative for polydipsia. Bruises/bleeds easily.  Psychiatric/Behavioral: Negative.      All other ROS negative except what is listed above and in the HPI.      Objective:    BP 130/88   Pulse 60   Temp 98.3 F (36.8 C) (Oral)   Ht _0  (1.727 m)   Wt (!) 379 lb (171.9 kg)   SpO2 98%   BMI 57.63 kg/m   Wt Readings from Last 3 Encounters:  07/07/22 (!) 379 lb (171.9 kg)  04/28/22 (!) 367 lb 12.8 oz (166.8 kg)  06/02/21 (!) 367 lb 6.4 oz (166.7 kg)    Vision Screening   Right eye Left eye Both eyes  Without correction     With correction _1    Physical Exam Vitals and nursing note reviewed.  Constitutional:      General: She is not in acute distress.    Appearance: Normal appearance. She is obese. She is not ill-appearing, toxic-appearing or diaphoretic.  HENT:     Head: Normocephalic and atraumatic.     Right Ear: Tympanic membrane, ear canal and external ear normal. There is no impacted cerumen.  Left Ear: Tympanic membrane, ear canal and external ear normal. There is no impacted cerumen.     Nose: Nose normal. No congestion or rhinorrhea.     Mouth/Throat:     Mouth: Mucous membranes are moist.     Pharynx: Oropharynx is clear. No oropharyngeal exudate or posterior oropharyngeal erythema.  Eyes:     General: No scleral icterus.       Right eye: No discharge.        Left eye: No discharge.     Extraocular Movements: Extraocular movements intact.     Conjunctiva/sclera: Conjunctivae normal.      Pupils: Pupils are equal, round, and reactive to light.  Neck:     Vascular: No carotid bruit.  Cardiovascular:     Rate and Rhythm: Normal rate and regular rhythm.     Pulses: Normal pulses.     Heart sounds: No murmur heard.    No friction rub. No gallop.  Pulmonary:     Effort: Pulmonary effort is normal. No respiratory distress.     Breath sounds: Normal breath sounds. No stridor. No wheezing, rhonchi or rales.  Chest:     Chest wall: No tenderness.  Abdominal:     General: Abdomen is flat. Bowel sounds are normal. There is no distension.     Palpations: Abdomen is soft. There is no mass.     Tenderness: There is no abdominal tenderness. There is no right CVA tenderness, left CVA tenderness, guarding or rebound.     Hernia: No hernia is present.  Genitourinary:    Comments: Breast and pelvic exams deferred with shared decision making Musculoskeletal:        General: No swelling, tenderness, deformity or signs of injury. Normal range of motion.     Cervical back: Normal range of motion and neck supple. No rigidity. No muscular tenderness.     Right lower leg: No edema.     Left lower leg: No edema.  Lymphadenopathy:     Cervical: No cervical adenopathy.  Skin:    General: Skin is warm and dry.     Capillary Refill: Capillary refill takes less than 2 seconds.     Coloration: Skin is not jaundiced or pale.     Findings: No bruising, erythema, lesion or rash.  Neurological:     General: No focal deficit present.     Mental Status: She is alert and oriented to person, place, and time. Mental status is at baseline.     Cranial Nerves: No cranial nerve deficit.     Sensory: No sensory deficit.     Motor: No weakness.     Coordination: Coordination normal.     Gait: Gait normal.     Deep Tendon Reflexes: Reflexes normal.  Psychiatric:        Mood and Affect: Mood normal.        Behavior: Behavior normal.        Thought Content: Thought content normal.        Judgment:  Judgment normal.        07/07/2022    1:57 PM 07/07/2022    1:56 PM  6CIT Screen  What Year? 0 points 0 points  What month? 0 points 0 points  What time? 0 points 0 points  Count back from 20 0 points 0 points  Months in reverse 0 points 0 points  Repeat phrase 4 points 4 points  Total Score 4 points 4 points    Results for orders  placed or performed in visit on 41/66/06  Basic metabolic panel  Result Value Ref Range   Glucose 88 70 - 99 mg/dL   BUN 17 8 - 27 mg/dL   Creatinine, Ser 0.74 0.57 - 1.00 mg/dL   eGFR 90 >59 mL/min/1.73   BUN/Creatinine Ratio 23 12 - 28   Sodium 138 134 - 144 mmol/L   Potassium 4.5 3.5 - 5.2 mmol/L   Chloride 103 96 - 106 mmol/L   CO2 23 20 - 29 mmol/L   Calcium 9.0 8.7 - 10.3 mg/dL      Assessment & Plan:   Problem List Items Addressed This Visit       Cardiovascular and Mediastinum   HTN (hypertension)    Under good control on current regimen. Continue current regimen. Continue to monitor. Call with any concerns. Refills given. Labs drawn today.       Relevant Medications   losartan (COZAAR) 25 MG tablet   Other Relevant Orders   Microalbumin, Urine Waived   CBC with Differential/Platelet   Comprehensive metabolic panel   Urinalysis, Routine w reflex microscopic   TSH   Atherosclerosis of aorta (HCC)    Will keep BP and cholesterol under good control. Continue to monitor.      Relevant Medications   losartan (COZAAR) 25 MG tablet   Other Relevant Orders   Comprehensive metabolic panel   Lipid Panel w/o Chol/HDL Ratio   Senile purpura ([place])    Reassured patient. Continue to monitor.      Relevant Medications   losartan (COZAAR) 25 MG tablet     Other   Depression, recurrent ([place])    Under good control on current regimen. Continue current regimen. Continue to monitor. Call with any concerns. Refills given.        Relevant Medications   escitalopram (LEXAPRO) 20 MG tablet   BMI 50.0-59.9, adult (HCC)    Encouraged  diet and exercise with goal of losing 1-2lbs per week. Labs drawn today.      Advance directive discussed with patient    A voluntary discussion about advance care planning including the explanation and discussion of advance directives was extensively discussed  with the patient for 3 minutes with patient present.  Explanation about the health care proxy and Living will was reviewed and packet with forms with explanation of how to fill them out was given.  During this discussion, the patient was able to identify a health care proxy as Myriam Jacobson and plans to fill out the paperwork required.  Patient was offered a separate [place] visit for further assistance with forms.         Morbid obesity ([place])    Encouraged diet and exercise with goal of losing 1-2lbs per week. Labs drawn today.      Other Visit Diagnoses     Welcome to Medicare preventive visit    -  Primary   Preventative care discussed today as below. Vaccines updated. Screening labs checked today. Pap through GYN.   Encounter for screening mammogram for malignant neoplasm of breast       Mammo due in June- ordered today.   Relevant Orders   MM 3D SCREEN BREAST BILATERAL   Screening for cardiovascular condition       EKG done today.   Relevant Orders   EKG 12-Lead (Completed)   Postmenopausal estrogen deficiency       DEXA ordered today.   Relevant Orders   DG Bone Density   Need  for vaccination for pneumococcus       Prevnar given today   Relevant Orders   Pneumococcal conjugate vaccine 20-valent (Prevnar 20)        Preventative Services:  AAA screening: N/A Health Risk Assessment and Personalized Prevention Plan: Done today Bone Mass Measurements: Ordered today Breast Cancer Screening: Due in June- ordered today CVD Screening: Done today Cervical Cancer Screening: Done at GYN Colon Cancer Screening: Up to date Depression Screening: Done today Diabetes Screening: Done today Glaucoma Screening: See your  eye doctor Hepatitis B vaccine: N/A Hepatitis C screening: up to date HIV Screening: up to date Flu Vaccine: up to date Lung cancer Screening: N/A Obesity Screening: Done today Pneumonia Vaccines: Given today STI Screening: N/A  Follow up plan: Return in about 6 months (around 01/06/2023) for pap release.   LABORATORY TESTING:  - Pap smear: done elsewhere  IMMUNIZATIONS:   - Tdap: Tetanus vaccination status reviewed: last tetanus booster within 10 years. - Influenza: Up to date - Prevnar: Administered today - Zostavax vaccine: Up to date  SCREENING: -Mammogram: Ordered today  - Colonoscopy: Up to date  - Bone Density: Ordered today  -Hearing Test: Ordered today   PATIENT COUNSELING:   Advised to take 1 mg of folate supplement per day if capable of pregnancy.   Sexuality: Discussed sexually transmitted diseases, partner selection, use of condoms, avoidance of unintended pregnancy  and contraceptive alternatives.   Advised to avoid cigarette smoking.  I discussed with the patient that most people either abstain from alcohol or drink within safe limits (<=14/week and <=4 drinks/occasion for males, <=7/weeks and <= 3 drinks/occasion for females) and that the risk for alcohol disorders and other health effects rises proportionally with the number of drinks per week and how often a drinker exceeds daily limits.  Discussed cessation/primary prevention of drug use and availability of treatment for abuse.   Diet: Encouraged to adjust caloric intake to maintain  or achieve ideal body weight, to reduce intake of dietary saturated fat and total fat, to limit sodium intake by avoiding high sodium foods and not adding table salt, and to maintain adequate dietary potassium and calcium preferably from fresh fruits, vegetables, and low-fat dairy products.    stressed the importance of regular exercise  Injury prevention: Discussed safety belts, safety helmets, smoke detector, smoking near  bedding or upholstery.   Dental health: Discussed importance of regular tooth brushing, flossing, and dental visits.    NEXT PREVENTATIVE PHYSICAL DUE IN 1 YEAR. Return in about 6 months (around 01/06/2023) for pap release.

## 2022-07-07 NOTE — Assessment & Plan Note (Signed)
Will keep BP and cholesterol under good control. Continue to monitor.  

## 2022-07-07 NOTE — Assessment & Plan Note (Signed)
Under good control on current regimen. Continue current regimen. Continue to monitor. Call with any concerns. Refills given. Labs drawn today.   

## 2022-07-07 NOTE — Assessment & Plan Note (Signed)
Under good control on current regimen. Continue current regimen. Continue to monitor. Call with any concerns. Refills given.   

## 2022-07-08 LAB — LIPID PANEL W/O CHOL/HDL RATIO
Cholesterol, Total: 155 mg/dL (ref 100–199)
HDL: 70 mg/dL (ref >39)
LDL Chol Calc (NIH): 67 mg/dL (ref 0–99)
Triglycerides: 97 mg/dL (ref 0–149)
VLDL Cholesterol Cal: 18 mg/dL (ref 5–40)

## 2022-07-08 LAB — CBC WITH DIFFERENTIAL/PLATELET
Basophils Absolute: 0 10*3/uL (ref 0.0–0.2)
Basos: 1 %
EOS (ABSOLUTE): 0.2 10*3/uL (ref 0.0–0.4)
Eos: 4 %
Hematocrit: 34.2 % (ref 34.0–46.6)
Hemoglobin: 11.2 g/dL (ref 11.1–15.9)
Immature Grans (Abs): 0 10*3/uL (ref 0.0–0.1)
Immature Granulocytes: 0 %
Lymphocytes Absolute: 1.6 10*3/uL (ref 0.7–3.1)
Lymphs: 27 %
MCH: 27.8 pg (ref 26.6–33.0)
MCHC: 32.7 g/dL (ref 31.5–35.7)
MCV: 85 fL (ref 79–97)
Monocytes Absolute: 0.3 10*3/uL (ref 0.1–0.9)
Monocytes: 6 %
Neutrophils Absolute: 3.8 10*3/uL (ref 1.4–7.0)
Neutrophils: 62 %
Platelets: 252 10*3/uL (ref 150–450)
RBC: 4.03 x10E6/uL (ref 3.77–5.28)
RDW: 13.3 % (ref 11.7–15.4)
WBC: 6 10*3/uL (ref 3.4–10.8)

## 2022-07-08 LAB — COMPREHENSIVE METABOLIC PANEL
ALT: 14 IU/L (ref 0–32)
AST: 19 IU/L (ref 0–40)
Albumin/Globulin Ratio: 1.5 (ref 1.2–2.2)
Albumin: 4 g/dL (ref 3.9–4.9)
Alkaline Phosphatase: 80 IU/L (ref 44–121)
BUN/Creatinine Ratio: 15 (ref 12–28)
BUN: 13 mg/dL (ref 8–27)
Bilirubin Total: 0.3 mg/dL (ref 0.0–1.2)
CO2: 21 mmol/L (ref 20–29)
Calcium: 9.3 mg/dL (ref 8.7–10.3)
Chloride: 103 mmol/L (ref 96–106)
Creatinine, Ser: 0.88 mg/dL (ref 0.57–1.00)
Globulin, Total: 2.7 g/dL (ref 1.5–4.5)
Glucose: 94 mg/dL (ref 70–99)
Potassium: 4.5 mmol/L (ref 3.5–5.2)
Sodium: 141 mmol/L (ref 134–144)
Total Protein: 6.7 g/dL (ref 6.0–8.5)
eGFR: 73 mL/min/{1.73_m2} (ref >59)

## 2022-07-08 LAB — TSH: TSH: 3.07 u[IU]/mL (ref 0.450–4.500)

## 2022-07-14 NOTE — Progress Notes (Signed)
Interpreted by me on 07/07/22. 1st degree AV block at 71bpm, no ST segment changes.

## 2022-07-25 ENCOUNTER — Ambulatory Visit: Payer: Self-pay

## 2022-07-25 ENCOUNTER — Telehealth (INDEPENDENT_AMBULATORY_CARE_PROVIDER_SITE_OTHER): Payer: Medicare Other | Admitting: Nurse Practitioner

## 2022-07-25 ENCOUNTER — Telehealth: Payer: Self-pay | Admitting: Family Medicine

## 2022-07-25 ENCOUNTER — Encounter: Payer: Self-pay | Admitting: Nurse Practitioner

## 2022-07-25 DIAGNOSIS — U071 COVID-19: Secondary | ICD-10-CM

## 2022-07-25 MED ORDER — MOLNUPIRAVIR EUA 200MG CAPSULE: 4.0000 | ORAL_CAPSULE | Freq: Two times a day (BID) | ORAL | 0 refills | Status: AC

## 2022-07-25 NOTE — Telephone Encounter (Signed)
Chief Complaint: COVID + Symptoms: Chest congestion, sweats,sore throat. Mild difficulty breathing Frequency: Saturday Pertinent Negatives: Patient denies  Disposition: [] ED /[] Urgent Care (no appt availability in office) / [] Appointment(In office/virtual)/ []  [facility]/ [] Home Care/ [x] Refused Recommended Disposition /[] [facility]/ []  Follow-up with PCP Additional Notes: Pt states that she started feeling poorly on Saturday, and on Sunday tested + for COVID with a home test. Pt states that she is coughing up brown phlegm.  Pt is taking left over amoxicillin from the last time she had COVID. PT would like medicine called in. Pt would like to know if she should continue the amoxicillin.     Summary: covid ,congestion, sore throat, coughing   Patient called in dx with covid, Sunday, has congestion, sore throat, coughing , since Sunday. She doesn\'t, want an appt. She is asking for medicine to be sent.       Reason for Disposition  [1] HIGH RISK patient (e.g., weak immune system, age > 64 years, obesity with BMI 30 or higher, pregnant, chronic lung disease or other chronic medical condition) AND [2] COVID symptoms (e.g., cough, fever)  (Exceptions: Already seen by PCP and no new or worsening symptoms.)  Answer Assessment - Initial Assessment Questions 1. COVID-19 DIAGNOSIS: "How do you know that you have COVID?" (e.g., positive lab test or self-test, diagnosed by doctor or NP/PA, symptoms after exposure).     Sunday 2. COVID-19 EXPOSURE: "Was there any known exposure to COVID before the symptoms began?" CDC Definition of close contact: within 6 feet (2 meters) for a total of 15 minutes or more over a 24-hour period.      no 3. ONSET: "When did the COVID-19 symptoms start?"      Saturday - hoarse. Sunday, Sore throat, runny nose 4. WORST SYMPTOM: "What is your worst symptom?" (e.g., cough, fever, shortness of breath, muscle aches)     Congestion - chest 5. COUGH:  "Do you have a cough?" If Yes, ask: "How bad is the cough?"       No - is trying to cough up  - gotten up brown ugly stuff 6. FEVER: "Do you have a fever?" If Yes, ask: "What is your temperature, how was it measured, and when did it start?"     No - had one of 99  - cold sweats 7. RESPIRATORY STATUS: "Describe your breathing?" (e.g., normal; shortness of breath, wheezing, unable to speak)      yes 8. BETTER-SAME-WORSE: "Are you getting better, staying the same or getting worse compared to yesterday?"  If getting worse, ask, "In what way?"     worse 9. OTHER SYMPTOMS: "Do you have any other symptoms?"  (e.g., chills, fatigue, headache, loss of smell or taste, muscle pain, sore throat)     Chills, Cough, congestion, BA 10. HIGH RISK DISEASE: "Do you have any chronic medical problems?" (e.g., asthma, heart or lung disease, weak immune system, obesity, etc.)       Asthma 11. VACCINE: "Have you had the COVID-19 vaccine?" If Yes, ask: "Which one, how many shots, when did you get it?"        12. PREGNANCY: "Is there any chance you are pregnant?" "When was your last menstrual period?"        13 . O2 SATURATION MONITOR:  "Do you use an oxygen saturation monitor (pulse oximeter) at home?" If Yes, ask "What is your reading (oxygen level) today?" "What is your usual oxygen saturation reading?" (e.g., 95%)  Protocols used:  Coronavirus (MDEKI-63) Diagnosed or Suspected-A-AH

## 2022-07-25 NOTE — Telephone Encounter (Signed)
The other option is Paxlovid but it interacts with her Losartan.  She is not in the higher risk category of having complications from [place] and would benefit more from continuing her Losartan.  At this time, I recommend she do that.  If her symptoms worsen, please have her call us back.

## 2022-07-25 NOTE — Progress Notes (Signed)
There were no vitals taken for this visit.   Subjective:    Patient ID: Mary Hardin, female    DOB: [birth year], 67 y.o.   MRN: [id number]  HPI: Mary Hardin is a 67 y.o. female  Chief Complaint  Patient presents with   Covid Positive    Pt states she tested positive for Covid on Sunday. States she woke up Sunday with congestion, cough, sore throat, and body aches.    UPPER RESPIRATORY TRACT INFECTION Worst symptom: Tested positive for COVID on Sunday.  Symptoms started on Saturday Fever: no Cough: yes Shortness of breath: yes Wheezing: yes Chest pain: no Chest tightness: no Chest congestion: no Nasal congestion: yes Runny nose: yes Post nasal drip: yes Sneezing: no Sore throat: yes Swollen glands: no Sinus pressure: yes Headache: yes Face pain: no Toothache: no Ear pain: yes bilateral Ear pressure: yes bilateral Eyes red/itching:no Eye drainage/crusting: yes  Vomiting: no Rash: no Fatigue: yes Sick contacts: no Strep contacts: no  Context: better Recurrent sinusitis: no Relief with OTC cold/cough medications: no  Treatments attempted: none Patient is taking an old prescription from Amoxicillin that she had left over.   Relevant past medical, surgical, family and social history reviewed and updated as indicated. Interim medical history since our last visit reviewed. Allergies and medications reviewed and updated.  Review of Systems  Constitutional:  Positive for fatigue. Negative for fever.  HENT:  Positive for congestion, ear pain, postnasal drip, rhinorrhea, sinus pressure and sore throat. Negative for dental problem, sinus pain and sneezing.   Respiratory:  Positive for cough, shortness of breath and wheezing.   Cardiovascular:  Negative for chest pain.  Gastrointestinal:  Negative for vomiting.  Skin:  Negative for rash.  Neurological:  Positive for headaches.    Per HPI unless specifically indicated above     Objective:    There were  no vitals taken for this visit.  Wt Readings from Last 3 Encounters:  07/07/22 (!) 379 lb (171.9 kg)  04/28/22 (!) 367 lb 12.8 oz (166.8 kg)  06/02/21 (!) 367 lb 6.4 oz (166.7 kg)    Physical Exam Vitals and nursing note reviewed.  Constitutional:      General: She is not in acute distress.    Appearance: She is not ill-appearing.  HENT:     Head: Normocephalic.     Right Ear: Hearing normal.     Left Ear: Hearing normal.     Nose: Nose normal.  Pulmonary:     Effort: Pulmonary effort is normal. No respiratory distress.  Neurological:     Mental Status: She is alert.  Psychiatric:        Mood and Affect: Mood normal.        Behavior: Behavior normal.        Thought Content: Thought content normal.        Judgment: Judgment normal.     Results for orders placed or performed in visit on 07/07/22  Microalbumin, Urine Waived  Result Value Ref Range   Microalb, Ur Waived 10 0 - 19 mg/L   Creatinine, Urine Waived 10 10 - 300 mg/dL   Microalb/Creat Ratio 30-300 (H) <30 mg/g  CBC with Differential/Platelet  Result Value Ref Range   WBC 6.0 3.4 - 10.8 x10E3/uL   RBC 4.03 3.77 - 5.28 x10E6/uL   Hemoglobin 11.2 11.1 - 15.9 g/dL   Hematocrit 34.2 34.0 - 46.6 %   MCV 85 79 - 97 fL   MCH 27.8  26.6 - 33.0 pg   MCHC 32.7 31.5 - 35.7 g/dL   RDW 13.3 11.7 - 15.4 %   Platelets 252 150 - 450 x10E3/uL   Neutrophils 62 Not Estab. %   Lymphs 27 Not Estab. %   Monocytes 6 Not Estab. %   Eos 4 Not Estab. %   Basos 1 Not Estab. %   Neutrophils Absolute 3.8 1.4 - 7.0 x10E3/uL   Lymphocytes Absolute 1.6 0.7 - 3.1 x10E3/uL   Monocytes Absolute 0.3 0.1 - 0.9 x10E3/uL   EOS (ABSOLUTE) 0.2 0.0 - 0.4 x10E3/uL   Basophils Absolute 0.0 0.0 - 0.2 x10E3/uL   Immature Granulocytes 0 Not Estab. %   Immature Grans (Abs) 0.0 0.0 - 0.1 x10E3/uL  Comprehensive metabolic panel  Result Value Ref Range   Glucose 94 70 - 99 mg/dL   BUN 13 8 - 27 mg/dL   Creatinine, Ser 0.88 0.57 - 1.00 mg/dL   eGFR 73  >59 mL/min/1.73   BUN/Creatinine Ratio 15 12 - 28   Sodium 141 134 - 144 mmol/L   Potassium 4.5 3.5 - 5.2 mmol/L   Chloride 103 96 - 106 mmol/L   CO2 21 20 - 29 mmol/L   Calcium 9.3 8.7 - 10.3 mg/dL   Total Protein 6.7 6.0 - 8.5 g/dL   Albumin 4.0 3.9 - 4.9 g/dL   Globulin, Total 2.7 1.5 - 4.5 g/dL   Albumin/Globulin Ratio 1.5 1.2 - 2.2   Bilirubin Total 0.3 0.0 - 1.2 mg/dL   Alkaline Phosphatase 80 44 - 121 IU/L   AST 19 0 - 40 IU/L   ALT 14 0 - 32 IU/L  Lipid Panel w/o Chol/HDL Ratio  Result Value Ref Range   Cholesterol, Total 155 100 - 199 mg/dL   Triglycerides 97 0 - 149 mg/dL   HDL 70 >39 mg/dL   VLDL Cholesterol Cal 18 5 - 40 mg/dL   LDL Chol Calc (NIH) 67 0 - 99 mg/dL  Urinalysis, Routine w reflex microscopic  Result Value Ref Range   Specific Gravity, UA 1.015 1.005 - 1.030   pH, UA 7.0 5.0 - 7.5   Color, UA Yellow Yellow   Appearance Ur Clear Clear   Leukocytes,UA Negative Negative   Protein,UA Negative Negative/Trace   Glucose, UA Negative Negative   Ketones, UA Negative Negative   RBC, UA Negative Negative   Bilirubin, UA Negative Negative   Urobilinogen, Ur 0.2 0.2 - 1.0 mg/dL   Nitrite, UA Negative Negative   Microscopic Examination Comment   TSH  Result Value Ref Range   TSH 3.070 0.450 - 4.500 uIU/mL      Assessment & Plan:   Problem List Items Addressed This Visit   None Visit Diagnoses     COVID-19    -  Primary   Will treat with molnupiravir due to asthma. Make sure to stay well hydrated. Can use OTC medication for symptom management. FU if symptoms not improved.   Relevant Medications   molnupiravir EUA (LAGEVRIO) 200 mg CAPS capsule        Follow up plan: Return if symptoms worsen or fail to improve.  This visit was completed via MyChart due to the restrictions of the COVID-19 pandemic. All issues as above were discussed and addressed. Physical exam was done as above through visual confirmation on MyChart. If it was felt that the patient  should be evaluated in the office, they were directed there. The patient verbally consented to this visit. Location of the  patient: Home Location of the provider: Office Those involved with this call:  Provider: Jon Billings, NP CMA: Yvonna Alanis, [place] Desk/Registration: Lynnell Catalan This encounter was conducted via video.  I spent 20 dedicated to the care of this patient on the date of this encounter to include previsit review of symptoms, plan of care, and follow up, face to face time with the patient, and post visit ordering of testing.

## 2022-07-25 NOTE — Telephone Encounter (Signed)
Called and notified patient of Karen's message. Patient verbalized understanding.  

## 2022-07-25 NOTE — Telephone Encounter (Signed)
Depends on when her symptoms started. Needs virtual appt

## 2022-07-25 NOTE — Telephone Encounter (Signed)
Pt is calling to report that the molnupiravir EUA (LAGEVRIO) 200 mg CAPS capsule [[id number]]  is $800. Wanting to know is there a medication that is covered under the [employer] insurance plan? CB- [phone]

## 2022-12-03 ENCOUNTER — Encounter: Payer: Self-pay | Admitting: Family Medicine

## 2022-12-04 NOTE — Telephone Encounter (Signed)
Called and scheduled patient for 12/07/2022 @ 4:20 pm.

## 2022-12-04 NOTE — Telephone Encounter (Signed)
Appt please

## 2022-12-07 ENCOUNTER — Encounter: Payer: Self-pay | Admitting: Family Medicine

## 2022-12-07 ENCOUNTER — Telehealth (INDEPENDENT_AMBULATORY_CARE_PROVIDER_SITE_OTHER): Payer: Medicare Other | Admitting: Family Medicine

## 2022-12-07 DIAGNOSIS — F339 Major depressive disorder, recurrent, unspecified: Secondary | ICD-10-CM | POA: Diagnosis not present

## 2022-12-07 DIAGNOSIS — I700 Atherosclerosis of aorta: Secondary | ICD-10-CM

## 2022-12-07 MED ORDER — SEMAGLUTIDE-WEIGHT MANAGEMENT 1 MG/0.5ML ~~LOC~~ SOAJ: 1.0000 mg | SUBCUTANEOUS | 0 refills | Status: DC

## 2022-12-07 MED ORDER — SEMAGLUTIDE-WEIGHT MANAGEMENT 2.4 MG/0.75ML ~~LOC~~ SOAJ: 2.4000 mg | SUBCUTANEOUS | 3 refills | Status: DC

## 2022-12-07 MED ORDER — SEMAGLUTIDE-WEIGHT MANAGEMENT 0.5 MG/0.5ML ~~LOC~~ SOAJ: 0.5000 mg | SUBCUTANEOUS | 0 refills | Status: AC

## 2022-12-07 MED ORDER — SEMAGLUTIDE-WEIGHT MANAGEMENT 1.7 MG/0.75ML ~~LOC~~ SOAJ: 1.7000 mg | SUBCUTANEOUS | 0 refills | Status: DC

## 2022-12-07 MED ORDER — SEMAGLUTIDE-WEIGHT MANAGEMENT 0.25 MG/0.5ML ~~LOC~~ SOAJ: 0.2500 mg | SUBCUTANEOUS | 0 refills | Status: AC

## 2022-12-07 NOTE — Patient Instructions (Addendum)
Kimber Relic Clinical Social Work/Therapist, MSW, LCSW Verified [place], [place] [address]  Waitlist for new clients I am pleased you are considering working with me. We all have issues which need improvement. Life is a journey of change in which therapy can be a supportive tool. Email   Photo of Darius Bump, Clinical Social Work/Therapist in [address], [place] Candace Rogers Clinical Social Work/Therapist, MSW, LCSW Verified [place], [place] [address]  (Online Only) Not accepting new clients Are you ready to take the first step towards healing? You have already begun as you are here. As a therapist my goal is to provide a safe space for your healing. Have you or your child experienced trauma? Have you experienced trauma a child or an adult? Do you have stress or trauma related to your job? If you experience anxiety, depression, or issues in your closest relationships that you want to get to the bottom of, you are in the right place. [phone]    Teletherapy for [facility] [employer], [place], Clinical Social Work/Therapist in [address] [facility], [facility] Clinical Social Work/Therapist, MSW, [place], LISWCP Verified [address] Endorsed [place], [place] [address]  (Online Only) At [employer], we're here to help you reach your highest potential. Our comprehensive and compassionate virtual therapy sessions address your needs in a careful, confidential manner. We take the guesswork out of the therapy process and remove the stigma often attached to mental health by offering a relaxed, solutions-focused approach. Arise and begin your journey to reach the best version of yourself!   Photo of [facility], Clinical Social Work/Therapist in [facility] Clinical Social Work/Therapist, LCSW Verified [place], [place] [address]  (Online Only) Here at [facility] I am currently offering online sessions while working on adding Equine Assisted Therapy to my new farm in [place],  [place]. We believe in helping people through therapy. Equine Assisted Therapy utilizes horses for emotional growth and learning. The modality is experiential which means participants learn about themselves and others by participating in activities with the horses and processing their experience as it relates to them. We also offer Cognitive Behavioral Therapy, Solution Focused, Mindfulness, and Positive Exposure Therapy. Email [place]: [email or web address]  [phone]   Photo of [facility] Serita Kyle, Licensed Clinical Mental Health Counselor in [facility] Renee Hopkins Licensed Clinical Mental Health Counselor, [facility] Verified [place], [place] [address]  Challenges come in all different walks of life, shapes, and sizes. It's the universal one thing everyone must deal with. As a therapist, I try to help my clients develop the skills to be able to deal with whatever challenges they may face with courage, dignity, and the strength to know that eventually they will overcome these challenges. For many these challenges include debilitating depression, intense anxiety, fractured communication skills, trauma, substance abuse, or any combination of those. Although admitting the need and asking for help may be hard, the skills developed in therapy can definitely be worth it. [phone]   Photo of [facility], [facility]., Clinical Social Work/Therapist in [facility], [facility]. Clinical Social Work/Therapist, LCSW, CCTP Verified [address] Endors[address] [place], [place] [address]  Currently accepting new clients ([employer], [place] self pay). I am a licensed clinical Child psychotherapist, addictions specialist, and trauma specialist who works with adults and adolescence (16+) with trauma (PTSD), addiction, depression, anxiety, and other diagnosis. I am trained in providing brainspotting as a progressive form of therapy intervention to  treat all things from  PTSD to mental blocks with sports. I integrate other trauma informed approaches as each individual has unique goals and complex needs. Together, lets work towards better understanding the course of your symptoms and finding solutions to bring you to your best self. [phone]   Photo of Tab Ballis, Clinical Social Work/Therapist in [place] [employer] Clinical Social Work/Therapist, LCSW, LCAS, CCS Verified [address] [place], [place] [address]  My approach to the collaborative effort of psychotherapy is influenced by structural/strategic family systems theory, informing the process of joining with clients to clarify their roles in relationship, family, and community. Creative engagement in experiential learning assists individuals, couples, and families in gaining a fresh perspective through focused work that brings lasting results. I endeavor, in every client relationship, to de-mystify the daunting prospect of change, empowering personal choice in difficult circumstances through guided self-exploration. [phone]   Photo of J-Lee Katrinka Blazing, Clinical Social Work/Therapist in [place] J-Lee Grafton Clinical Social Work/Therapist, DSWc, MSW, LCSW, LCASA Verified [address] End[address] [place], [place] [address]  (Online Only) A combination of personal life experiences as a LGBTQ member coupled with decades of professional trainings and educational opportunities over the past 20 plus years of working in the mental health and substance abuse fields, I have developed a large and diverse toolbox of treatment strategies and interventions to support you in achieving your desired self-empowered state of being. [phone] View Photo of [facility], Clinical Social Work/Therapist in [facility] Clinical Social Work/Therapist, LCSW, LCAS Verified [address] End[address] [place], [place] [address]  (Online Only) Do you  find yourself often feeling stressed, anxious, or depressed? Do you find yourself relying on alcohol or other addictive substances or behaviors to cope with life stresses? Do you feel like you don't know what direction your life is going and need guidance in identifying and accomplishing your personal goals? If you answer yes to any of these questions and you are ready to begin the process of healing and recovery, you do not need to search any further. [phone]   Photo of [facility], Marriage & Family Therapist in [address] [facility] Marriage & Family Therapist, [place], LMFT, LCSW, LPC, LCAS Verified [address] Endors[address] [place], [place] [address]  Are you feeling overwhelmed, depressed, anxious, or just not like "you?" Does it feel like extra work to just get through the day? We get it. We're here to help you feel at peace, calm, and hopeful - no matter what is happening in your world. [phone] View Photo of Ernst Bowler, Clinical Social Work/Therapist in [place] Kasha Romilda Joy Clinical Social Work/Therapist, LCSW, [place], CDBT, CCTP Verified [address] [place], [place] [address]  As a Clinical Trauma therapist, I know the choice to begin treatment takes courage & purpose & I am committed to providing Safety & Stabilization, processing your traumatic memories and making meaning for self. As well as developing sense of personal & relational integrity and well-being. My practice is rooted in genuineness, desire and a core belief that the human life force is resilient and giving. To get the most out of your TX I need you to approach therapy esp. if you're suffering from trauma, as a collective effort being open and honest about your TX plan/goals. [phone]   Photo of Kathee Polite, Research scientist (physical sciences) in [facility] [place] Licensed Pharmacist, hospital, [facility] Verified [address]  [place], [place] [address]  (Online Only) Calling All Women,  especially Single Women. You know, sometimes life will throw you a curve, making it difficult to get back on track. I have been counseling for over (20) years. My primary focus has been with concerns on anxiety, depression, grief and self-esteem. Clients' counseling issues include personal, emotional, professional, and relationship problems. When appropriate, I will use Christian Counseling. I will use play therapy, and expressive art therapy techniques with clients. I want you to be healed, mind, body and soul. [phone]   Photo of [facility], [facility], Clinical Social Work/Therapist in [place] [facility] [place], [facility] Clinical Social Work/Therapist, MSW, LCSW, CGIP Verified [address] [place], [place] [address]  Are you at that point where you feel like taking control of your life but just don't know how? I understand how hard it could be to get started, fret no more, I am here to help you figure out how to get started. We all need support and encouragement on the journey of life, I am that therapist who understands that and stick with you through it all. I understand that life can throw [place] curved balls from time to time as we navigate through life journey. I cannot promise you that the road to finding lifetime fulfillment will not be rough, but I can assure you that we will walk the rough roads together and emerge stronger and better. [phone]   Photo of Orson Slick, Lic Clinical Mental Health Counselor Associate in [place] [facility] Counselor Associate, BSW, [facility], [facility] Verified [place], [place] [address]  (Online Only) I believe that people are resilient and innately possess everything they need to accomplish their goals. Through our counseling relationship I will encourage you to become self aware, take responsibility for current thought  processes and behavior patterns that may have a negative effect on your life, and commit to transformation. That may read like a strong statement but know that I will be with you the entirety of the journey. In our work we will uncover past traumas, confront negative thoughts and behavior patterns, and create plans to reach your goals. [phone]   Photo of [facility], Clinical Social Work/Therapist in [address] [facility] Clinical Social Work/Therapist, [place], LCSW, LISW-CP, [place], LPHA Verified [place], [place] [address]  CREATING THE CONDITIONS FOR CHANGE in your lifecourse through pyschotherapy and life coaching... Our support is strengths based and empathetic to the challenges that make it difficult for you to create the life that you want. Struggling in your relationships? Trying to deal with difficult events in your life? We don't preach, stand on soap boxes or point fingers...our job is to momentarily hold what you "can't" so you can "work it out"! We simply help you connect the loose connections. That is how emotional growth happens and how we help CREATE THE CONDITIONS FOR CHANGE. [phone]   Photo of Lavena Stanford, Marriage & Family Therapist in [place] [facility] Marriage & Family Therapist, MS, LMFT, CFLE, [facility] Verified [place], [place] [address]  Need help figuring out why your relationship just keeps doing the same thing over and over? Do you feel like no one is actually hearing what you? Is it hard to get past something that happened a while back? We often keep repeating things because we think if we just do it again or do differently we'll finally unlock the barrier that's held [place] back. My people come to see me after trying something over and  over and they feel defeated. Often, the secret is a small tweak from a slightly different angle that helps with trauma, anxiety, depression,  communication, and/or relationships. View Photo of Lorra Hals, Clinical Social Work/Therapist in [facility] Clinical Social Work/Therapist, LCSW Verified [place], [place] [address]  (Online Only) I have earned certificates in [employer], Health Psychology and Nutritional Psychology and we will look at you as the "total package". Taking care of ourselves physically, emotionally and socially is my top priority. I have been a therapist since 2004 and have worked with all ages. It can be scary to think about reaching out to a stranger to discuss the most vulnerable parts of ourselves. Therapy allows you to talk about the difficulties you're experiencing & be provided with unbiased support & space to reflect. Together, we'll set goals that address the struggles in your life. [phone]   Photo of [facility], Counselor in [facility] Counselor, MA, [place], [facility] Verified [place], [place] [address]  (Online Only) Accepting New Clients - I am a Statistician. What this means is that I use psychology and Biblical principles to help you create and live your best life possible. This is important to me because all my life I've had dreams about Big accomplishments, Big success, and Big things. When I looked around, I was convinced that I was living the wrong life. It wasn't until I began to learn and apply Biblical principles to my life that I realized that I wasn't living the wrong life, but my life was what I created. I had to unlearn, relearn, and then begin creating the life and relationships I desired. [phone]   Photo of Brayton El, Clinical Social Work/Therapist in [facility] Clinical Social Work/Therapist, LCSW, LCAS Verified [address] [place], [place] [address]  Feeling overwhelmed? By life, work or just in general? Things seem to be  impossible at times. I am here to help navigate through these times. We can work on coping skills to help deal with things as they come up. I am a Licensed Visual merchandiser. I have specialized training in Substance Use. I work with both teens and adults experiencing issues related to depression and anxiety, mood disorders, as well as problems related to adjusting to life changes. [phone]   Photo of Jacklynn Barnacle, Licensed Clinical Mental Health Counselor in [facility] Viviann Spare Villacis Licensed Clinical Mental Health Counselor, [facility], [facility] Verified [place], [place] [address]  Chelsea Aus is a Licensed Clinical Mental Health Counselor [facility]) with 7 years of experience working with children, adolescents, and adults in community, school, and hospital settings to address mental health and psychosocial needs. He works with clients experiencing depression, anxiety, trauma, behavioral issues, challenges with life transitions, grief and loss, stress at work or at home, and challenges with relationships. He completed his Tax adviser of Arts (BA) in Holiday representative at [employer] and his Teacher, music (MEd) in Mental Health Counseling at [facility] [phone]   Photo of Cain Saupe, Clinical Social Work/Therapist in [facility] Cain Saupe Clinical Social Work/Therapist, MSSW, LCSW, ACSW Verified [place], [place] [address]  Everyone has the ability to function better in their personal and professional lives. Sometimes, one just needs a little guidance on how to achieve this goal. If you're tired of your current strategies falling flat and are open to some new ideas, I'd love to help.  I have over 29 years of experience in helping couples, families, teens, and children feel better, get along with each other better, and achieve their personal goals. Together, we can come up with the right solutions for you. [phone]   Photo  of Margot Ables, Clinical Social Work/Therapist in [facility] Clinical Social Work/Therapist, LCSW Verified [place], [place] [address]  Sometimes in life, we just feel overwhelmed. Anxious, depressed, scared. Old behavior patterns can often cause [place] to feel "stuck". Having a trained, experienced, and supportive professional to guide you through these frustrating, and often painful experiences, can be the key to making the change you want in your life. [phone]

## 2022-12-07 NOTE — Assessment & Plan Note (Signed)
Would like to start wegovy. She is aware that it is currently not covered by her insurance and that the cost is very expensive. She was made aware that with medicare she cannot use coupon cards. She would still like to pay out of pocket. She has known aortic atherosclerosis but does not carry a diagnosis of CAD. She would be interested in seeing cardiology for a CT-calcium score. Rx sent to her pharmacy. Follow up in about 6 weeks.

## 2022-12-07 NOTE — Assessment & Plan Note (Signed)
Would like to see cardiology for CT-calcium score to look for CAD. Referral generated today. Call with any concerns.

## 2022-12-07 NOTE — Assessment & Plan Note (Signed)
Would like to speak to a counselor. List of counselors provided today.

## 2022-12-07 NOTE — Progress Notes (Signed)
There were no vitals taken for this visit.   Subjective:    Patient ID: Mary Hardin, female    DOB: 11-13-1955, 67 y.o.   MRN: [id number]  HPI: Mary Hardin is a 67 y.o. female  Chief Complaint  Patient presents with   Obesity    Patient says she would like to discuss treatment for Weight Loss.    Obesity Duration: chronic Previous attempts at weight loss: yes Complications of obesity: HTN, aortic atherosclerosis, GERD, osteoarthritis, Depression, OSA Peak weight: 380  Weight loss goal: to be healthy Weight loss to date: 1lb Requesting obesity pharmacotherapy: yes Current weight loss supplements/medications: no Previous weight loss supplements/meds: yes 20-25 years ago  Relevant past medical, surgical, family and social history reviewed and updated as indicated. Interim medical history since our last visit reviewed. Allergies and medications reviewed and updated.  Review of Systems  Constitutional: Negative.   HENT: Negative.    Respiratory:  Positive for shortness of breath. Negative for apnea, cough, choking, chest tightness, wheezing and stridor.   Cardiovascular:  Positive for chest pain. Negative for palpitations and leg swelling.  Gastrointestinal: Negative.   Musculoskeletal: Negative.   Neurological: Negative.   Psychiatric/Behavioral: Negative.      Per HPI unless specifically indicated above     Objective:    There were no vitals taken for this visit.  Wt Readings from Last 3 Encounters:  07/07/22 (!) 379 lb (171.9 kg)  04/28/22 (!) 367 lb 12.8 oz (166.8 kg)  06/02/21 (!) 367 lb 6.4 oz (166.7 kg)    Physical Exam Vitals and nursing note reviewed.  Constitutional:      General: She is not in acute distress.    Appearance: Normal appearance. She is obese. She is not ill-appearing, toxic-appearing or diaphoretic.  HENT:     Head: Normocephalic and atraumatic.     Right Ear: External ear normal.     Left Ear: External ear normal.     Nose:  Nose normal.     Mouth/Throat:     Mouth: Mucous membranes are moist.     Pharynx: Oropharynx is clear.  Eyes:     General: No scleral icterus.       Right eye: No discharge.        Left eye: No discharge.     Conjunctiva/sclera: Conjunctivae normal.     Pupils: Pupils are equal, round, and reactive to light.  Pulmonary:     Effort: Pulmonary effort is normal. No respiratory distress.     Comments: Speaking in full sentences Musculoskeletal:        General: Normal range of motion.     Cervical back: Normal range of motion.  Skin:    Coloration: Skin is not jaundiced or pale.     Findings: No bruising, erythema, lesion or rash.  Neurological:     Mental Status: She is alert and oriented to person, place, and time. Mental status is at baseline.  Psychiatric:        Mood and Affect: Mood normal.        Behavior: Behavior normal.        Thought Content: Thought content normal.        Judgment: Judgment normal.     Results for orders placed or performed in visit on 07/07/22  Microalbumin, Urine Waived  Result Value Ref Range   Microalb, Ur Waived 10 0 - 19 mg/L   Creatinine, Urine Waived 10 10 - 300 mg/dL   Microalb/Creat  Ratio 30-300 (H) <30 mg/g  CBC with Differential/Platelet  Result Value Ref Range   WBC 6.0 3.4 - 10.8 x10E3/uL   RBC 4.03 3.77 - 5.28 x10E6/uL   Hemoglobin 11.2 11.1 - 15.9 g/dL   Hematocrit 40.9 81.1 - 46.6 %   MCV 85 79 - 97 fL   MCH 27.8 26.6 - 33.0 pg   MCHC 32.7 31.5 - 35.7 g/dL   RDW 91.4 78.2 - 95.6 %   Platelets 252 150 - 450 x10E3/uL   Neutrophils 62 Not Estab. %   Lymphs 27 Not Estab. %   Monocytes 6 Not Estab. %   Eos 4 Not Estab. %   Basos 1 Not Estab. %   Neutrophils Absolute 3.8 1.4 - 7.0 x10E3/uL   Lymphocytes Absolute 1.6 0.7 - 3.1 x10E3/uL   Monocytes Absolute 0.3 0.1 - 0.9 x10E3/uL   EOS (ABSOLUTE) 0.2 0.0 - 0.4 x10E3/uL   Basophils Absolute 0.0 0.0 - 0.2 x10E3/uL   Immature Granulocytes 0 Not Estab. %   Immature Grans (Abs) 0.0  0.0 - 0.1 x10E3/uL  Comprehensive metabolic panel  Result Value Ref Range   Glucose 94 70 - 99 mg/dL   BUN 13 8 - 27 mg/dL   Creatinine, Ser 2.13 0.57 - 1.00 mg/dL   eGFR 73 >08 MV/HQI/6.96   BUN/Creatinine Ratio 15 12 - 28   Sodium 141 134 - 144 mmol/L   Potassium 4.5 3.5 - 5.2 mmol/L   Chloride 103 96 - 106 mmol/L   CO2 21 20 - 29 mmol/L   Calcium 9.3 8.7 - 10.3 mg/dL   Total Protein 6.7 6.0 - 8.5 g/dL   Albumin 4.0 3.9 - 4.9 g/dL   Globulin, Total 2.7 1.5 - 4.5 g/dL   Albumin/Globulin Ratio 1.5 1.2 - 2.2   Bilirubin Total 0.3 0.0 - 1.2 mg/dL   Alkaline Phosphatase 80 44 - 121 IU/L   AST 19 0 - 40 IU/L   ALT 14 0 - 32 IU/L  Lipid Panel w/o Chol/HDL Ratio  Result Value Ref Range   Cholesterol, Total 155 100 - 199 mg/dL   Triglycerides 97 0 - 149 mg/dL   HDL 70 >29 mg/dL   VLDL Cholesterol Cal 18 5 - 40 mg/dL   LDL Chol Calc (NIH) 67 0 - 99 mg/dL  Urinalysis, Routine w reflex microscopic  Result Value Ref Range   Specific Gravity, UA 1.015 1.005 - 1.030   pH, UA 7.0 5.0 - 7.5   Color, UA Yellow Yellow   Appearance Ur Clear Clear   Leukocytes,UA Negative Negative   Protein,UA Negative Negative/Trace   Glucose, UA Negative Negative   Ketones, UA Negative Negative   RBC, UA Negative Negative   Bilirubin, UA Negative Negative   Urobilinogen, Ur 0.2 0.2 - 1.0 mg/dL   Nitrite, UA Negative Negative   Microscopic Examination Comment   TSH  Result Value Ref Range   TSH 3.070 0.450 - 4.500 uIU/mL      Assessment & Plan:   Problem List Items Addressed This Visit       Cardiovascular and Mediastinum   Atherosclerosis of aorta (HCC)    Would like to see cardiology for CT-calcium score to look for CAD. Referral generated today. Call with any concerns.       Relevant Orders   Ambulatory referral to Cardiology     Other   Depression, recurrent [facility])    Would like to speak to a counselor. List of counselors provided today.  Morbid obesity (HCC) - Primary    Would  like to start wegovy. She is aware that it is currently not covered by her insurance and that the cost is very expensive. She was made aware that with medicare she cannot use coupon cards. She would still like to pay out of pocket. She has known aortic atherosclerosis but does not carry a diagnosis of CAD. She would be interested in seeing cardiology for a CT-calcium score. Rx sent to her pharmacy. Follow up in about 6 weeks.       Relevant Medications   Semaglutide-Weight Management 0.25 MG/0.5ML SOAJ   Semaglutide-Weight Management 0.5 MG/0.5ML SOAJ (Start on 01/05/2023)   Semaglutide-Weight Management 1 MG/0.5ML SOAJ (Start on 02/03/2023)   Semaglutide-Weight Management 1.7 MG/0.75ML SOAJ (Start on 03/04/2023)   Semaglutide-Weight Management 2.4 MG/0.75ML SOAJ (Start on 04/02/2023)   Other Relevant Orders   Ambulatory referral to Cardiology     Follow up plan: Return June follow up.    This visit was completed via video visit through MyChart due to the restrictions of the COVID-19 pandemic. All issues as above were discussed and addressed. Physical exam was done as above through visual confirmation on video through MyChart. If it was felt that the patient should be evaluated in the office, they were directed there. The patient verbally consented to this visit. Location of the patient: home Location of the provider: work Those involved with this call:  Provider: Olevia Perches, DO CMA: Malen Gauze, CMA Front Desk/Registration:  Servando Snare   Time spent on call:  15 minutes with patient face to face via video conference. More than 50% of this time was spent in counseling and coordination of care. 23 minutes total spent in review of patient's record and preparation of their chart.

## 2022-12-31 ENCOUNTER — Other Ambulatory Visit: Payer: Self-pay | Admitting: Family Medicine

## 2022-12-31 DIAGNOSIS — F339 Major depressive disorder, recurrent, unspecified: Secondary | ICD-10-CM

## 2023-01-02 NOTE — Telephone Encounter (Signed)
Requested Prescriptions  Pending Prescriptions Disp Refills   losartan (COZAAR) 25 MG tablet [Pharmacy Med Name: LOSARTAN 25MG  TABLETS] 90 tablet 1    Sig: TAKE 1 TABLET(25 MG) BY MOUTH DAILY     Cardiovascular:  Angiotensin Receptor Blockers Passed - 12/31/2022 10:17 AM      Passed - Cr in normal range and within 180 days    Creatinine, Ser  Date Value Ref Range Status  07/07/2022 0.88 0.57 - 1.00 mg/dL Final         Passed - K in normal range and within 180 days    Potassium  Date Value Ref Range Status  07/07/2022 4.5 3.5 - 5.2 mmol/L Final         Passed - Patient is not pregnant      Passed - Last BP in normal range    BP Readings from Last 1 Encounters:  07/07/22 130/88         Passed - Valid encounter within last 6 months    Recent Outpatient Visits           3 weeks ago Morbid obesity (HCC)   [place] [facility], Megan P, DO   5 months ago COVID-19   [place] [facility] Larae Grooms, NP   5 months ago [employer] preventive visit   [facility], Megan P, DO   6 months ago Osteoarthritis, unspecified osteoarthritis type, unspecified site   [facility], Megan P, DO   8 months ago Osteoarthritis, unspecified osteoarthritis type, unspecified site   [facility], Megan P, DO               escitalopram (LEXAPRO) 20 MG tablet [Pharmacy Med Name: ESCITALOPRAM 20MG  TABLETS] 90 tablet 1    Sig: TAKE 1 TABLET(20 MG) BY MOUTH DAILY     Psychiatry:  Antidepressants - SSRI Passed - 12/31/2022 10:17 AM      Passed - Completed PHQ-2 or PHQ-9 in the last 360 days      Passed - Valid encounter within last 6 months    Recent Outpatient Visits           3 weeks ago Morbid obesity (HCC)   [facility] [place], Megan P, DO   5 months ago COVID-19   [facility],  Clydie Braun, NP   5 months ago [facility] to [employer] preventive visit   [place] [facility], Megan P, DO   6 months ago Osteoarthritis, unspecified osteoarthritis type, unspecified site   [facility], Megan P, DO   8 months ago Osteoarthritis, unspecified osteoarthritis type, unspecified site    [facility], [facility] P, DO               meloxicam (MOBIC) 15 MG tablet [Pharmacy Med Name: MELOXICAM 15MG  TABLETS] 90 tablet 1    Sig: TAKE 1 TABLET(15 MG) BY MOUTH DAILY     Analgesics:  COX2 Inhibitors Failed - 12/31/2022 10:17 AM      Failed - Manual Review: Labs are only required if the patient has taken medication for more than 8 weeks.      Passed - HGB in normal range and within 360 days    Hemoglobin  Date Value Ref Range Status  07/07/2022 11.2 11.1 - 15.9 g/dL Final  Passed - Cr in normal range and within 360 days    Creatinine, Ser  Date Value Ref Range Status  07/07/2022 0.88 0.57 - 1.00 mg/dL Final         Passed - HCT in normal range and within 360 days    Hematocrit  Date Value Ref Range Status  07/07/2022 34.2 34.0 - 46.6 % Final         Passed - AST in normal range and within 360 days    AST  Date Value Ref Range Status  07/07/2022 19 0 - 40 IU/L Final         Passed - ALT in normal range and within 360 days    ALT  Date Value Ref Range Status  07/07/2022 14 0 - 32 IU/L Final         Passed - eGFR is 30 or above and within 360 days    GFR calc Af Amer  Date Value Ref Range Status  06/24/2020 105 >59 mL/min/1.73 Final    Comment:    **In accordance with recommendations from the NKF-ASN Task force,**   [facility] is in the process of updating its eGFR calculation to the   2021 CKD-EPI creatinine equation that estimates kidney function   without a race variable.    GFR calc non Af Amer  Date Value Ref Range Status  06/24/2020 91 >59 mL/min/1.73 Final   eGFR  Date  Value Ref Range Status  07/07/2022 73 >59 mL/min/1.73 Final         Passed - Patient is not pregnant      Passed - Valid encounter within last 12 months    Recent Outpatient Visits           3 weeks ago Morbid obesity (HCC)   [facility], Megan P, DO   5 months ago COVID-19   [facility] Larae Grooms, NP   5 months ago [facility] to [employer] preventive visit   [place], Megan P, DO   6 months ago Osteoarthritis, unspecified osteoarthritis type, unspecified site   [facility], Megan P, DO   8 months ago Osteoarthritis, unspecified osteoarthritis type, unspecified site   [facility] [place], [place], DO

## 2023-01-17 ENCOUNTER — Encounter: Payer: Self-pay | Admitting: Family Medicine

## 2023-01-18 MED ORDER — EPINEPHRINE 0.3 MG/0.3ML IJ SOAJ: 0.3000 mg | INTRAMUSCULAR | 12 refills | Status: AC | PRN

## 2023-02-06 ENCOUNTER — Telehealth: Payer: Self-pay

## 2023-02-06 NOTE — Telephone Encounter (Signed)
PA for [facility] initiated and submitted via Cover My Meds. Key: [id number]

## 2023-02-07 NOTE — Telephone Encounter (Signed)
PA approved. Patient notified of approval via Mychart message.  

## 2023-02-09 ENCOUNTER — Encounter: Payer: Self-pay | Admitting: Family Medicine

## 2023-02-09 DIAGNOSIS — G8929 Other chronic pain: Secondary | ICD-10-CM

## 2023-02-14 ENCOUNTER — Telehealth: Payer: Medicare Other | Admitting: Physician Assistant

## 2023-02-15 NOTE — Telephone Encounter (Signed)
Referral placed.

## 2023-02-22 ENCOUNTER — Encounter: Payer: Self-pay | Admitting: Family Medicine

## 2023-02-22 ENCOUNTER — Telehealth (INDEPENDENT_AMBULATORY_CARE_PROVIDER_SITE_OTHER): Payer: Medicare Other | Admitting: Family Medicine

## 2023-02-22 DIAGNOSIS — F339 Major depressive disorder, recurrent, unspecified: Secondary | ICD-10-CM | POA: Diagnosis not present

## 2023-02-22 DIAGNOSIS — I700 Atherosclerosis of aorta: Secondary | ICD-10-CM

## 2023-02-22 DIAGNOSIS — Z6841 Body Mass Index (BMI) 40.0 and over, adult: Secondary | ICD-10-CM

## 2023-02-22 DIAGNOSIS — D692 Other nonthrombocytopenic purpura: Secondary | ICD-10-CM | POA: Diagnosis not present

## 2023-02-22 MED ORDER — SEMAGLUTIDE-WEIGHT MANAGEMENT 1 MG/0.5ML ~~LOC~~ SOAJ: 1.0000 mg | SUBCUTANEOUS | 0 refills | Status: AC

## 2023-02-22 MED ORDER — SEMAGLUTIDE-WEIGHT MANAGEMENT 2.4 MG/0.75ML ~~LOC~~ SOAJ: 2.4000 mg | SUBCUTANEOUS | 1 refills | Status: AC

## 2023-02-22 MED ORDER — WEGOVY 0.5 MG/0.5ML ~~LOC~~ SOAJ: 0.5000 mg | SUBCUTANEOUS | 0 refills | Status: DC

## 2023-02-22 MED ORDER — SEMAGLUTIDE-WEIGHT MANAGEMENT 1.7 MG/0.75ML ~~LOC~~ SOAJ: 1.7000 mg | SUBCUTANEOUS | 0 refills | Status: AC

## 2023-02-22 NOTE — Assessment & Plan Note (Signed)
Doing OK. Has list of counselors.

## 2023-02-22 NOTE — Assessment & Plan Note (Signed)
Stable.       - Continue to monitor

## 2023-02-22 NOTE — Progress Notes (Signed)
Called and scheduled appointment in person on 07/12/2023 @ 10:00 am.

## 2023-02-22 NOTE — Assessment & Plan Note (Signed)
Down 13lbs in the first 3 weeks of wegovy. Will continue titration up. Discussed that jumping doses is not recommended due to side effects. She would like to change to compounded pharmacy due to cost. Rx sent to [facility] today.

## 2023-02-22 NOTE — Progress Notes (Signed)
Wt (!) 367 lb (166.5 kg)   BMI 55.80 kg/m    Subjective:    Patient ID: Mary Hardin, female    DOB: July 22, 1956, 67 y.o.   MRN: [id number]  HPI: Mary Hardin is a 67 y.o. female  Chief Complaint  Patient presents with   Obesity    Pt states she think she needs the full dose of wegovy   OBESITY- started on her wegovy 3 weeks ago Duration: chronic Previous attempts at weight loss: yes Complications of obesity:  Peak weight: 380 Weight loss goal: to be healthy Weight loss to date: 13lbs Requesting obesity pharmacotherapy: yes Current weight loss supplements/medications: yes Previous weight loss supplements/meds: no  Relevant past medical, surgical, family and social history reviewed and updated as indicated. Interim medical history since our last visit reviewed. Allergies and medications reviewed and updated.  Review of Systems  Constitutional: Negative.   Respiratory: Negative.    Cardiovascular: Negative.   Gastrointestinal: Negative.   Musculoskeletal:  Positive for back pain. Negative for gait problem, joint swelling, myalgias, neck pain and neck stiffness.  Skin: Negative.   Neurological: Negative.   Psychiatric/Behavioral: Negative.      Per HPI unless specifically indicated above     Objective:    Wt (!) 367 lb (166.5 kg)   BMI 55.80 kg/m   Wt Readings from Last 3 Encounters:  02/22/23 (!) 367 lb (166.5 kg)  07/07/22 (!) 379 lb (171.9 kg)  04/28/22 (!) 367 lb 12.8 oz (166.8 kg)    Physical Exam Vitals and nursing note reviewed.  Constitutional:      General: She is not in acute distress.    Appearance: Normal appearance. She is obese. She is not ill-appearing, toxic-appearing or diaphoretic.  HENT:     Head: Normocephalic and atraumatic.     Right Ear: External ear normal.     Left Ear: External ear normal.     Nose: Nose normal.     Mouth/Throat:     Mouth: Mucous membranes are moist.     Pharynx: Oropharynx is clear.  Eyes:      General: No scleral icterus.       Right eye: No discharge.        Left eye: No discharge.     Conjunctiva/sclera: Conjunctivae normal.     Pupils: Pupils are equal, round, and reactive to light.  Pulmonary:     Effort: Pulmonary effort is normal. No respiratory distress.     Comments: Speaking in full sentences Musculoskeletal:        General: Normal range of motion.     Cervical back: Normal range of motion.  Skin:    Coloration: Skin is not jaundiced or pale.     Findings: No bruising, erythema, lesion or rash.  Neurological:     Mental Status: She is alert and oriented to person, place, and time. Mental status is at baseline.  Psychiatric:        Mood and Affect: Mood normal.        Behavior: Behavior normal.        Thought Content: Thought content normal.        Judgment: Judgment normal.     Results for orders placed or performed in visit on 07/07/22  Microalbumin, Urine Waived  Result Value Ref Range   Microalb, Ur Waived 10 0 - 19 mg/L   Creatinine, Urine Waived 10 10 - 300 mg/dL   Microalb/Creat Ratio 30-300 (H) <30 mg/g  CBC  with Differential/Platelet  Result Value Ref Range   WBC 6.0 3.4 - 10.8 x10E3/uL   RBC 4.03 3.77 - 5.28 x10E6/uL   Hemoglobin 11.2 11.1 - 15.9 g/dL   Hematocrit 28.4 13.2 - 46.6 %   MCV 85 79 - 97 fL   MCH 27.8 26.6 - 33.0 pg   MCHC 32.7 31.5 - 35.7 g/dL   RDW 44.0 10.2 - 72.5 %   Platelets 252 150 - 450 x10E3/uL   Neutrophils 62 Not Estab. %   Lymphs 27 Not Estab. %   Monocytes 6 Not Estab. %   Eos 4 Not Estab. %   Basos 1 Not Estab. %   Neutrophils Absolute 3.8 1.4 - 7.0 x10E3/uL   Lymphocytes Absolute 1.6 0.7 - 3.1 x10E3/uL   Monocytes Absolute 0.3 0.1 - 0.9 x10E3/uL   EOS (ABSOLUTE) 0.2 0.0 - 0.4 x10E3/uL   Basophils Absolute 0.0 0.0 - 0.2 x10E3/uL   Immature Granulocytes 0 Not Estab. %   Immature Grans (Abs) 0.0 0.0 - 0.1 x10E3/uL  Comprehensive metabolic panel  Result Value Ref Range   Glucose 94 70 - 99 mg/dL   BUN 13 8 -  27 mg/dL   Creatinine, Ser 3.66 0.57 - 1.00 mg/dL   eGFR 73 >44 IH/KVQ/2.59   BUN/Creatinine Ratio 15 12 - 28   Sodium 141 134 - 144 mmol/L   Potassium 4.5 3.5 - 5.2 mmol/L   Chloride 103 96 - 106 mmol/L   CO2 21 20 - 29 mmol/L   Calcium 9.3 8.7 - 10.3 mg/dL   Total Protein 6.7 6.0 - 8.5 g/dL   Albumin 4.0 3.9 - 4.9 g/dL   Globulin, Total 2.7 1.5 - 4.5 g/dL   Albumin/Globulin Ratio 1.5 1.2 - 2.2   Bilirubin Total 0.3 0.0 - 1.2 mg/dL   Alkaline Phosphatase 80 44 - 121 IU/L   AST 19 0 - 40 IU/L   ALT 14 0 - 32 IU/L  Lipid Panel w/o Chol/HDL Ratio  Result Value Ref Range   Cholesterol, Total 155 100 - 199 mg/dL   Triglycerides 97 0 - 149 mg/dL   HDL 70 >56 mg/dL   VLDL Cholesterol Cal 18 5 - 40 mg/dL   LDL Chol Calc (NIH) 67 0 - 99 mg/dL  Urinalysis, Routine w reflex microscopic  Result Value Ref Range   Specific Gravity, UA 1.015 1.005 - 1.030   pH, UA 7.0 5.0 - 7.5   Color, UA Yellow Yellow   Appearance Ur Clear Clear   Leukocytes,UA Negative Negative   Protein,UA Negative Negative/Trace   Glucose, UA Negative Negative   Ketones, UA Negative Negative   RBC, UA Negative Negative   Bilirubin, UA Negative Negative   Urobilinogen, Ur 0.2 0.2 - 1.0 mg/dL   Nitrite, UA Negative Negative   Microscopic Examination Comment   TSH  Result Value Ref Range   TSH 3.070 0.450 - 4.500 uIU/mL      Assessment & Plan:   Problem List Items Addressed This Visit       Cardiovascular and Mediastinum   Atherosclerosis of aorta (HCC)    Will keep BP and cholesterol under good control. Continue to work on weight loss. Call with any concerns.       Senile purpura (HCC)    Stable. Continue to monitor.         Other   Depression, recurrent (HCC)    Doing OK. Has list of counselors.       Morbid obesity (HCC) -  Primary    Down 13lbs in the first 3 weeks of wegovy. Will continue titration up. Discussed that jumping doses is not recommended due to side effects. She would like to  change to compounded pharmacy due to cost. Rx sent to [facility] today.      Relevant Medications   Semaglutide-Weight Management 1.7 MG/0.75ML SOAJ (Start on 04/05/2023)   Semaglutide-Weight Management 2.4 MG/0.75ML SOAJ (Start on 04/02/2023)   Semaglutide-Weight Management 1 MG/0.5ML SOAJ   Semaglutide-Weight Management (WEGOVY) 0.5 MG/0.5ML SOAJ     Follow up plan: Return December, follow up.    This visit was completed via video visit through MyChart due to the restrictions of the COVID-19 pandemic. All issues as above were discussed and addressed. Physical exam was done as above through visual confirmation on video through MyChart. If it was felt that the patient should be evaluated in the office, they were directed there. The patient verbally consented to this visit. Location of the patient: home Location of the provider: work Those involved with this call:  Provider: Olevia Perches, DO CMA:  Maggie Font, CMA Front Desk/Registration: Servando Snare  Time spent on call:  15 minutes with patient face to face via video conference. More than 50% of this time was spent in counseling and coordination of care. 23 minutes total spent in review of patient's record and preparation of their chart.

## 2023-02-22 NOTE — Assessment & Plan Note (Signed)
Will keep BP and cholesterol under good control. Continue to work on weight loss. Call with any concerns.

## 2023-03-06 DIAGNOSIS — M170 Bilateral primary osteoarthritis of knee: Secondary | ICD-10-CM | POA: Insufficient documentation

## 2023-03-20 ENCOUNTER — Other Ambulatory Visit: Payer: Self-pay | Admitting: Family Medicine

## 2023-03-22 NOTE — Telephone Encounter (Signed)
Requested medications are due for refill today.  yes  Requested medications are on the active medications list.  yes  Last refill. 07/07/2022 #270 1 rf  Future visit scheduled.   yes  Notes to clinic.  Refill not delegated.    Requested Prescriptions  Pending Prescriptions Disp Refills   pregabalin (LYRICA) 75 MG capsule [Pharmacy Med Name: PREGABALIN 75MG  CAPSULES] 270 capsule     Sig: TAKE 1 CAPSULE(75 MG) BY MOUTH THREE TIMES DAILY     Not Delegated - Neurology:  Anticonvulsants - Controlled - pregabalin Failed - 03/20/2023  5:51 PM      Failed - This refill cannot be delegated      Passed - Cr in normal range and within 360 days    Creatinine, Ser  Date Value Ref Range Status  07/07/2022 0.88 0.57 - 1.00 mg/dL Final         Passed - Completed PHQ-2 or PHQ-9 in the last 360 days      Passed - Valid encounter within last 12 months    Recent Outpatient Visits           4 weeks ago Morbid obesity (HCC)   [place] [facility] [place], Megan P, DO   3 months ago Morbid obesity [facility])   [place] [facility], Megan P, DO   8 months ago COVID-19   [place] Larae Grooms, NP   8 months ago Welcome to [employer] preventive visit   [facility], Megan P, DO   8 months ago Osteoarthritis, unspecified osteoarthritis type, unspecified site   [facility] Dorcas Carrow, DO       Future Appointments             In 3 months Laural Benes, Oralia Rud, DO [place] [facility]

## 2023-04-19 ENCOUNTER — Encounter: Payer: Self-pay | Admitting: Family Medicine

## 2023-04-19 NOTE — Telephone Encounter (Signed)
Appt please

## 2023-04-20 ENCOUNTER — Telehealth: Payer: Medicare Other | Admitting: Pediatrics
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
## 2023-04-23 ENCOUNTER — Telehealth (INDEPENDENT_AMBULATORY_CARE_PROVIDER_SITE_OTHER): Payer: Medicare Other | Admitting: Family Medicine

## 2023-04-23 ENCOUNTER — Encounter: Payer: Self-pay | Admitting: Family Medicine

## 2023-04-23 ENCOUNTER — Ambulatory Visit: Payer: Self-pay | Admitting: *Deleted

## 2023-04-23 DIAGNOSIS — J0100 Acute maxillary sinusitis, unspecified: Secondary | ICD-10-CM

## 2023-04-23 MED ORDER — AMOXICILLIN-POT CLAVULANATE 875-125 MG PO TABS: 1.0000 | ORAL_TABLET | Freq: Two times a day (BID) | ORAL | 0 refills | Status: DC
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
## 2023-04-23 NOTE — Telephone Encounter (Signed)
Attempted to reach patient, LVM to call office back to schedule an in person appointment.  Put in CRM.

## 2023-04-23 NOTE — Telephone Encounter (Signed)
Discussed pt call with Mary Hardin. Mary Hardin stated will ask PCP.
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
## 2023-05-04 ENCOUNTER — Telehealth: Payer: Medicare Other | Admitting: Family Medicine

## 2023-05-04 ENCOUNTER — Encounter: Payer: Self-pay | Admitting: Family Medicine

## 2023-05-04 DIAGNOSIS — R0981 Nasal congestion: Secondary | ICD-10-CM | POA: Diagnosis not present

## 2023-05-04 MED ORDER — PREDNISONE 50 MG PO TABS: 50.0000 mg | ORAL_TABLET | Freq: Every day | ORAL | 0 refills | Status: DC

## 2023-05-04 NOTE — Progress Notes (Signed)
There were no vitals taken for this visit.   Subjective:    Patient ID: Mary Hardin, female    DOB: Apr 13, 1956, 67 y.o.   MRN: [id number]  HPI: Mary Hardin is a 67 y.o. female  Chief Complaint  Patient presents with   Cough   Head Congestion    Patient says she since completed Amoxicillin for 10-day treatment. Patient says she thinks that she may need another treatment option. Patient says she been using Mucinex 12-HR. Patient says her mucus has a discoloration to it.    UPPER RESPIRATORY TRACT INFECTION Duration: about 17 days Worst symptom: nasal congestion and drainage Fever: no Cough: yes Shortness of breath: no Wheezing: no Chest pain: no Chest tightness: no Chest congestion: no Nasal congestion: yes Runny nose: yes Post nasal drip: yes Sneezing: no Sore throat: no Swollen glands: no Sinus pressure: yes Headache: yes Face pain: no Toothache: no Ear pain: no  Ear pressure: no  Eyes red/itching:no Eye drainage/crusting: yes  Vomiting: no Rash: no Fatigue: yes Sick contacts: no Strep contacts: no  Context: better Recurrent sinusitis: no Relief with OTC cold/cough medications: no  Treatments attempted: mucinex, flonase, augmentin   Relevant past medical, surgical, family and social history reviewed and updated as indicated. Interim medical history since our last visit reviewed. Allergies and medications reviewed and updated.  Review of Systems  Constitutional: Negative.   HENT:  Positive for congestion, postnasal drip, rhinorrhea, sinus pressure and sore throat. Negative for dental problem, drooling, ear discharge, ear pain, facial swelling, hearing loss, mouth sores, nosebleeds, sinus pain, sneezing, tinnitus, trouble swallowing and voice change.   Eyes: Negative.   Respiratory:  Positive for cough. Negative for apnea, choking, chest tightness, shortness of breath, wheezing and stridor.   Cardiovascular: Negative.   Musculoskeletal: Negative.    Psychiatric/Behavioral: Negative.      Per HPI unless specifically indicated above     Objective:    There were no vitals taken for this visit.  Wt Readings from Last 3 Encounters:  02/22/23 (!) 367 lb (166.5 kg)  07/07/22 (!) 379 lb (171.9 kg)  04/28/22 (!) 367 lb 12.8 oz (166.8 kg)    Physical Exam Vitals and nursing note reviewed.  Constitutional:      General: She is not in acute distress.    Appearance: Normal appearance. She is obese. She is not ill-appearing, toxic-appearing or diaphoretic.  HENT:     Head: Normocephalic and atraumatic.     Right Ear: External ear normal.     Left Ear: External ear normal.     Nose: Nose normal.     Mouth/Throat:     Mouth: Mucous membranes are moist.     Pharynx: Oropharynx is clear.  Eyes:     General: No scleral icterus.       Right eye: No discharge.        Left eye: No discharge.     Conjunctiva/sclera: Conjunctivae normal.     Pupils: Pupils are equal, round, and reactive to light.  Pulmonary:     Effort: Pulmonary effort is normal. No respiratory distress.     Comments: Speaking in full sentences Musculoskeletal:        General: Normal range of motion.     Cervical back: Normal range of motion.  Skin:    Coloration: Skin is not jaundiced or pale.     Findings: No bruising, erythema, lesion or rash.  Neurological:     Mental Status: She is alert  and oriented to person, place, and time. Mental status is at baseline.  Psychiatric:        Mood and Affect: Mood normal.        Behavior: Behavior normal.        Thought Content: Thought content normal.        Judgment: Judgment normal.     Results for orders placed or performed in visit on 07/07/22  Microalbumin, Urine Waived  Result Value Ref Range   Microalb, Ur Waived 10 0 - 19 mg/L   Creatinine, Urine Waived 10 10 - 300 mg/dL   Microalb/Creat Ratio 30-300 (H) <30 mg/g  CBC with Differential/Platelet  Result Value Ref Range   WBC 6.0 3.4 - 10.8 x10E3/uL   RBC  4.03 3.77 - 5.28 x10E6/uL   Hemoglobin 11.2 11.1 - 15.9 g/dL   Hematocrit 56.2 13.0 - 46.6 %   MCV 85 79 - 97 fL   MCH 27.8 26.6 - 33.0 pg   MCHC 32.7 31.5 - 35.7 g/dL   RDW 86.5 78.4 - 69.6 %   Platelets 252 150 - 450 x10E3/uL   Neutrophils 62 Not Estab. %   Lymphs 27 Not Estab. %   Monocytes 6 Not Estab. %   Eos 4 Not Estab. %   Basos 1 Not Estab. %   Neutrophils Absolute 3.8 1.4 - 7.0 x10E3/uL   Lymphocytes Absolute 1.6 0.7 - 3.1 x10E3/uL   Monocytes Absolute 0.3 0.1 - 0.9 x10E3/uL   EOS (ABSOLUTE) 0.2 0.0 - 0.4 x10E3/uL   Basophils Absolute 0.0 0.0 - 0.2 x10E3/uL   Immature Granulocytes 0 Not Estab. %   Immature Grans (Abs) 0.0 0.0 - 0.1 x10E3/uL  Comprehensive metabolic panel  Result Value Ref Range   Glucose 94 70 - 99 mg/dL   BUN 13 8 - 27 mg/dL   Creatinine, Ser 2.95 0.57 - 1.00 mg/dL   eGFR 73 >28 UX/LKG/4.01   BUN/Creatinine Ratio 15 12 - 28   Sodium 141 134 - 144 mmol/L   Potassium 4.5 3.5 - 5.2 mmol/L   Chloride 103 96 - 106 mmol/L   CO2 21 20 - 29 mmol/L   Calcium 9.3 8.7 - 10.3 mg/dL   Total Protein 6.7 6.0 - 8.5 g/dL   Albumin 4.0 3.9 - 4.9 g/dL   Globulin, Total 2.7 1.5 - 4.5 g/dL   Albumin/Globulin Ratio 1.5 1.2 - 2.2   Bilirubin Total 0.3 0.0 - 1.2 mg/dL   Alkaline Phosphatase 80 44 - 121 IU/L   AST 19 0 - 40 IU/L   ALT 14 0 - 32 IU/L  Lipid Panel w/o Chol/HDL Ratio  Result Value Ref Range   Cholesterol, Total 155 100 - 199 mg/dL   Triglycerides 97 0 - 149 mg/dL   HDL 70 >02 mg/dL   VLDL Cholesterol Cal 18 5 - 40 mg/dL   LDL Chol Calc (NIH) 67 0 - 99 mg/dL  Urinalysis, Routine w reflex microscopic  Result Value Ref Range   Specific Gravity, UA 1.015 1.005 - 1.030   pH, UA 7.0 5.0 - 7.5   Color, UA Yellow Yellow   Appearance Ur Clear Clear   Leukocytes,UA Negative Negative   Protein,UA Negative Negative/Trace   Glucose, UA Negative Negative   Ketones, UA Negative Negative   RBC, UA Negative Negative   Bilirubin, UA Negative Negative    Urobilinogen, Ur 0.2 0.2 - 1.0 mg/dL   Nitrite, UA Negative Negative   Microscopic Examination Comment   TSH  Result  Value Ref Range   TSH 3.070 0.450 - 4.500 uIU/mL      Assessment & Plan:   Problem List Items Addressed This Visit   None Visit Diagnoses     Nasal congestion    -  Primary   Will treat with prednisone. Call with any concerns. Continue to monitor. Will start antihistamine.        Follow up plan: Return if symptoms worsen or fail to improve.    This visit was completed via video visit through MyChart due to the restrictions of the COVID-19 pandemic. All issues as above were discussed and addressed. Physical exam was done as above through visual confirmation on video through MyChart. If it was felt that the patient should be evaluated in the office, they were directed there. The patient verbally consented to this visit. Location of the patient: home Location of the provider: work Those involved with this call:  Provider: Olevia Perches, DO CMA: Malen Gauze, CMA Front Desk/Registration:  Servando Snare   Time spent on call:  15 minutes with patient face to face via video conference. More than 50% of this time was spent in counseling and coordination of care. 23 minutes total spent in review of patient's record and preparation of their chart.

## 2023-07-01 ENCOUNTER — Other Ambulatory Visit: Payer: Self-pay | Admitting: Family Medicine

## 2023-07-01 DIAGNOSIS — F339 Major depressive disorder, recurrent, unspecified: Secondary | ICD-10-CM

## 2023-07-03 NOTE — Telephone Encounter (Signed)
Requested Prescriptions  Pending Prescriptions Disp Refills   escitalopram (LEXAPRO) 20 MG tablet [Pharmacy Med Name: ESCITALOPRAM 20MG  TABLETS] 90 tablet 0    Sig: TAKE 1 TABLET(20 MG) BY MOUTH DAILY     Psychiatry:  Antidepressants - SSRI Passed - 07/01/2023  8:04 AM      Passed - Completed PHQ-2 or PHQ-9 in the last 360 days      Passed - Valid encounter within last 6 months    Recent Outpatient Visits           2 months ago Nasal congestion   [place] [facility] [place], Megan P, DO   2 months ago Acute non-recurrent maxillary sinusitis   [place] [facility] [place], Megan P, DO   4 months ago Morbid obesity [facility])   [place] [facility] [place], Megan P, DO   6 months ago Morbid obesity (HCC)   [place] [facility] [place], Megan P, DO   11 months ago COVID-19   [facility] Larae Grooms, NP       Future Appointments             In 1 week Laural Benes, Oralia Rud, DO [place] [facility]             losartan (COZAAR) 25 MG tablet [Pharmacy Med Name: LOSARTAN 25MG  TABLETS] 90 tablet 0    Sig: TAKE 1 TABLET(25 MG) BY MOUTH DAILY     Cardiovascular:  Angiotensin Receptor Blockers Failed - 07/01/2023  8:04 AM      Failed - Cr in normal range and within 180 days    Creatinine, Ser  Date Value Ref Range Status  07/07/2022 0.88 0.57 - 1.00 mg/dL Final         Failed - K in normal range and within 180 days    Potassium  Date Value Ref Range Status  07/07/2022 4.5 3.5 - 5.2 mmol/L Final         Passed - Patient is not pregnant      Passed - Last BP in normal range    BP Readings from Last 1 Encounters:  07/07/22 130/88         Passed - Valid encounter within last 6 months    Recent Outpatient Visits           2 months ago Nasal congestion   [place] [facility] [place], [place] P, DO   2 months ago Acute non-recurrent maxillary sinusitis    [place] [facility] [place], Megan P, DO   4 months ago Morbid obesity [facility])   [place] [facility] [place], Megan P, DO   6 months ago Morbid obesity [facility])   [place] [facility] [place], Megan P, DO   11 months ago COVID-19   [place] [facility] Larae Grooms, NP       Future Appointments             In 1 week Laural Benes, Oralia Rud, DO [place] [facility]             meloxicam (MOBIC) 15 MG tablet [Pharmacy Med Name: MELOXICAM 15MG  TABLETS] 90 tablet 0    Sig: TAKE 1 TABLET(15 MG) BY MOUTH DAILY     Analgesics:  COX2 Inhibitors Failed - 07/01/2023  8:04 AM      Failed - Manual Review: Labs are only required if the patient  has taken medication for more than 8 weeks.      Passed - HGB in normal range and within 360 days    Hemoglobin  Date Value Ref Range Status  07/07/2022 11.2 11.1 - 15.9 g/dL Final         Passed - Cr in normal range and within 360 days    Creatinine, Ser  Date Value Ref Range Status  07/07/2022 0.88 0.57 - 1.00 mg/dL Final         Passed - HCT in normal range and within 360 days    Hematocrit  Date Value Ref Range Status  07/07/2022 34.2 34.0 - 46.6 % Final         Passed - AST in normal range and within 360 days    AST  Date Value Ref Range Status  07/07/2022 19 0 - 40 IU/L Final         Passed - ALT in normal range and within 360 days    ALT  Date Value Ref Range Status  07/07/2022 14 0 - 32 IU/L Final         Passed - eGFR is 30 or above and within 360 days    GFR calc Af Amer  Date Value Ref Range Status  06/24/2020 105 >59 mL/min/1.73 Final    Comment:    **In accordance with recommendations from the NKF-ASN Task force,**   [facility] is in the process of updating its eGFR calculation to the   2021 CKD-EPI creatinine equation that estimates kidney function   without a race variable.    GFR calc non Af Amer  Date Value Ref Range Status   06/24/2020 91 >59 mL/min/1.73 Final   eGFR  Date Value Ref Range Status  07/07/2022 73 >59 mL/min/1.73 Final         Passed - Patient is not pregnant      Passed - Valid encounter within last 12 months    Recent Outpatient Visits           2 months ago Nasal congestion   [place] [facility] [place], Megan P, DO   2 months ago Acute non-recurrent maxillary sinusitis   [place] [facility] [place], Megan P, DO   4 months ago Morbid obesity [facility])   [place] [facility] [place], Megan P, DO   6 months ago Morbid obesity [facility])   [place] [facility] [place], Megan P, DO   11 months ago COVID-19   [place] [facility] Larae Grooms, NP       Future Appointments             In 1 week Laural Benes, Oralia Rud, DO [place] [facility]

## 2023-07-12 ENCOUNTER — Ambulatory Visit: Payer: Medicare Other | Admitting: Family Medicine

## 2023-07-12 LAB — HM MAMMOGRAPHY

## 2023-07-13 ENCOUNTER — Encounter: Payer: Self-pay | Admitting: Family Medicine

## 2023-07-13 ENCOUNTER — Ambulatory Visit (INDEPENDENT_AMBULATORY_CARE_PROVIDER_SITE_OTHER): Payer: Medicare Other | Admitting: Family Medicine

## 2023-07-13 VITALS — BP 117/72 | HR 69 | Wt 355.0 lb

## 2023-07-13 DIAGNOSIS — F419 Anxiety disorder, unspecified: Secondary | ICD-10-CM

## 2023-07-13 DIAGNOSIS — F339 Major depressive disorder, recurrent, unspecified: Secondary | ICD-10-CM

## 2023-07-13 DIAGNOSIS — Z0000 Encounter for general adult medical examination without abnormal findings: Secondary | ICD-10-CM

## 2023-07-13 DIAGNOSIS — Z136 Encounter for screening for cardiovascular disorders: Secondary | ICD-10-CM

## 2023-07-13 DIAGNOSIS — D692 Other nonthrombocytopenic purpura: Secondary | ICD-10-CM

## 2023-07-13 DIAGNOSIS — Z6841 Body Mass Index (BMI) 40.0 and over, adult: Secondary | ICD-10-CM

## 2023-07-13 DIAGNOSIS — Z23 Encounter for immunization: Secondary | ICD-10-CM

## 2023-07-13 DIAGNOSIS — I10 Essential (primary) hypertension: Secondary | ICD-10-CM | POA: Diagnosis not present

## 2023-07-13 LAB — MICROALBUMIN, URINE WAIVED
Creatinine, Urine Waived: 300 mg/dL (ref 10–300)
Microalb, Ur Waived: 80 mg/L — ABNORMAL HIGH (ref 0–19)

## 2023-07-13 LAB — BAYER DCA HB A1C WAIVED: HB A1C (BAYER DCA - WAIVED): 5.4 % (ref 4.8–5.6)

## 2023-07-13 MED ORDER — PREGABALIN 75 MG PO CAPS: 75.0000 mg | ORAL_CAPSULE | Freq: Three times a day (TID) | ORAL | 1 refills | Status: DC

## 2023-07-13 MED ORDER — LOSARTAN POTASSIUM 25 MG PO TABS: 25.0000 mg | ORAL_TABLET | Freq: Every day | ORAL | 1 refills | Status: DC

## 2023-07-13 MED ORDER — ESOMEPRAZOLE MAGNESIUM 40 MG PO CPDR: DELAYED_RELEASE_CAPSULE | ORAL | 1 refills | Status: DC

## 2023-07-13 MED ORDER — ESCITALOPRAM OXALATE 20 MG PO TABS: 20.0000 mg | ORAL_TABLET | Freq: Every day | ORAL | 1 refills | Status: DC

## 2023-07-13 MED ORDER — MELOXICAM 15 MG PO TABS: 15.0000 mg | ORAL_TABLET | Freq: Every day | ORAL | 1 refills | Status: DC

## 2023-07-13 MED ORDER — WEGOVY 2.4 MG/0.75ML ~~LOC~~ SOAJ: 2.4000 mg | SUBCUTANEOUS | 1 refills | Status: DC

## 2023-07-13 MED ORDER — LORAZEPAM 1 MG PO TABS: 1.0000 mg | ORAL_TABLET | Freq: Every day | ORAL | 0 refills | Status: AC | PRN

## 2023-07-13 NOTE — Assessment & Plan Note (Signed)
Under good control on current regimen. Continue current regimen. Continue to monitor. Call with any concerns. Refills given. Labs drawn today.   

## 2023-07-13 NOTE — Assessment & Plan Note (Addendum)
Congratulated patient on 24lb weight loss! Continue compounded wegovy. Rx sent to her pharmacy today.

## 2023-07-13 NOTE — Patient Instructions (Signed)
Preventative Services:  Health Risk Assessment and Personalized Prevention Plan: Done today Bone Mass Measurements: Will check  Breast Cancer Screening: Up to date CVD Screening: up to date Cervical Cancer Screening: N/A Colon Cancer Screening: up to date Depression Screening: done today Diabetes Screening: done today Glaucoma Screening: See eye doctor Hepatitis B vaccine: N/A Hepatitis C screening: up to date HIV Screening: up to date Flu Vaccine: given today Lung cancer Screening: N/A Obesity Screening: done today Pneumonia Vaccines (2): up to date STI Screening: N/A

## 2023-07-13 NOTE — Progress Notes (Signed)
BP 117/72   Pulse 69   Wt (!) 355 lb (161 kg)   SpO2 99%   BMI 53.98 kg/m    Subjective:    Patient ID: Mary Hardin, female    DOB: [birth year], 67 y.o.   MRN: [id number]  HPI: Mary Hardin is a 67 y.o. female presenting on 07/13/2023 for comprehensive medical examination. Current medical complaints include:  HYPERTENSION  Hypertension status: controlled  Satisfied with current treatment? yes Duration of hypertension: chronic BP monitoring frequency:  not checking BP medication side effects:  no Medication compliance: excellent compliance Previous BP meds: losartan Aspirin: no Recurrent headaches: no Visual changes: no Palpitations: no Dyspnea: no Chest pain: no Lower extremity edema: no Dizzy/lightheaded: no  DEPRESSION Mood status: controlled Satisfied with current treatment?: yes Symptom severity: mild  Duration of current treatment : chronic Side effects: no Medication compliance: excellent compliance Psychotherapy/counseling: no  Previous psychiatric medications: lexapro, klonopin Depressed mood: no Anxious mood: no Anhedonia: no Significant weight loss or gain: yes Insomnia: no  Fatigue: yes Feelings of worthlessness or guilt: no Impaired concentration/indecisiveness: no Suicidal ideations: no Hopelessness: no Crying spells: no    07/13/2023    9:54 AM 02/22/2023   10:41 AM 12/07/2022    4:17 PM 07/07/2022    1:56 PM 04/28/2022    2:23 PM  Depression screen PHQ 2/9  Decreased Interest 0 0 2 0 0  Down, Depressed, Hopeless 0 0 0 0 0  PHQ - 2 Score 0 0 2 0 0  Altered sleeping 0 0 0 0 0  Tired, decreased energy 0 0 3 0 3  Change in appetite 0 0 3 3 3   Feeling bad or failure about yourself  0 0 3 0 0  Trouble concentrating 0 0 3 0 0  Moving slowly or fidgety/restless 0 0 0 0 0  Suicidal thoughts 0 0 0 0 0  PHQ-9 Score 0 0 14 3 6   Difficult doing work/chores Not difficult at all Not difficult at all Not difficult at all Not difficult at all  Somewhat difficult    She currently lives with: Mom Menopausal Symptoms: no  Functional Status Survey: Is the patient deaf or have difficulty hearing?: No Does the patient have difficulty seeing, even when wearing glasses/contacts?: No Does the patient have difficulty concentrating, remembering, or making decisions?: No Does the patient have difficulty walking or climbing stairs?: Yes Does the patient have difficulty dressing or bathing?: No Does the patient have difficulty doing errands alone such as visiting a doctor's office or shopping?: No     07/13/2023    9:54 AM 02/22/2023   10:40 AM 07/07/2022    1:48 PM 04/28/2022    2:24 PM 06/09/2021   10:48 AM  Fall Risk   Falls in the past year? 0 0 1 1 0  Number falls in past yr: 0 0 0 0 0  Injury with Fall? 0 0 0 1 0  Risk for fall due to : No Fall Risks No Fall Risks History of fall(s) History of fall(s);Impaired balance/gait No Fall Risks  Follow up Falls evaluation completed Falls evaluation completed Falls evaluation completed Falls evaluation completed Falls evaluation completed    Depression Screen    07/13/2023    9:54 AM 02/22/2023   10:41 AM 12/07/2022    4:17 PM 07/07/2022    1:56 PM 04/28/2022    2:23 PM  Depression screen PHQ 2/9  Decreased Interest 0 0 2 0 0  Down, Depressed,  Hopeless 0 0 0 0 0  PHQ - 2 Score 0 0 2 0 0  Altered sleeping 0 0 0 0 0  Tired, decreased energy 0 0 3 0 3  Change in appetite 0 0 3 3 3   Feeling bad or failure about yourself  0 0 3 0 0  Trouble concentrating 0 0 3 0 0  Moving slowly or fidgety/restless 0 0 0 0 0  Suicidal thoughts 0 0 0 0 0  PHQ-9 Score 0 0 14 3 6   Difficult doing work/chores Not difficult at all Not difficult at all Not difficult at all Not difficult at all Somewhat difficult     Advanced Directives Does patient have a HCPOA?    no If yes, name and contact information:  Does patient have a living will or MOST form?  no  Past Medical History:  Past Medical  History:  Diagnosis Date   Anemia    Anxiety    Arthritis    Asthma    Depression    Family history of adverse reaction to anesthesia    Pt mother has PONV   Family history of malignant neoplasm of breast    Fatty liver    GERD (gastroesophageal reflux disease)    Headache    Hernia of abdominal wall    Hypertension    PONV (postoperative nausea and vomiting)    Sleep apnea    wears cpap    Surgical History:  Past Surgical History:  Procedure Laterality Date   CHOLECYSTECTOMY     COLONOSCOPY N/A 08/31/2017   Procedure: COLONOSCOPY;  Surgeon: Graylin Shiver, MD;  Location: [facility] ENDOSCOPY;  Service: Endoscopy;  Laterality: N/A;   COLONOSCOPY W/ BIOPSIES AND POLYPECTOMY     DILATION AND CURETTAGE OF UTERUS     x2   ESOPHAGOGASTRODUODENOSCOPY N/A 08/31/2017   Procedure: ESOPHAGOGASTRODUODENOSCOPY (EGD);  Surgeon: Graylin Shiver, MD;  Location: [facility] ENDOSCOPY;  Service: Endoscopy;  Laterality: N/A;   EYE SURGERY     lasik   VARICOSE VEIN SURGERY     wisdom teeth extraction      Medications:  Current Outpatient Medications on File Prior to Visit  Medication Sig   Cholecalciferol (VITAMIN D3) 5000 units CAPS Take 5,000 Units by mouth daily.   docusate sodium (COLACE) 100 MG capsule Take 100 mg by mouth daily.   EPINEPHrine (EPIPEN 2-PAK) 0.3 mg/0.3 mL IJ SOAJ injection Inject 0.3 mg into the muscle as needed for anaphylaxis.   valACYclovir (VALTREX) 1000 MG tablet Take 1 tablet every 12 hours by oral route as needed.   mupirocin ointment (BACTROBAN) 2 % APPLY TO AFFECTED AREAS 3 TIMES DAILY (Patient not taking: Reported on 07/13/2023)   No current facility-administered medications on file prior to visit.    Allergies:  Allergies  Allergen Reactions   Shellfish Allergy     swelling   Shellfish-Derived Products     swelling    Social History:  Social History   Socioeconomic History   Marital status: Widowed    Spouse name: Not on file   Number of children: 0   Years of  education: Not on file   Highest education level: Not on file  Occupational History   Not on file  Tobacco Use   Smoking status: Never   Smokeless tobacco: Never  Vaping Use   Vaping status: Never Used  Substance and Sexual Activity   Alcohol use: Yes    Comment: 1-4 glasses of wine per week  Drug use: No   Sexual activity: Not Currently  Other Topics Concern   Not on file  Social History Narrative   Not on file   Social Drivers of Health   Financial Resource Strain: Not on file  Food Insecurity: Not on file  Transportation Needs: Not on file  Physical Activity: Not on file  Stress: Not on file  Social Connections: Not on file  Intimate Partner Violence: Not on file   Social History   Tobacco Use  Smoking Status Never  Smokeless Tobacco Never   Social History   Substance and Sexual Activity  Alcohol Use Yes   Comment: 1-4 glasses of wine per week    Family History:  Family History  Problem Relation Age of Onset   Breast cancer Mother 87       DCIS; currently 75   Hypertension Mother    Arrhythmia Mother        A-fib   Stroke Father    Hyperlipidemia Father    Seizures Father    Cancer Brother    Breast cancer Maternal Grandmother 93       deceased 46   Diabetes Maternal Grandmother    Pancreatic cancer Paternal Grandmother 73   Breast cancer Maternal Aunt 79       currently 71   Breast cancer Paternal Aunt    Thyroid cancer Cousin        dx in her 42s    Past medical history, surgical history, medications, allergies, family history and social history reviewed with patient today and changes made to appropriate areas of the chart.   Review of Systems  Constitutional: Negative.   HENT: Negative.    Eyes: Negative.   Respiratory:  Negative for cough, hemoptysis, sputum production, shortness of breath and wheezing.   Cardiovascular:  Positive for chest pain. Negative for palpitations, orthopnea, claudication, leg swelling and PND.   Gastrointestinal:  Positive for constipation. Negative for abdominal pain, blood in stool, diarrhea, heartburn, melena, nausea and vomiting.  Genitourinary: Negative.   Musculoskeletal:  Positive for neck pain. Negative for back pain, falls, joint pain and myalgias.  Skin: Negative.   Neurological: Negative.   Endo/Heme/Allergies:  Positive for environmental allergies and polydipsia. Bruises/bleeds easily.  Psychiatric/Behavioral: Negative.         Irritability    All other ROS negative except what is listed above and in the HPI.      Objective:    BP 117/72   Pulse 69   Wt (!) 355 lb (161 kg)   SpO2 99%   BMI 53.98 kg/m   Wt Readings from Last 3 Encounters:  07/13/23 (!) 355 lb (161 kg)  02/22/23 (!) 367 lb (166.5 kg)  07/07/22 (!) 379 lb (171.9 kg)     Physical Exam Vitals and nursing note reviewed.  Constitutional:      General: She is not in acute distress.    Appearance: Normal appearance. She is obese. She is not ill-appearing, toxic-appearing or diaphoretic.  HENT:     Head: Normocephalic and atraumatic.     Right Ear: Tympanic membrane, ear canal and external ear normal. There is no impacted cerumen.     Left Ear: Tympanic membrane, ear canal and external ear normal. There is no impacted cerumen.     Nose: Nose normal. No congestion or rhinorrhea.     Mouth/Throat:     Mouth: Mucous membranes are moist.     Pharynx: Oropharynx is clear. No oropharyngeal exudate or posterior  oropharyngeal erythema.  Eyes:     General: No scleral icterus.       Right eye: No discharge.        Left eye: No discharge.     Extraocular Movements: Extraocular movements intact.     Conjunctiva/sclera: Conjunctivae normal.     Pupils: Pupils are equal, round, and reactive to light.  Neck:     Vascular: No carotid bruit.  Cardiovascular:     Rate and Rhythm: Normal rate and regular rhythm.     Pulses: Normal pulses.     Heart sounds: No murmur heard.    No friction rub. No gallop.   Pulmonary:     Effort: Pulmonary effort is normal. No respiratory distress.     Breath sounds: Normal breath sounds. No stridor. No wheezing, rhonchi or rales.  Chest:     Chest wall: No tenderness.  Abdominal:     General: Abdomen is flat. Bowel sounds are normal. There is no distension.     Palpations: Abdomen is soft. There is no mass.     Tenderness: There is no abdominal tenderness. There is no right CVA tenderness, left CVA tenderness, guarding or rebound.     Hernia: No hernia is present.  Genitourinary:    Comments: Breast and pelvic exams deferred with shared decision making Musculoskeletal:        General: No swelling, tenderness, deformity or signs of injury.     Cervical back: Normal range of motion and neck supple. No rigidity. No muscular tenderness.     Right lower leg: No edema.     Left lower leg: No edema.  Lymphadenopathy:     Cervical: No cervical adenopathy.  Skin:    General: Skin is warm and dry.     Capillary Refill: Capillary refill takes less than 2 seconds.     Coloration: Skin is not jaundiced or pale.     Findings: No bruising, erythema, lesion or rash.  Neurological:     General: No focal deficit present.     Mental Status: She is alert and oriented to person, place, and time. Mental status is at baseline.     Cranial Nerves: No cranial nerve deficit.     Sensory: No sensory deficit.     Motor: No weakness.     Coordination: Coordination normal.     Gait: Gait normal.     Deep Tendon Reflexes: Reflexes normal.  Psychiatric:        Mood and Affect: Mood normal.        Behavior: Behavior normal.        Thought Content: Thought content normal.        Judgment: Judgment normal.        07/13/2023   10:21 AM 07/07/2022    1:57 PM 07/07/2022    1:56 PM  6CIT Screen  What Year? 0 points 0 points 0 points  What month? 0 points 0 points 0 points  What time? 0 points 0 points 0 points  Count back from 20 0 points 0 points 0 points  Months in  reverse 0 points 0 points 0 points  Repeat phrase 4 points 4 points 4 points  Total Score 4 points 4 points 4 points    Results for orders placed or performed in visit on 07/13/23  Microalbumin, Urine Waived   Collection Time: 07/13/23  9:57 AM  Result Value Ref Range   Microalb, Ur Waived 80 (H) 0 - 19 mg/L   Creatinine,  Urine Waived 300 10 - 300 mg/dL   Microalb/Creat Ratio 30-300 (H) <30 mg/g  Bayer DCA Hb A1c Waived   Collection Time: 07/13/23  9:57 AM  Result Value Ref Range   HB A1C (BAYER DCA - WAIVED) 5.4 4.8 - 5.6 %      Assessment & Plan:   Problem List Items Addressed This Visit       Cardiovascular and Mediastinum   HTN (hypertension)   Under good control on current regimen. Continue current regimen. Continue to monitor. Call with any concerns. Refills given. Labs drawn today.       Relevant Medications   losartan (COZAAR) 25 MG tablet   Other Relevant Orders   CBC with Differential/Platelet   Comprehensive metabolic panel   TSH   Microalbumin, Urine Waived (Completed)   Senile purpura (HCC)   Reassured patient. Continue to monitor.       Relevant Medications   losartan (COZAAR) 25 MG tablet   Other Relevant Orders   CBC with Differential/Platelet   Comprehensive metabolic panel     Other   Depression, recurrent (HCC)   Under good control on current regimen. Continue current regimen. Continue to monitor. Call with any concerns. Refills given. Labs drawn today.       Relevant Medications   escitalopram (LEXAPRO) 20 MG tablet   LORazepam (ATIVAN) 1 MG tablet   Other Relevant Orders   CBC with Differential/Platelet   Comprehensive metabolic panel   Anxiety   Under good control on current regimen. Continue current regimen. Continue to monitor. Call with any concerns. Refills given. Labs drawn today.        Relevant Medications   escitalopram (LEXAPRO) 20 MG tablet   LORazepam (ATIVAN) 1 MG tablet   Morbid obesity (HCC)   Congratulated patient  on 24lb weight loss! Continue compounded wegovy. Rx sent to her pharmacy today.      Relevant Medications   Semaglutide-Weight Management (WEGOVY) 2.4 MG/0.75ML SOAJ   Other Relevant Orders   CBC with Differential/Platelet   Comprehensive metabolic panel   Bayer DCA Hb Z6X Waived (Completed)   Other Visit Diagnoses       Encounter for annual wellness exam in Medicare patient    -  Primary   Preventative care discussed today as below.     Screening for cardiovascular condition       Lipids drawn today. Await results.   Relevant Orders   Lipid Panel w/o Chol/HDL Ratio     Needs flu shot       Flu shot given today.   Relevant Orders   Flu Vaccine Trivalent High Dose (Fluad)        Preventative Services:  Health Risk Assessment and Personalized Prevention Plan: Done today Bone Mass Measurements: Will check  Breast Cancer Screening: Up to date CVD Screening: up to date Cervical Cancer Screening: N/A Colon Cancer Screening: up to date Depression Screening: done today Diabetes Screening: done today Glaucoma Screening: See eye doctor Hepatitis B vaccine: N/A Hepatitis C screening: up to date HIV Screening: up to date Flu Vaccine: given today Lung cancer Screening: N/A Obesity Screening: done today Pneumonia Vaccines (2): up to date STI Screening: N/A  Follow up plan: Return in about 6 months (around 01/11/2024) for virtual [place], records release for [facility].   LABORATORY TESTING:  - Pap smear: not applicable  IMMUNIZATIONS:   - Tdap: Tetanus vaccination status reviewed: last tetanus booster within 10 years. - Influenza: Administered today - Pneumovax: Up  to date - Prevnar: Up to date - Zostavax vaccine: Up to date  SCREENING: -Mammogram: Up to date  - Colonoscopy: Up to date  - Bone Density:  Will check on records    PATIENT COUNSELING:   Advised to take 1 mg of folate supplement per day if capable of pregnancy.   Sexuality: Discussed sexually  transmitted diseases, partner selection, use of condoms, avoidance of unintended pregnancy  and contraceptive alternatives.   Advised to avoid cigarette smoking.  I discussed with the patient that most people either abstain from alcohol or drink within safe limits (<=14/week and <=4 drinks/occasion for males, <=7/weeks and <= 3 drinks/occasion for females) and that the risk for alcohol disorders and other health effects rises proportionally with the number of drinks per week and how often a drinker exceeds daily limits.  Discussed cessation/primary prevention of drug use and availability of treatment for abuse.   Diet: Encouraged to adjust caloric intake to maintain  or achieve ideal body weight, to reduce intake of dietary saturated fat and total fat, to limit sodium intake by avoiding high sodium foods and not adding table salt, and to maintain adequate dietary potassium and calcium preferably from fresh fruits, vegetables, and low-fat dairy products.    stressed the importance of regular exercise  Injury prevention: Discussed safety belts, safety helmets, smoke detector, smoking near bedding or upholstery.   Dental health: Discussed importance of regular tooth brushing, flossing, and dental visits.    NEXT PREVENTATIVE PHYSICAL DUE IN 1 YEAR. Return in about 6 months (around 01/11/2024) for virtual [place], records release for [facility].

## 2023-07-13 NOTE — Assessment & Plan Note (Signed)
Reassured patient. Continue to monitor.  

## 2023-07-13 NOTE — Progress Notes (Deleted)
There were no vitals taken for this visit.   Subjective:    Patient ID: Mary Hardin, female    DOB: 18-May-1956, 67 y.o.   MRN: [id number]  HPI: Mary Hardin is a 67 y.o. female presenting on 07/13/2023 for comprehensive medical examination. Current medical complaints include:{Blank single:19197::"none","***"}  She currently lives with: Menopausal Symptoms: {Blank single:19197::"yes","no"}  Depression Screen done today and results listed below:     02/22/2023   10:41 AM 12/07/2022    4:17 PM 07/07/2022    1:56 PM 04/28/2022    2:23 PM 11/30/2021   10:50 AM  Depression screen PHQ 2/9  Decreased Interest 0 2 0 0 0  Down, Depressed, Hopeless 0 0 0 0 0  PHQ - 2 Score 0 2 0 0 0  Altered sleeping 0 0 0 0 1  Tired, decreased energy 0 3 0 3 1  Change in appetite 0 3 3 3  0  Feeling bad or failure about yourself  0 3 0 0 0  Trouble concentrating 0 3 0 0 0  Moving slowly or fidgety/restless 0 0 0 0 0  Suicidal thoughts 0 0 0 0 0  PHQ-9 Score 0 14 3 6 2   Difficult doing work/chores Not difficult at all Not difficult at all Not difficult at all Somewhat difficult Not difficult at all    The patient {has/does not have:19849} a history of falls. I {did/did not:19850} complete a risk assessment for falls. A plan of care for falls {was/was not:19852} documented.   Past Medical History:  Past Medical History:  Diagnosis Date   Anemia    Anxiety    Arthritis    Asthma    Depression    Family history of adverse reaction to anesthesia    Pt mother has PONV   Family history of malignant neoplasm of breast    Fatty liver    GERD (gastroesophageal reflux disease)    Headache    Hernia of abdominal wall    Hypertension    PONV (postoperative nausea and vomiting)    Sleep apnea    wears cpap    Surgical History:  Past Surgical History:  Procedure Laterality Date   CHOLECYSTECTOMY     COLONOSCOPY N/A 08/31/2017   Procedure: COLONOSCOPY;  Surgeon: Graylin Shiver, MD;   Location: [facility] ENDOSCOPY;  Service: Endoscopy;  Laterality: N/A;   COLONOSCOPY W/ BIOPSIES AND POLYPECTOMY     DILATION AND CURETTAGE OF UTERUS     x2   ESOPHAGOGASTRODUODENOSCOPY N/A 08/31/2017   Procedure: ESOPHAGOGASTRODUODENOSCOPY (EGD);  Surgeon: Graylin Shiver, MD;  Location: [facility] ENDOSCOPY;  Service: Endoscopy;  Laterality: N/A;   EYE SURGERY     lasik   VARICOSE VEIN SURGERY     wisdom teeth extraction      Medications:  Current Outpatient Medications on File Prior to Visit  Medication Sig   amoxicillin-clavulanate (AUGMENTIN) 875-125 MG tablet Take 1 tablet by mouth 2 (two) times daily.   Cholecalciferol (VITAMIN D3) 5000 units CAPS Take 5,000 Units by mouth daily.   docusate sodium (COLACE) 100 MG capsule Take 100 mg by mouth daily.   EPINEPHrine (EPIPEN 2-PAK) 0.3 mg/0.3 mL IJ SOAJ injection Inject 0.3 mg into the muscle as needed for anaphylaxis.   escitalopram (LEXAPRO) 20 MG tablet TAKE 1 TABLET(20 MG) BY MOUTH DAILY   esomeprazole (NEXIUM) 40 MG capsule TAKE 1 CAPSULE BY MOUTH EVERY DAY PRN   LORazepam (ATIVAN) 1 MG tablet Take 1 tablet (1 mg total)  by mouth daily as needed for anxiety.   losartan (COZAAR) 25 MG tablet TAKE 1 TABLET(25 MG) BY MOUTH DAILY   meloxicam (MOBIC) 15 MG tablet TAKE 1 TABLET(15 MG) BY MOUTH DAILY   mupirocin ointment (BACTROBAN) 2 % APPLY TO AFFECTED AREAS 3 TIMES DAILY   predniSONE (DELTASONE) 50 MG tablet Take 1 tablet (50 mg total) by mouth daily with breakfast.   pregabalin (LYRICA) 75 MG capsule TAKE 1 CAPSULE(75 MG) BY MOUTH THREE TIMES DAILY   Semaglutide-Weight Management (WEGOVY) 0.5 MG/0.5ML SOAJ Inject 0.5 mg into the skin once a week.   valACYclovir (VALTREX) 1000 MG tablet Take 1 tablet every 12 hours by oral route as needed.   No current facility-administered medications on file prior to visit.    Allergies:  Allergies  Allergen Reactions   Shellfish Allergy     swelling   Shellfish-Derived Products     swelling    Social  History:  Social History   Socioeconomic History   Marital status: Widowed    Spouse name: Not on file   Number of children: 0   Years of education: Not on file   Highest education level: Not on file  Occupational History   Not on file  Tobacco Use   Smoking status: Never   Smokeless tobacco: Never  Vaping Use   Vaping status: Never Used  Substance and Sexual Activity   Alcohol use: Yes    Comment: 1-4 glasses of wine per week   Drug use: No   Sexual activity: Not Currently  Other Topics Concern   Not on file  Social History Narrative   Not on file   Social Drivers of Health   Financial Resource Strain: Not on file  Food Insecurity: Not on file  Transportation Needs: Not on file  Physical Activity: Not on file  Stress: Not on file  Social Connections: Not on file  Intimate Partner Violence: Not on file   Social History   Tobacco Use  Smoking Status Never  Smokeless Tobacco Never   Social History   Substance and Sexual Activity  Alcohol Use Yes   Comment: 1-4 glasses of wine per week    Family History:  Family History  Problem Relation Age of Onset   Breast cancer Maternal Grandmother 101       deceased 10   Diabetes Maternal Grandmother    Pancreatic cancer Paternal Grandmother 62   Breast cancer Maternal Aunt 64       currently 26   Thyroid cancer Cousin        dx in her 80s   Breast cancer Mother 87       DCIS; currently 40   Hypertension Mother    Arrhythmia Mother        A-fib   Stroke Father    Hyperlipidemia Father    Seizures Father    Cancer Brother     Past medical history, surgical history, medications, allergies, family history and social history reviewed with patient today and changes made to appropriate areas of the chart.   ROS All other ROS negative except what is listed above and in the HPI.      Objective:    There were no vitals taken for this visit.  Wt Readings from Last 3 Encounters:  02/22/23 (!) 367 lb (166.5 kg)   07/07/22 (!) 379 lb (171.9 kg)  04/28/22 (!) 367 lb 12.8 oz (166.8 kg)    Physical Exam  Results for orders placed or  performed in visit on 07/07/22  Microalbumin, Urine Waived   Collection Time: 07/07/22  2:08 PM  Result Value Ref Range   Microalb, Ur Waived 10 0 - 19 mg/L   Creatinine, Urine Waived 10 10 - 300 mg/dL   Microalb/Creat Ratio 30-300 (H) <30 mg/g  Urinalysis, Routine w reflex microscopic   Collection Time: 07/07/22  2:08 PM  Result Value Ref Range   Specific Gravity, UA 1.015 1.005 - 1.030   pH, UA 7.0 5.0 - 7.5   Color, UA Yellow Yellow   Appearance Ur Clear Clear   Leukocytes,UA Negative Negative   Protein,UA Negative Negative/Trace   Glucose, UA Negative Negative   Ketones, UA Negative Negative   RBC, UA Negative Negative   Bilirubin, UA Negative Negative   Urobilinogen, Ur 0.2 0.2 - 1.0 mg/dL   Nitrite, UA Negative Negative   Microscopic Examination Comment   CBC with Differential/Platelet   Collection Time: 07/07/22  2:11 PM  Result Value Ref Range   WBC 6.0 3.4 - 10.8 x10E3/uL   RBC 4.03 3.77 - 5.28 x10E6/uL   Hemoglobin 11.2 11.1 - 15.9 g/dL   Hematocrit 62.1 30.8 - 46.6 %   MCV 85 79 - 97 fL   MCH 27.8 26.6 - 33.0 pg   MCHC 32.7 31.5 - 35.7 g/dL   RDW 65.7 84.6 - 96.2 %   Platelets 252 150 - 450 x10E3/uL   Neutrophils 62 Not Estab. %   Lymphs 27 Not Estab. %   Monocytes 6 Not Estab. %   Eos 4 Not Estab. %   Basos 1 Not Estab. %   Neutrophils Absolute 3.8 1.4 - 7.0 x10E3/uL   Lymphocytes Absolute 1.6 0.7 - 3.1 x10E3/uL   Monocytes Absolute 0.3 0.1 - 0.9 x10E3/uL   EOS (ABSOLUTE) 0.2 0.0 - 0.4 x10E3/uL   Basophils Absolute 0.0 0.0 - 0.2 x10E3/uL   Immature Granulocytes 0 Not Estab. %   Immature Grans (Abs) 0.0 0.0 - 0.1 x10E3/uL  Comprehensive metabolic panel   Collection Time: 07/07/22  2:11 PM  Result Value Ref Range   Glucose 94 70 - 99 mg/dL   BUN 13 8 - 27 mg/dL   Creatinine, Ser 9.52 0.57 - 1.00 mg/dL   eGFR 73 >84 XL/KGM/0.10    BUN/Creatinine Ratio 15 12 - 28   Sodium 141 134 - 144 mmol/L   Potassium 4.5 3.5 - 5.2 mmol/L   Chloride 103 96 - 106 mmol/L   CO2 21 20 - 29 mmol/L   Calcium 9.3 8.7 - 10.3 mg/dL   Total Protein 6.7 6.0 - 8.5 g/dL   Albumin 4.0 3.9 - 4.9 g/dL   Globulin, Total 2.7 1.5 - 4.5 g/dL   Albumin/Globulin Ratio 1.5 1.2 - 2.2   Bilirubin Total 0.3 0.0 - 1.2 mg/dL   Alkaline Phosphatase 80 44 - 121 IU/L   AST 19 0 - 40 IU/L   ALT 14 0 - 32 IU/L  Lipid Panel w/o Chol/HDL Ratio   Collection Time: 07/07/22  2:11 PM  Result Value Ref Range   Cholesterol, Total 155 100 - 199 mg/dL   Triglycerides 97 0 - 149 mg/dL   HDL 70 >27 mg/dL   VLDL Cholesterol Cal 18 5 - 40 mg/dL   LDL Chol Calc (NIH) 67 0 - 99 mg/dL  TSH   Collection Time: 07/07/22  2:11 PM  Result Value Ref Range   TSH 3.070 0.450 - 4.500 uIU/mL      Assessment & Plan:  Problem List Items Addressed This Visit   None    Follow up plan: No follow-ups on file.   LABORATORY TESTING:  - Pap smear: {Blank single:19197::"pap done","not applicable","up to date","done elsewhere"}  IMMUNIZATIONS:   - Tdap: Tetanus vaccination status reviewed: {tetanus status:315746}. - Influenza: {Blank single:19197::"Up to date","Administered today","Postponed to flu season","Refused","Given elsewhere"} - Pneumovax: {Blank single:19197::"Up to date","Administered today","Not applicable","Refused","Given elsewhere"} - Prevnar: {Blank single:19197::"Up to date","Administered today","Not applicable","Refused","Given elsewhere"} - COVID: {Blank single:19197::"Up to date","Administered today","Not applicable","Refused","Given elsewhere"} - HPV: {Blank single:19197::"Up to date","Administered today","Not applicable","Refused","Given elsewhere"} - Shingrix vaccine: {Blank single:19197::"Up to date","Administered today","Not applicable","Refused","Given elsewhere"}  SCREENING: -Mammogram: {Blank single:19197::"Up to date","Ordered today","Not  applicable","Refused","Done elsewhere"}  - Colonoscopy: {Blank single:19197::"Up to date","Ordered today","Not applicable","Refused","Done elsewhere"}  - Bone Density: {Blank single:19197::"Up to date","Ordered today","Not applicable","Refused","Done elsewhere"}  -Hearing Test: {Blank single:19197::"Up to date","Ordered today","Not applicable","Refused","Done elsewhere"}  -Spirometry: {Blank single:19197::"Up to date","Ordered today","Not applicable","Refused","Done elsewhere"}   PATIENT COUNSELING:   Advised to take 1 mg of folate supplement per day if capable of pregnancy.   Sexuality: Discussed sexually transmitted diseases, partner selection, use of condoms, avoidance of unintended pregnancy  and contraceptive alternatives.   Advised to avoid cigarette smoking.  I discussed with the patient that most people either abstain from alcohol or drink within safe limits (<=14/week and <=4 drinks/occasion for males, <=7/weeks and <= 3 drinks/occasion for females) and that the risk for alcohol disorders and other health effects rises proportionally with the number of drinks per week and how often a drinker exceeds daily limits.  Discussed cessation/primary prevention of drug use and availability of treatment for abuse.   Diet: Encouraged to adjust caloric intake to maintain  or achieve ideal body weight, to reduce intake of dietary saturated fat and total fat, to limit sodium intake by avoiding high sodium foods and not adding table salt, and to maintain adequate dietary potassium and calcium preferably from fresh fruits, vegetables, and low-fat dairy products.    stressed the importance of regular exercise  Injury prevention: Discussed safety belts, safety helmets, smoke detector, smoking near bedding or upholstery.   Dental health: Discussed importance of regular tooth brushing, flossing, and dental visits.    NEXT PREVENTATIVE PHYSICAL DUE IN 1 YEAR. No follow-ups on  file.

## 2023-07-14 LAB — CBC WITH DIFFERENTIAL/PLATELET
Basophils Absolute: 0 10*3/uL (ref 0.0–0.2)
Basos: 0 %
EOS (ABSOLUTE): 0.2 10*3/uL (ref 0.0–0.4)
Eos: 3 %
Hematocrit: 34.9 % (ref 34.0–46.6)
Hemoglobin: 11.3 g/dL (ref 11.1–15.9)
Immature Grans (Abs): 0 10*3/uL (ref 0.0–0.1)
Immature Granulocytes: 0 %
Lymphocytes Absolute: 1.4 10*3/uL (ref 0.7–3.1)
Lymphs: 24 %
MCH: 29.3 pg (ref 26.6–33.0)
MCHC: 32.4 g/dL (ref 31.5–35.7)
MCV: 90 fL (ref 79–97)
Monocytes Absolute: 0.3 10*3/uL (ref 0.1–0.9)
Monocytes: 5 %
Neutrophils Absolute: 3.8 10*3/uL (ref 1.4–7.0)
Neutrophils: 68 %
Platelets: 248 10*3/uL (ref 150–450)
RBC: 3.86 x10E6/uL (ref 3.77–5.28)
RDW: 14.4 % (ref 11.7–15.4)
WBC: 5.7 10*3/uL (ref 3.4–10.8)

## 2023-07-14 LAB — COMPREHENSIVE METABOLIC PANEL
ALT: 11 [IU]/L (ref 0–32)
AST: 17 [IU]/L (ref 0–40)
Albumin: 3.8 g/dL — ABNORMAL LOW (ref 3.9–4.9)
Alkaline Phosphatase: 84 [IU]/L (ref 44–121)
BUN/Creatinine Ratio: 18 (ref 12–28)
BUN: 15 mg/dL (ref 8–27)
Bilirubin Total: 0.3 mg/dL (ref 0.0–1.2)
CO2: 20 mmol/L (ref 20–29)
Calcium: 8.8 mg/dL (ref 8.7–10.3)
Chloride: 102 mmol/L (ref 96–106)
Creatinine, Ser: 0.85 mg/dL (ref 0.57–1.00)
Globulin, Total: 2.6 g/dL (ref 1.5–4.5)
Glucose: 109 mg/dL — ABNORMAL HIGH (ref 70–99)
Potassium: 4.2 mmol/L (ref 3.5–5.2)
Sodium: 138 mmol/L (ref 134–144)
Total Protein: 6.4 g/dL (ref 6.0–8.5)
eGFR: 76 mL/min/{1.73_m2} (ref >59)

## 2023-07-14 LAB — LIPID PANEL W/O CHOL/HDL RATIO
Cholesterol, Total: 139 mg/dL (ref 100–199)
HDL: 68 mg/dL (ref >39)
LDL Chol Calc (NIH): 57 mg/dL (ref 0–99)
Triglycerides: 70 mg/dL (ref 0–149)
VLDL Cholesterol Cal: 14 mg/dL (ref 5–40)

## 2023-07-14 LAB — TSH: TSH: 2.91 u[IU]/mL (ref 0.450–4.500)

## 2023-07-19 ENCOUNTER — Encounter: Payer: Self-pay | Admitting: Family Medicine

## 2023-07-31 ENCOUNTER — Encounter: Payer: Self-pay | Admitting: Family Medicine

## 2023-10-08 ENCOUNTER — Other Ambulatory Visit: Payer: Self-pay | Admitting: Family Medicine

## 2023-10-08 DIAGNOSIS — F339 Major depressive disorder, recurrent, unspecified: Secondary | ICD-10-CM

## 2023-10-09 NOTE — Telephone Encounter (Signed)
 Requested Prescriptions  Refused Prescriptions Disp Refills   escitalopram (LEXAPRO) 20 MG tablet [Pharmacy Med Name: ESCITALOPRAM 20MG  TABLETS] 90 tablet 1    Sig: TAKE 1 TABLET(20 MG) BY MOUTH DAILY     Psychiatry:  Antidepressants - SSRI Passed - 10/09/2023  5:28 PM      Passed - Completed PHQ-2 or PHQ-9 in the last 360 days      Passed - Valid encounter within last 6 months    Recent Outpatient Visits           2 months ago Encounter for annual wellness exam in Medicare patient   [place] [facility] [place], Megan P, DO   5 months ago Nasal congestion   [place] [facility] [place], Megan P, DO   5 months ago Acute non-recurrent maxillary sinusitis   [place] [facility] [place], Megan P, DO   7 months ago Morbid obesity [facility])   [place] [facility], Megan P, DO   10 months ago Morbid obesity [facility])   [place] [facility] [place], [place], DO       Future Appointments             In 3 months Johnson, Oralia Rud, DO [facility]             losartan (COZAAR) 25 MG tablet [Pharmacy Med Name: LOSARTAN 25MG  TABLETS] 90 tablet 1    Sig: TAKE 1 TABLET(25 MG) BY MOUTH DAILY     Cardiovascular:  Angiotensin Receptor Blockers Passed - 10/09/2023  5:28 PM      Passed - Cr in normal range and within 180 days    Creatinine, Ser  Date Value Ref Range Status  07/13/2023 0.85 0.57 - 1.00 mg/dL Final         Passed - K in normal range and within 180 days    Potassium  Date Value Ref Range Status  07/13/2023 4.2 3.5 - 5.2 mmol/L Final         Passed - Patient is not pregnant      Passed - Last BP in normal range    BP Readings from Last 1 Encounters:  07/13/23 117/72         Passed - Valid encounter within last 6 months    Recent Outpatient Visits           2 months ago Encounter for annual wellness exam in Medicare patient   [place] [facility] [place], Megan P, DO   5 months ago Nasal congestion   [place] [facility] [place], Megan P, DO   5 months ago Acute non-recurrent maxillary sinusitis   [place] [facility] [place], Megan P, DO   7 months ago Morbid obesity [facility])   [facility] [place], Megan P, DO   10 months ago Morbid obesity [facility])   [place] [facility] [place], [place], DO       Future Appointments             In 3 months Johnson, Megan P, DO [place] [facility]             meloxicam (MOBIC) 15 MG tablet [Pharmacy Med Name: MELOXICAM 15MG  TABLETS] 90 tablet 1    Sig: TAKE 1 TABLET(15 MG) BY MOUTH DAILY     Analgesics:  COX2 Inhibitors Failed - 10/09/2023  5:28 PM  Failed - Manual Review: Labs are only required if the patient has taken medication for more than 8 weeks.      Passed - HGB in normal range and within 360 days    Hemoglobin  Date Value Ref Range Status  07/13/2023 11.3 11.1 - 15.9 g/dL Final         Passed - Cr in normal range and within 360 days    Creatinine, Ser  Date Value Ref Range Status  07/13/2023 0.85 0.57 - 1.00 mg/dL Final         Passed - HCT in normal range and within 360 days    Hematocrit  Date Value Ref Range Status  07/13/2023 34.9 34.0 - 46.6 % Final         Passed - AST in normal range and within 360 days    AST  Date Value Ref Range Status  07/13/2023 17 0 - 40 IU/L Final         Passed - ALT in normal range and within 360 days    ALT  Date Value Ref Range Status  07/13/2023 11 0 - 32 IU/L Final         Passed - eGFR is 30 or above and within 360 days    GFR calc Af Amer  Date Value Ref Range Status  06/24/2020 105 >59 mL/min/1.73 Final    Comment:    **In accordance with recommendations from the NKF-ASN Task force,**   [facility] is in the process of updating its eGFR calculation to the   2021 CKD-EPI creatinine equation that estimates kidney  function   without a race variable.    GFR calc non Af Amer  Date Value Ref Range Status  06/24/2020 91 >59 mL/min/1.73 Final   eGFR  Date Value Ref Range Status  07/13/2023 76 >59 mL/min/1.73 Final         Passed - Patient is not pregnant      Passed - Valid encounter within last 12 months    Recent Outpatient Visits           2 months ago Encounter for annual wellness exam in Medicare patient   [place] [facility] [place], Megan P, DO   5 months ago Nasal congestion   [place] [facility] [place], Megan P, DO   5 months ago Acute non-recurrent maxillary sinusitis   [place] [facility] [place], Megan P, DO   7 months ago Morbid obesity [facility])   [place] [facility] [place], Megan P, DO   10 months ago Morbid obesity [facility])   [place] [place], Oralia Rud, DO       Future Appointments             In 3 months [place], Oralia Rud, DO [place] [facility]

## 2023-10-11 ENCOUNTER — Encounter: Payer: Self-pay | Admitting: Family Medicine

## 2023-10-12 NOTE — Telephone Encounter (Signed)
 Appt, in person better, but needs to be able to get a weight if virtual

## 2023-10-15 ENCOUNTER — Telehealth (INDEPENDENT_AMBULATORY_CARE_PROVIDER_SITE_OTHER): Admitting: Family Medicine

## 2023-10-15 VITALS — Wt 360.0 lb

## 2023-10-15 DIAGNOSIS — G4733 Obstructive sleep apnea (adult) (pediatric): Secondary | ICD-10-CM

## 2023-10-15 DIAGNOSIS — Z6841 Body Mass Index (BMI) 40.0 and over, adult: Secondary | ICD-10-CM | POA: Diagnosis not present

## 2023-10-15 MED ORDER — TIRZEPATIDE-WEIGHT MANAGEMENT 2.5 MG/0.5ML ~~LOC~~ SOLN
2.5000 mg | SUBCUTANEOUS | 1 refills | Status: DC
Start: 2023-10-15 — End: 2024-02-12

## 2023-10-15 NOTE — Assessment & Plan Note (Signed)
 Down 20lbs with inconsistent use of compounded wegovy. Will attempt to get zepbound approved for OSA. Recheck 4 weeks.

## 2023-10-15 NOTE — Assessment & Plan Note (Signed)
 Tolerating her CPAP well. Will start zepbound to help. Call with any concerns. Recheck 1 month.

## 2023-10-15 NOTE — Progress Notes (Signed)
 Wt (!) 360 lb (163.3 kg)   BMI 54.74 kg/m    Subjective:    Patient ID: Mary Hardin, female    DOB: March 20, 1956, 67 y.o.   MRN: [id number]  HPI: Mary Hardin is a 68 y.o. female  Chief Complaint  Patient presents with   Sleep Apnea   OBESITY- has not been taking her wegovy regularly.  Duration: chronic Previous attempts at weight loss: yes Complications of obesity: HTN, OSA, depression, GERD Peak weight: 380lbs Weight loss goal: to be healthy Weight loss to date: 20 lbs (+5lbs since last visit) Requesting obesity pharmacotherapy: yes Current weight loss supplements/medications: yes Previous weight loss supplements/meds: no  SLEEP APNEA Sleep apnea status: stable Duration: chronic Satisfied with current treatment?:  no CPAP use:  yes Sleep quality with CPAP use: excellent Treament compliance:excellent compliance Last sleep study:  Treatments attempted: CPAP Wakes feeling refreshed:  yes Daytime hypersomnolence:  no Fatigue:  no Insomnia:  no Good sleep hygiene:  yes Difficulty falling asleep:  no Difficulty staying asleep:  no Snoring bothers bed partner:  yes Observed apnea by bed partner: yes Obesity:  yes Hypertension: yes  Pulmonary hypertension:  no Coronary artery disease:  no  Clinical coverage for weight loss GLP's   Medication being dispensed is Zepbound 2 mL/28 days. Titration doses are 2 mL/28 days.   [x]  Product being prescribed is FDA approved for the indication, age, weight (if applicable) and not does not exceed dosing limits per the Prescribing Information per the clinical conditions for use.  [x]  Patient's baseline weight measured within the last 45 days as required by provider before dispensing.  [x]  Patient is new to therapy and One of the following:   [x]  The beneficiary is 67 years of age or over and has ONE of the following:  [x]  A BMI greater than or equal to 30 kg/m2  [x]  A BMI greater than or equal to 27 kg/m2 with  at least one weight-related comorbidity/risk factor/complication (i.e. hypertension, type 2 diabetes, obstructive sleep apnea, cardiovascular disease, dyslipidemia)  [x]  If patient has one weight-related comorbidity/risk factor/complication (i.e. hypertension, type 2 diabetes, obstructive sleep apnea, cardiovascular disease, dyslipidemia), please list  Patient suffers from weight-related comorbidity/risk factor/complication OSA, HTN, depression, GERD  [x]  If patient has one weight-related comorbidity/risk factor/complication (i.e. hypertension, type 2 diabetes, obstructive sleep apnea, cardiovascular disease, dyslipidemia), please list  Patient suffers from weight-related comorbidity/risk factor/complication HTN, OSA, depression, GERD  []  The beneficiary is 67 years of age or older with a BMI greater than or equal to 27 kg/m2 AND has established cardiovascular disease (CVD) defined as having a history of myocardial infarction, stroke, or symptomatic peripheral disease, to be documented on the PA form. AND  [x]  The beneficiary is currently on and will continue lifestyle modification including structured nutrition and physical activity, unless physical activity is not clinically appropriate at the time GLP1 therapy commences AND  [x]  The beneficiary will NOT be using the requested agent in combination with another GLP-1 receptor agonist agent AND  [x]  The beneficiary does NOT have any FDA-labeled contraindications to the requested agent, including pregnancy, lactation, history of medullary thyroid cancer or multiple endocrine neoplasia type II.   Last BMI/Weight/Height recorded Estimated body mass index is 54.74 kg/m as calculated from the following:   Height as of 07/07/22: 5\' 8"  (1.727 m).   Weight as of this encounter: 360 lb (163.3 kg).     Relevant past medical, surgical, family and social history reviewed and updated as  indicated. Interim medical history since our last visit  reviewed. Allergies and medications reviewed and updated.  Review of Systems  Constitutional: Negative.   Respiratory: Negative.    Cardiovascular: Negative.   Gastrointestinal: Negative.   Neurological: Negative.   Psychiatric/Behavioral: Negative.      Per HPI unless specifically indicated above     Objective:    Wt (!) 360 lb (163.3 kg)   BMI 54.74 kg/m   Wt Readings from Last 3 Encounters:  10/15/23 (!) 360 lb (163.3 kg)  07/13/23 (!) 355 lb (161 kg)  02/22/23 (!) 367 lb (166.5 kg)    Physical Exam Vitals and nursing note reviewed.  Constitutional:      General: She is not in acute distress.    Appearance: Normal appearance. She is obese. She is not ill-appearing, toxic-appearing or diaphoretic.  HENT:     Head: Normocephalic and atraumatic.     Right Ear: External ear normal.     Left Ear: External ear normal.     Nose: Nose normal.     Mouth/Throat:     Mouth: Mucous membranes are moist.     Pharynx: Oropharynx is clear.  Eyes:     General: No scleral icterus.       Right eye: No discharge.        Left eye: No discharge.     Conjunctiva/sclera: Conjunctivae normal.     Pupils: Pupils are equal, round, and reactive to light.  Pulmonary:     Effort: Pulmonary effort is normal. No respiratory distress.     Comments: Speaking in full sentences Musculoskeletal:        General: Normal range of motion.     Cervical back: Normal range of motion.  Skin:    Coloration: Skin is not jaundiced or pale.     Findings: No bruising, erythema, lesion or rash.  Neurological:     Mental Status: She is alert and oriented to person, place, and time. Mental status is at baseline.  Psychiatric:        Mood and Affect: Mood normal.        Behavior: Behavior normal.        Thought Content: Thought content normal.        Judgment: Judgment normal.     Results for orders placed or performed in visit on 07/31/23  HM DEXA SCAN   Collection Time: 10/09/17 12:00 AM  Result  Value Ref Range   HM Dexa Scan Normal- see scanned document       Assessment & Plan:   Problem List Items Addressed This Visit       Respiratory   OSA (obstructive sleep apnea) - Primary   Tolerating her CPAP well. Will start zepbound to help. Call with any concerns. Recheck 1 month.       Relevant Medications   tirzepatide (ZEPBOUND) 2.5 MG/0.5ML injection vial     Other   Morbid obesity (HCC)   Down 20lbs with inconsistent use of compounded wegovy. Will attempt to get zepbound approved for OSA. Recheck 4 weeks.       Relevant Medications   tirzepatide (ZEPBOUND) 2.5 MG/0.5ML injection vial     Follow up plan: Return in about 4 weeks (around 11/12/2023) for virtual OK.    This visit was completed via video visit through MyChart due to the restrictions of the COVID-19 pandemic. All issues as above were discussed and addressed. Physical exam was done as above through visual confirmation on video through MyChart.  If it was felt that the patient should be evaluated in the office, they were directed there. The patient verbally consented to this visit. Location of the patient: home Location of the provider: work Those involved with this call:  Provider: Olevia Perches, DO CMA:  Maggie Font, CMA Front Desk/Registration: Servando Snare  Time spent on call:  15 minutes with patient face to face via video conference. More than 50% of this time was spent in counseling and coordination of care. 23 minutes total spent in review of patient's record and preparation of their chart.

## 2023-10-19 NOTE — Progress Notes (Signed)
 Called patient to schedule a 4 week appointment she states she has not received the Zepbound and that she would call to schedule appointment once she receives and starts the medication.

## 2024-01-18 ENCOUNTER — Telehealth: Payer: Self-pay | Admitting: Family Medicine

## 2024-02-12 ENCOUNTER — Encounter: Payer: Self-pay | Admitting: Family Medicine

## 2024-02-12 ENCOUNTER — Telehealth: Admitting: Family Medicine

## 2024-02-12 VITALS — BP 117/80 | Ht 68.0 in | Wt 350.0 lb

## 2024-02-12 DIAGNOSIS — I10 Essential (primary) hypertension: Secondary | ICD-10-CM | POA: Diagnosis not present

## 2024-02-12 DIAGNOSIS — G4733 Obstructive sleep apnea (adult) (pediatric): Secondary | ICD-10-CM

## 2024-02-12 DIAGNOSIS — F339 Major depressive disorder, recurrent, unspecified: Secondary | ICD-10-CM

## 2024-02-12 MED ORDER — ZEPBOUND 5 MG/0.5ML ~~LOC~~ SOAJ: 5.0000 mg | SUBCUTANEOUS | 0 refills | Status: DC

## 2024-02-12 MED ORDER — MELOXICAM 15 MG PO TABS: 15.0000 mg | ORAL_TABLET | Freq: Every day | ORAL | 1 refills | Status: DC

## 2024-02-12 MED ORDER — ESOMEPRAZOLE MAGNESIUM 40 MG PO CPDR: DELAYED_RELEASE_CAPSULE | ORAL | 1 refills | Status: DC

## 2024-02-12 MED ORDER — LOSARTAN POTASSIUM 25 MG PO TABS: 25.0000 mg | ORAL_TABLET | Freq: Every day | ORAL | 1 refills | Status: DC

## 2024-02-12 MED ORDER — ZEPBOUND 2.5 MG/0.5ML ~~LOC~~ SOAJ: 2.5000 mg | SUBCUTANEOUS | 0 refills | Status: DC

## 2024-02-12 MED ORDER — ESCITALOPRAM OXALATE 20 MG PO TABS: 20.0000 mg | ORAL_TABLET | Freq: Every day | ORAL | 1 refills | Status: DC

## 2024-02-12 MED ORDER — PREGABALIN 75 MG PO CAPS: 75.0000 mg | ORAL_CAPSULE | Freq: Three times a day (TID) | ORAL | 1 refills | Status: DC

## 2024-02-12 NOTE — Progress Notes (Signed)
 BP 117/80   Ht 5' 8 (1.727 m)   Wt (!) 350 lb (158.8 kg)   BMI 53.22 kg/m    Subjective:    Patient ID: Mary Hardin, female    DOB: November 04, 1955, 68 y.o.   MRN: [id number]  HPI: Mary Hardin is a 68 y.o. female  Chief Complaint  Patient presents with   Hypertension   Obesity   Depression   Anxiety   HYPERTENSION  Hypertension status: controlled  Satisfied with current treatment? yes Duration of hypertension: chronic BP monitoring frequency:  rarely BP medication side effects:  no Medication compliance: excellent compliance Previous BP meds: losartan  Aspirin: no Recurrent headaches: no Visual changes: no Palpitations: no Dyspnea: no Chest pain: no Lower extremity edema: no Dizzy/lightheaded: no  DEPRESSION Mood status: controlled Satisfied with current treatment?: yes Symptom severity: mild  Duration of current treatment : chronic Side effects: no Medication compliance: excellent compliance Psychotherapy/counseling: no  Previous psychiatric medications: lexapro  Depressed mood: no Anxious mood: no Anhedonia: no Significant weight loss or gain: no Insomnia: no  Fatigue: yes Feelings of worthlessness or guilt: no Impaired concentration/indecisiveness: no Suicidal ideations: no Hopelessness: no Crying spells: no    10/15/2023    3:56 PM 10/15/2023    3:55 PM 07/13/2023    9:54 AM 02/22/2023   10:41 AM 12/07/2022    4:17 PM  Depression screen PHQ 2/9  Decreased Interest 0 0 0 0 2  Down, Depressed, Hopeless 0 0 0 0 0  PHQ - 2 Score 0 0 0 0 2  Altered sleeping 0  0 0 0  Tired, decreased energy 0  0 0 3  Change in appetite 0  0 0 3  Feeling bad or failure about yourself  0  0 0 3  Trouble concentrating 0  0 0 3  Moving slowly or fidgety/restless 0  0 0 0  Suicidal thoughts 0  0 0 0  PHQ-9 Score 0  0 0 14  Difficult doing work/chores Not difficult at all  Not difficult at all Not difficult at all Not difficult at all    SLEEP APNEA Sleep  apnea status: stable Duration: chronic Satisfied with current treatment?:  yes CPAP use:  yes Sleep quality with CPAP use: good Treament compliance:excellent compliance Last sleep study:  Treatments attempted: CPAP Wakes feeling refreshed:  yes Daytime hypersomnolence:  no Fatigue:  no Insomnia:  no Good sleep hygiene:  yes Difficulty falling asleep:  no Difficulty staying asleep:  no Snoring bothers bed partner:  yes Observed apnea by bed partner: yes Obesity:  yes Hypertension: yes  Pulmonary hypertension:  no Coronary artery disease:  no    Relevant past medical, surgical, family and social history reviewed and updated as indicated. Interim medical history since our last visit reviewed. Allergies and medications reviewed and updated.  Review of Systems  Constitutional: Negative.   Respiratory: Negative.    Cardiovascular: Negative.   Musculoskeletal: Negative.   Neurological: Negative.   Psychiatric/Behavioral: Negative.      Per HPI unless specifically indicated above     Objective:    BP 117/80   Ht 5' 8 (1.727 m)   Wt (!) 350 lb (158.8 kg)   BMI 53.22 kg/m   Wt Readings from Last 3 Encounters:  02/12/24 (!) 350 lb (158.8 kg)  10/15/23 (!) 360 lb (163.3 kg)  07/13/23 (!) 355 lb (161 kg)    Physical Exam Vitals and nursing note reviewed.  Constitutional:  General: She is not in acute distress.    Appearance: Normal appearance. She is obese. She is not ill-appearing, toxic-appearing or diaphoretic.  HENT:     Head: Normocephalic and atraumatic.     Right Ear: External ear normal.     Left Ear: External ear normal.     Nose: Nose normal.     Mouth/Throat:     Mouth: Mucous membranes are moist.     Pharynx: Oropharynx is clear.  Eyes:     General: No scleral icterus.       Right eye: No discharge.        Left eye: No discharge.     Conjunctiva/sclera: Conjunctivae normal.     Pupils: Pupils are equal, round, and reactive to light.   Pulmonary:     Effort: Pulmonary effort is normal. No respiratory distress.     Comments: Speaking in full sentences Musculoskeletal:        General: Normal range of motion.     Cervical back: Normal range of motion.  Skin:    Coloration: Skin is not jaundiced or pale.     Findings: No bruising, erythema, lesion or rash.  Neurological:     Mental Status: She is alert and oriented to person, place, and time. Mental status is at baseline.  Psychiatric:        Mood and Affect: Mood normal.        Behavior: Behavior normal.        Thought Content: Thought content normal.        Judgment: Judgment normal.     Results for orders placed or performed in visit on 07/31/23  HM DEXA SCAN   Collection Time: 10/09/17 12:00 AM  Result Value Ref Range   HM Dexa Scan Normal- see scanned document       Assessment & Plan:   Problem List Items Addressed This Visit       Cardiovascular and Mediastinum   HTN (hypertension) - Primary   Under good control on current regimen. Continue current regimen. Continue to monitor. Call with any concerns. Refills given. Will come in for labs in the next couple of weeks- orders in.        Relevant Medications   losartan  (COZAAR ) 25 MG tablet   Other Relevant Orders   Basic metabolic panel with GFR     Respiratory   OSA (obstructive sleep apnea)   Would benefit from zepbound . Rx sent to her pharmacy. Call with any concerns.         Other   Depression, recurrent (HCC)   Under good control on current regimen. Continue current regimen. Continue to monitor. Call with any concerns. Refills given.        Relevant Medications   escitalopram  (LEXAPRO ) 20 MG tablet   Morbid obesity (HCC)   Encouraged diet and exercise with goal of losing 1-2lbs per week. Call with any concerns.       Relevant Medications   tirzepatide  (ZEPBOUND ) 2.5 MG/0.5ML Pen   tirzepatide  (ZEPBOUND ) 5 MG/0.5ML Pen (Start on 03/11/2024)     Follow up plan: Return in about 6  months (around 08/14/2024) for physical, in person.   This visit was completed via video visit through MyChart due to the restrictions of the COVID-19 pandemic. All issues as above were discussed and addressed. Physical exam was done as above through visual confirmation on video through MyChart. If it was felt that the patient should be evaluated in the office, they were directed  there. The patient verbally consented to this visit. Location of the patient: home Location of the provider: work Those involved with this call:  Provider: Duwaine Louder, DO CMA: York Fogo, CMA, Front Desk/Registration: Claretta Maiden  Time spent on call: 25 minutes with patient face to face via video conference. More than 50% of this time was spent in counseling and coordination of care. 40 minutes total spent in review of patient's record and preparation of their chart.

## 2024-02-12 NOTE — Assessment & Plan Note (Signed)
 Under good control on current regimen. Continue current regimen. Continue to monitor. Call with any concerns. Refills given. Will come in for labs in the next couple of weeks- orders in.

## 2024-02-12 NOTE — Assessment & Plan Note (Signed)
 Would benefit from zepbound . Rx sent to her pharmacy. Call with any concerns.

## 2024-02-12 NOTE — Assessment & Plan Note (Signed)
 Under good control on current regimen. Continue current regimen. Continue to monitor. Call with any concerns. Refills given.

## 2024-02-12 NOTE — Assessment & Plan Note (Signed)
 Encouraged diet and exercise with goal of losing 1-2lbs per week. Call with any concerns.

## 2024-02-13 NOTE — Progress Notes (Signed)
 Called patient and left a message for her to call back to get scheduled for (Return in about 6 months (around 08/14/2024) for physical, in person.)

## 2024-02-14 ENCOUNTER — Telehealth: Payer: Self-pay

## 2024-02-14 ENCOUNTER — Other Ambulatory Visit (HOSPITAL_COMMUNITY): Payer: Self-pay

## 2024-02-14 NOTE — Telephone Encounter (Signed)
 Pharmacy Patient Advocate Encounter   Received notification from CoverMyMeds that prior authorization for Zepbound  2.5MG /0.5ML pen-injectors  is required/requested.   Insurance verification completed.   The patient is insured through [facility] .   Per test claim: Product not on formulary

## 2024-02-14 NOTE — Progress Notes (Signed)
 Called patient and left a message for her to call back to get scheduled for her physical in 6 months

## 2024-03-11 ENCOUNTER — Telehealth: Payer: Self-pay

## 2024-03-11 DIAGNOSIS — M545 Low back pain, unspecified: Secondary | ICD-10-CM | POA: Insufficient documentation

## 2024-03-11 NOTE — Telephone Encounter (Signed)
 Copied from CRM [id number]. Topic: Clinical - Prescription Issue >> Mar 11, 2024 12:48 PM DeAngela L wrote: Reason for CRM: patient states the pain has not stopped and she would like to ask if her provider if the prescription for LYRICA  Could be increased to help her with the pain, the patient states the sciatic pain is still aching after taking both  meloxicam  (MOBIC ) 15 MG tablet pregabalin  (LYRICA ) 75 MG capsule Patient reports having tingling and numbness type of effects in her right leg after so pain doesn't stop   Pt went to the back doctor and they are going to get her setup with PT and referring her to have pain management shot but this will be a while before she can get in  Pt num [phone] (M)

## 2024-03-11 NOTE — Telephone Encounter (Signed)
 I do see medications from 02/12/2024 but no mention in the office note from that day. Forwarding to PCP for review.

## 2024-03-14 ENCOUNTER — Telehealth: Payer: Self-pay

## 2024-03-14 NOTE — Telephone Encounter (Signed)
Virtual appt please 

## 2024-03-14 NOTE — Telephone Encounter (Signed)
 Copied from CRM [id number]. Topic: Clinical - Prescription Issue >> Mar 11, 2024 12:48 PM DeAngela L wrote: Reason for CRM: patient states the pain has not stopped and she would like to ask if her provider if the prescription for LYRICA  Could be increased to help her with the pain, the patient states the sciatic pain is still aching after taking both  meloxicam  (MOBIC ) 15 MG tablet pregabalin  (LYRICA ) 75 MG capsule Patient reports having tingling and numbness type of effects in her right leg after so pain doesn't stop   Pt went to the back doctor and they are going to get her setup with PT and referring her to have pain management shot but this will be a while before she can get in  Pt num [phone] (M) >> Mar 13, 2024  2:16 PM Ivette P wrote: Pt is still having pain from the sciatica nerve, asking for an update before end of day.

## 2024-03-15 ENCOUNTER — Encounter: Payer: Self-pay | Admitting: Family Medicine

## 2024-03-17 NOTE — Telephone Encounter (Signed)
 Yes, virtual Ok

## 2024-03-17 NOTE — Telephone Encounter (Signed)
 Appt for 8/26 at 920 confirmed through [facility]

## 2024-03-18 ENCOUNTER — Encounter: Payer: Self-pay | Admitting: Family Medicine

## 2024-03-18 ENCOUNTER — Telehealth (INDEPENDENT_AMBULATORY_CARE_PROVIDER_SITE_OTHER): Admitting: Family Medicine

## 2024-03-18 DIAGNOSIS — M5431 Sciatica, right side: Secondary | ICD-10-CM

## 2024-03-18 MED ORDER — BACLOFEN 10 MG PO TABS: 5.0000 mg | ORAL_TABLET | Freq: Every evening | ORAL | 0 refills | Status: DC | PRN

## 2024-03-18 MED ORDER — PREGABALIN 150 MG PO CAPS: 150.0000 mg | ORAL_CAPSULE | Freq: Three times a day (TID) | ORAL | 1 refills | Status: DC

## 2024-03-18 NOTE — Patient Instructions (Addendum)
 150mg  in the AM 75 in Afternoon, 150mg  in the PM for 1 week 150mg  TID

## 2024-03-18 NOTE — Progress Notes (Signed)
 There were no vitals taken for this visit.   Subjective:    Patient ID: Mary Hardin, female    DOB: 1956-02-10, 68 y.o.   MRN: [id number]  HPI: Mary Hardin is a 68 y.o. female  Chief Complaint  Patient presents with   Back Pain   BACK PAIN- went to see ortho about a week ago and they didn't have much they could do for her. She was referred to PT for aquatherapy. She notes that she has not been feeling well. She has self increased to 150mg  in the AM on the lyrica . Has been taking 75mg  in the PM and at bedtime. She is still not feeling well. Duration: chronic, but much worse in the past 2 weeks Mechanism of injury: unknown Location: low back Onset: sudden Severity: severe Quality: shooting, aching, numb, tingling Frequency: constant Radiation: R leg below the knee Aggravating factors: movement, sitting  Alleviating factors: higher dose of lyrica  Status: stable Treatments attempted: lyrica , rest, ice, heat, APAP, ibuprofen, and aleve   Relief with NSAIDs?: no Nighttime pain:  no Paresthesias / decreased sensation:  yes Bowel / bladder incontinence:  no Fevers:  no Dysuria / urinary frequency:  no  Relevant past medical, surgical, family and social history reviewed and updated as indicated. Interim medical history since our last visit reviewed. Allergies and medications reviewed and updated.  Review of Systems  Constitutional: Negative.   Respiratory: Negative.    Cardiovascular: Negative.   Musculoskeletal:  Positive for back pain and myalgias. Negative for arthralgias, gait problem, joint swelling, neck pain and neck stiffness.  Skin: Negative.   Neurological:  Positive for numbness. Negative for dizziness, tremors, seizures, syncope, facial asymmetry, speech difficulty, weakness, light-headedness and headaches.  Psychiatric/Behavioral: Negative.      Per HPI unless specifically indicated above     Objective:    There were no vitals taken for this visit.   Wt Readings from Last 3 Encounters:  02/12/24 (!) 350 lb (158.8 kg)  10/15/23 (!) 360 lb (163.3 kg)  07/13/23 (!) 355 lb (161 kg)    Physical Exam Vitals and nursing note reviewed.  Constitutional:      General: She is not in acute distress.    Appearance: Normal appearance. She is not ill-appearing, toxic-appearing or diaphoretic.  HENT:     Head: Normocephalic and atraumatic.     Right Ear: External ear normal.     Left Ear: External ear normal.     Nose: Nose normal.     Mouth/Throat:     Mouth: Mucous membranes are moist.     Pharynx: Oropharynx is clear.  Eyes:     General: No scleral icterus.       Right eye: No discharge.        Left eye: No discharge.     Conjunctiva/sclera: Conjunctivae normal.     Pupils: Pupils are equal, round, and reactive to light.  Pulmonary:     Effort: Pulmonary effort is normal. No respiratory distress.     Comments: Speaking in full sentences Musculoskeletal:        General: Normal range of motion.     Cervical back: Normal range of motion.  Skin:    Coloration: Skin is not jaundiced or pale.     Findings: No bruising, erythema, lesion or rash.  Neurological:     Mental Status: She is alert and oriented to person, place, and time. Mental status is at baseline.  Psychiatric:  Mood and Affect: Mood normal.        Behavior: Behavior normal.        Thought Content: Thought content normal.        Judgment: Judgment normal.     Results for orders placed or performed in visit on 07/31/23  HM DEXA SCAN   Collection Time: 10/09/17 12:00 AM  Result Value Ref Range   HM Dexa Scan Normal- see scanned document       Assessment & Plan:   Problem List Items Addressed This Visit   None Visit Diagnoses       Right sided sciatica    -  Primary   Will increase her lyrica  to 150mg  BID. Encouraged her to make appointment with aquatherapy. Will send Rx for baclofen . Recheck 3 weeks. Call with any concerns.   Relevant Medications    pregabalin  (LYRICA ) 150 MG capsule   baclofen  (LIORESAL ) 10 MG tablet        Follow up plan: Return in about 3 weeks (around 04/08/2024) for virtual OK.    This visit was completed via video visit through MyChart due to the restrictions of the COVID-19 pandemic. All issues as above were discussed and addressed. Physical exam was done as above through visual confirmation on video through MyChart. If it was felt that the patient should be evaluated in the office, they were directed there. The patient verbally consented to this visit. Location of the patient: parking lot Location of the provider: work Those involved with this call:  Provider: Duwaine Louder, DO CMA: York Fogo, CMA, Front Desk/Registration: Claretta Maiden  Time spent on call: 15 minutes with patient face to face via video conference. More than 50% of this time was spent in counseling and coordination of care. 23 minutes total spent in review of patient's record and preparation of their chart.

## 2024-03-18 NOTE — Progress Notes (Signed)
 Scheduled

## 2024-03-25 ENCOUNTER — Ambulatory Visit (INDEPENDENT_AMBULATORY_CARE_PROVIDER_SITE_OTHER): Admitting: Nurse Practitioner

## 2024-03-25 ENCOUNTER — Encounter: Payer: Self-pay | Admitting: Nurse Practitioner

## 2024-03-25 VITALS — BP 136/84 | HR 80 | Temp 97.7°F | Ht 68.0 in | Wt 354.0 lb

## 2024-03-25 DIAGNOSIS — I10 Essential (primary) hypertension: Secondary | ICD-10-CM | POA: Diagnosis not present

## 2024-03-25 DIAGNOSIS — G4733 Obstructive sleep apnea (adult) (pediatric): Secondary | ICD-10-CM | POA: Diagnosis not present

## 2024-03-25 DIAGNOSIS — Z6841 Body Mass Index (BMI) 40.0 and over, adult: Secondary | ICD-10-CM

## 2024-03-25 DIAGNOSIS — E66813 Obesity, class 3: Secondary | ICD-10-CM

## 2024-03-25 NOTE — Progress Notes (Signed)
 Office: [phone]  /  Fax: [phone]   Initial Visit  Mary Hardin was seen in clinic today to evaluate for obesity. She is interested in losing weight to improve overall health and reduce the risk of weight related complications. She presents today to review program treatment options, initial physical assessment, and evaluation.     She was referred by: Specialist  When asked what else they would like to accomplish? She states: Adopt a healthier eating pattern and lifestyle, Improve energy levels and physical activity, Improve existing medical conditions, and Improve quality of life     Her goal is to lose weight to be able to proceed with knee surgery.   When asked how has your weight affected you? She states: Contributed to medical problems, Contributed to orthopedic problems or mobility issues, Having fatigue, and Having poor endurance  Some associated conditions: Hypertension, OSAS on CPAP, GERD, osteoarthritis, anxiety, depression, atherosclerosis, back and knee pain, hypertension  Contributing factors: family history of obesity and reduced physical activity  Weight promoting medications identified: None  Current nutrition plan: None  Current level of physical activity: None due to knee pain and back pain  Current or previous pharmacotherapy: GLP-1-took Wegovy  stopped due to hair loss  Response to medication: she is currently taking compounded semaglutide  and has lost around 30 lbs.    Past medical history includes:   Past Medical History:  Diagnosis Date   Anemia    Anxiety    Arthritis    Asthma    Depression    Family history of adverse reaction to anesthesia    Pt mother has PONV   Family history of malignant neoplasm of breast    Fatty liver    GERD (gastroesophageal reflux disease)    Headache    Hernia of abdominal wall    Hypertension    PONV (postoperative nausea and vomiting)    Sleep apnea    wears cpap     Objective:   BP 136/84    Pulse 80   Temp 97.7 F (36.5 C)   Ht 5' 8 (1.727 m)   Wt (!) 354 lb (160.6 kg)   SpO2 97%   BMI 53.83 kg/m  She was weighed on the bioimpedance scale: Body mass index is 53.83 kg/m.  Peak Weight:380 lbs , Body Fat%:59.8%, Visceral Fat Rating:25, Weight trend over the last 12 months: Decreasing  General:  Alert, oriented and cooperative. Patient is in no acute distress.  Respiratory: Normal respiratory effort, no problems with respiration noted   Gait: able to ambulate independently  Mental Status: Normal mood and affect. Normal behavior. Normal judgment and thought content.   DIAGNOSTIC DATA REVIEWED:  BMET    Component Value Date/Time   NA 138 07/13/2023 0958   K 4.2 07/13/2023 0958   CL 102 07/13/2023 0958   CO2 20 07/13/2023 0958   GLUCOSE 109 (H) 07/13/2023 0958   BUN 15 07/13/2023 0958   CREATININE 0.85 07/13/2023 0958   CALCIUM 8.8 07/13/2023 0958   GFRNONAA 91 06/24/2020 0932   GFRAA 105 06/24/2020 0932   Lab Results  Component Value Date   HGBA1C 5.4 07/13/2023   HGBA1C 5.4 10/27/2019   No results found for: INSULIN CBC    Component Value Date/Time   WBC 5.7 07/13/2023 0958   WBC 7.8 08/26/2007 1112   RBC 3.86 07/13/2023 0958   RBC 4.22 08/26/2007 1112   HGB 11.3 07/13/2023 0958   HCT 34.9 07/13/2023 0958   PLT 248 07/13/2023  0958   MCV 90 07/13/2023 0958   MCH 29.3 07/13/2023 0958   MCHC 32.4 07/13/2023 0958   MCHC 34.2 08/26/2007 1112   RDW 14.4 07/13/2023 0958   Iron/TIBC/Ferritin/ %Sat No results found for: IRON, TIBC, FERRITIN, IRONPCTSAT Lipid Panel     Component Value Date/Time   CHOL 139 07/13/2023 0958   TRIG 70 07/13/2023 0958   HDL 68 07/13/2023 0958   CHOLHDL 2.7 04/30/2019 1324   LDLCALC 57 07/13/2023 0958   Hepatic Function Panel     Component Value Date/Time   PROT 6.4 07/13/2023 0958   ALBUMIN 3.8 (L) 07/13/2023 0958   AST 17 07/13/2023 0958   ALT 11 07/13/2023 0958   ALKPHOS 84 07/13/2023 0958   BILITOT 0.3  07/13/2023 0958      Component Value Date/Time   TSH 2.910 07/13/2023 0958     Assessment and Plan:   Primary hypertension Managed by PCP.  Taking Cozaar  25mg .  Continue to follow up with PCP  OSA (obstructive sleep apnea) Continue CPAP nightly  Class 3 severe obesity due to excess calories with body mass index (BMI) of 50.0 to 59.9 in adult        Obesity Treatment / Action Plan:  Patient will work on garnering support from family and friends to begin weight loss journey. Will work on eliminating or reducing the presence of highly palatable, calorie dense foods in the home. Will complete provided nutritional and psychosocial assessment questionnaire before the next appointment. Will be scheduled for indirect calorimetry to determine resting energy expenditure in a fasting state.  This will allow us  to create a reduced calorie, high-protein meal plan to promote loss of fat mass while preserving muscle mass. Counseled on the health benefits of losing 5%-15% of total body weight. Was counseled on nutritional approaches to weight loss and benefits of reducing processed foods and consuming plant-based foods and high quality protein as part of nutritional weight management. Was counseled on pharmacotherapy and role as an adjunct in weight management.   Obesity Education Performed Today:  She was weighed on the bioimpedance scale and results were discussed and documented in the synopsis.  We discussed obesity as a disease and the importance of a more detailed evaluation of all the factors contributing to the disease.  We discussed the importance of long term lifestyle changes which include nutrition, exercise and behavioral modifications as well as the importance of customizing this to her specific health and social needs.  We discussed the benefits of reaching a healthier weight to alleviate the symptoms of existing conditions and reduce the risks of the biomechanical, metabolic  and psychological effects of obesity.  Mary Hardin appears to be in the action stage of change and states they are ready to start intensive lifestyle modifications and behavioral modifications.  30 minutes was spent today on this visit including the above counseling, pre-visit chart review, and post-visit documentation.  Reviewed by clinician on day of visit: allergies, medications, problem list, medical history, surgical history, family history, social history, and previous encounter notes pertinent to obesity diagnosis.    Mary Hardin Mary Scoville FNP-C

## 2024-03-28 DIAGNOSIS — M170 Bilateral primary osteoarthritis of knee: Secondary | ICD-10-CM | POA: Insufficient documentation

## 2024-04-08 ENCOUNTER — Telehealth (INDEPENDENT_AMBULATORY_CARE_PROVIDER_SITE_OTHER): Admitting: Family Medicine

## 2024-04-08 VITALS — Wt 348.0 lb

## 2024-04-08 DIAGNOSIS — M5431 Sciatica, right side: Secondary | ICD-10-CM | POA: Diagnosis not present

## 2024-04-08 NOTE — Progress Notes (Signed)
 Wt (!) 348 lb (157.9 kg)   BMI 52.91 kg/m    Subjective:    Patient ID: Mary Hardin, female    DOB: 1956-06-12, 68 y.o.   MRN: [id number]  HPI: [name] is a 68 y.o. female  Chief Complaint  Patient presents with   Back Pain    Pt presents today for a 3week medications    BACK PAIN- Feeling much better on the 150mg  TID, seeing PT next week. Self decreased her medicine to 75mg  TID today  Duration: chronic, but better in the past couple of week Mechanism of injury: unknown Location: low back Onset: sudden Severity: mild Quality: shooting, aching, numb, tingling Frequency: intermittent Radiation: R leg below the knee Aggravating factors: sleeping Alleviating factors: higher dose of lyrica  Status: better Treatments attempted: lyrica , rest, ice, heat, APAP, ibuprofen, and aleve   Relief with NSAIDs?: no Nighttime pain:  no Paresthesias / decreased sensation:  yes Bowel / bladder incontinence:  no Fevers:  no Dysuria / urinary frequency:  no   Relevant past medical, surgical, family and social history reviewed and updated as indicated. Interim medical history since our last visit reviewed. Allergies and medications reviewed and updated.  Review of Systems  Constitutional: Negative.   Respiratory: Negative.    Cardiovascular: Negative.   Musculoskeletal:  Positive for back pain and myalgias. Negative for arthralgias, gait problem, joint swelling, neck pain and neck stiffness.  Skin: Negative.   Neurological: Negative.   Psychiatric/Behavioral: Negative.      Per HPI unless specifically indicated above     Objective:    Wt (!) 348 lb (157.9 kg)   BMI 52.91 kg/m   Wt Readings from Last 3 Encounters:  04/08/24 (!) 348 lb (157.9 kg)  03/25/24 (!) 354 lb (160.6 kg)  02/12/24 (!) 350 lb (158.8 kg)    Physical Exam Vitals and nursing note reviewed.  Constitutional:      General: She is not in acute distress.    Appearance: Normal appearance. She is  obese. She is not ill-appearing, toxic-appearing or diaphoretic.  HENT:     Head: Normocephalic and atraumatic.     Right Ear: External ear normal.     Left Ear: External ear normal.     Nose: Nose normal.     Mouth/Throat:     Mouth: Mucous membranes are moist.     Pharynx: Oropharynx is clear.  Eyes:     General: No scleral icterus.       Right eye: No discharge.        Left eye: No discharge.     Conjunctiva/sclera: Conjunctivae normal.     Pupils: Pupils are equal, round, and reactive to light.  Pulmonary:     Effort: Pulmonary effort is normal. No respiratory distress.     Comments: Speaking in full sentences Musculoskeletal:        General: Normal range of motion.     Cervical back: Normal range of motion.  Skin:    Coloration: Skin is not jaundiced or pale.     Findings: No bruising, erythema, lesion or rash.  Neurological:     Mental Status: She is alert and oriented to person, place, and time. Mental status is at baseline.  Psychiatric:        Mood and Affect: Mood normal.        Behavior: Behavior normal.        Thought Content: Thought content normal.        Judgment: Judgment  normal.     Results for orders placed or performed in visit on 07/31/23  HM DEXA SCAN   Collection Time: 10/09/17 12:00 AM  Result Value Ref Range   HM Dexa Scan Normal- see scanned document       Assessment & Plan:   Problem List Items Addressed This Visit   None Visit Diagnoses       Right sided sciatica    -  Primary   Will continue 150mg  TID of lyrica  and await PT. Follow up in 6 weeks and will consider lowering lyrica  at that time. Continue to monitor.        Follow up plan: Return in about 6 weeks (around 05/20/2024) for 6 weeks for virtual to check in on back, 3 months as physical .    This visit was completed via video visit through MyChart due to the restrictions of the COVID-19 pandemic. All issues as above were discussed and addressed. Physical exam was done as  above through visual confirmation on video through MyChart. If it was felt that the patient should be evaluated in the office, they were directed there. The patient verbally consented to this visit. Location of the patient: home Location of the provider: work Those involved with this call:  Provider: Duwaine Louder, DO CMA: York Fogo, CMA, Front Desk/Registration: Claretta Maiden  Time spent on call: 15 minutes with patient face to face via video conference. More than 50% of this time was spent in counseling and coordination of care. 23 minutes total spent in review of patient's record and preparation of their chart.

## 2024-04-08 NOTE — Progress Notes (Signed)
 Scheduled

## 2024-04-14 DIAGNOSIS — Z7409 Other reduced mobility: Secondary | ICD-10-CM | POA: Insufficient documentation

## 2024-04-17 ENCOUNTER — Ambulatory Visit (INDEPENDENT_AMBULATORY_CARE_PROVIDER_SITE_OTHER): Admitting: Family Medicine

## 2024-04-17 ENCOUNTER — Encounter: Payer: Self-pay | Admitting: Family Medicine

## 2024-04-17 VITALS — BP 136/84 | HR 68 | Temp 97.7°F | Ht 68.0 in | Wt 343.0 lb

## 2024-04-17 DIAGNOSIS — R0602 Shortness of breath: Secondary | ICD-10-CM | POA: Diagnosis not present

## 2024-04-17 DIAGNOSIS — R7309 Other abnormal glucose: Secondary | ICD-10-CM | POA: Diagnosis not present

## 2024-04-17 DIAGNOSIS — K760 Fatty (change of) liver, not elsewhere classified: Secondary | ICD-10-CM

## 2024-04-17 DIAGNOSIS — G894 Chronic pain syndrome: Secondary | ICD-10-CM

## 2024-04-17 DIAGNOSIS — R5383 Other fatigue: Secondary | ICD-10-CM

## 2024-04-17 DIAGNOSIS — E66813 Obesity, class 3: Secondary | ICD-10-CM

## 2024-04-17 DIAGNOSIS — Z6841 Body Mass Index (BMI) 40.0 and over, adult: Secondary | ICD-10-CM

## 2024-04-17 DIAGNOSIS — G4733 Obstructive sleep apnea (adult) (pediatric): Secondary | ICD-10-CM | POA: Diagnosis not present

## 2024-04-17 DIAGNOSIS — F339 Major depressive disorder, recurrent, unspecified: Secondary | ICD-10-CM

## 2024-04-17 NOTE — Progress Notes (Signed)
 At a Glance:  Vitals Temp: 97.7 F (36.5 C) BP: 136/84 Pulse Rate: 68 SpO2: 98 %   Anthropometric Measurements Height: 5' 8 (1.727 m) Weight: (!) 343 lb (155.6 kg) BMI (Calculated): 52.17 Starting Weight: 343lb Peak Weight: 380lb   Body Composition  Body Fat %: 58.1 % Fat Mass (lbs): 199.2 lbs Muscle Mass (lbs): 136.6 lbs Total Body Water (lbs): 105 lbs Visceral Fat Rating : 24   Other Clinical Data RMR: 1728 Fasting: Yes Labs: Yes Today's Visit #: 1 Starting Date: 04/17/24    EKG: Normal sinus rhythm, rate 69.  With prolonged PR interval of 272 ms, unchanged from previous EKG dated 07/07/2022  Indirect Calorimeter completed today shows a VO2 of 250 and a REE of 1728.  Her calculated basal metabolic rate is 7853 thus her basal metabolic rate is worse than expected.  Chief Complaint:  Obesity   Subjective:  Mary Hardin (MR# [id number]) is a 68 y.o. female who presents for evaluation and treatment of obesity and related comorbidities.   Mary Hardin is currently in the action stage of change and ready to dedicate time achieving and maintaining a healthier weight. Mary Hardin is interested in becoming our patient and working on intensive lifestyle modifications including (but not limited to) diet and exercise for weight loss.  Mary Hardin has been struggling with her weight. She has been unsuccessful in either losing weight, maintaining weight loss, or reaching her healthy weight goal.  She has been overweight since early childhood.  She lives with her 51 year old mom who is supportive.  She is retired from [employer].  She would like to weigh about 200 pounds.  She hates cooking and likes sweets.  She is snacking more on popcorn.  She has lost about 30 pounds since starting compounded semaglutide  at a max dose of 2.5 mg once weekly.  She denies GI upset.  She notes a history of OSA but has not had a sleep study done in quite some time, currently on CPAP nightly.   Exercise is limited due to chronic bilateral knee pain and back pain.  She is just starting with aqua PT.  She has previously declined the option for weight loss surgery.  She has nulligravida and postmenopausal.  Mary Hardin's habits were reviewed today and are as follows: she has significant food cravings issues, she snacks frequently in the evenings, she has problems with excessive hunger, and she struggles with emotional eating.   Other Fatigue Mary Hardin admits to daytime somnolence and admits to waking up still tired. Patient has a history of symptoms of daytime fatigue. Mary Hardin generally gets 8 hours of sleep per night, and states that she has nightime awakenings. Snoring is present. Apneic episodes are present. Epworth Sleepiness Score is 9.  She is on CPAP nightly for OSA  Shortness of Breath Mary Hardin notes increasing shortness of breath with exercising and seems to be worsening over time with weight gain. She notes getting out of breath sooner with activity than she used to. This has gotten worse recently. Mary Hardin denies shortness of breath at rest or orthopnea.   Depression Screen Mary Hardin's Food and Mood (modified PHQ-9) score was 13.     10/15/2023    3:56 PM  Depression screen PHQ 2/9  Decreased Interest 0  Down, Depressed, Hopeless 0  PHQ - 2 Score 0  Altered sleeping 0  Tired, decreased energy 0  Change in appetite 0  Feeling bad or failure about yourself  0  Trouble concentrating 0  Moving  slowly or fidgety/restless 0  Suicidal thoughts 0  PHQ-9 Score 0  Difficult doing work/chores Not difficult at all     Assessment and Plan:   Other Fatigue Mary Hardin does feel that her weight is causing her energy to be lower than it should be. Fatigue may be related to obesity, depression or many other causes. Labs will be ordered, and in the meanwhile, Mary Hardin will focus on self care including making healthy food choices, increasing physical activity and focusing on stress  reduction.  Shortness of Breath Mary Hardin does feel that she gets out of breath more easily that she used to when she exercises. Mary Hardin shortness of breath appears to be obesity related and exercise induced. She has agreed to work on weight loss and gradually increase exercise to treat her exercise induced shortness of breath. Will continue to monitor closely.  Mary Hardin had a positive depression screening. Depression is commonly associated with obesity and often results in emotional eating behaviors. We will monitor this closely and work on CBT to help improve the non-hunger eating patterns. Referral to Psychology may be required if no improvement is seen as she continues in our clinic.    Problem List Items Addressed This Visit     OSA (obstructive sleep apnea) She is on CPAP nightly for OSA noting several years since her last sleep study.  She is actively working on weight loss and is considering switching over to Zepbound  for moderate to severe OSA indication.  Referral made for pulmonology close to home.   Relevant Orders   Ambulatory referral to Pulmonology   Depression, recurrent She reports stable mood on Lexapro  20 mg once daily, managed by her PCP.  She has a good support system at home.  Continue to work on self-care, stress reduction and keeping junk food trigger foods out of the house.  Look for mood improvements with the addition of regular exercise.   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  She  denies GI side effects or meal skipping.  When she runs out of her current prescription, we discussed potential changes.      NAFLD (nonalcoholic fatty liver disease)    She notes a history of fatty liver disease.  Will calculate her fib 4 score after next visit once labs are completed      Chronic pain syndrome   Chronic back pain with sciatica and chronic bilateral knee pain have been barriers to her ability to exercise or weight loss.  She is starting aqua PT and is currently on Lyrica  150 mg 3 times daily.  She notes no additional weight gain with use of Lyrica .  She is on Mobic  15 mg daily for inflammation and pain.  Suggest checking out her local [employer] for water exercise options.       Mary Hardin is  currently in the action stage of change and her goal is to continue with weight loss efforts. I recommend Mary Hardin begin the structured treatment plan as follows:  She has agreed to Category 2 Plan with an additional 200 cal of snacks  Exercise goals: No exercise has been prescribed at this time.  Continue with aqua PT Looking at water exercise at the [facility] as future options  Behavioral modification strategies:increasing lean protein intake, increase H2O intake, decrease liquid calories, decrease ETOH, decreasing eating out, no skipping meals, better snacking choices, avoiding temptations, and planning for success  She was informed of the importance of frequent follow-up visits to maximize her success with intensive lifestyle modifications for her multiple health conditions. She was informed we would discuss her lab results at her next visit unless there is a critical issue that needs to be addressed sooner. Mary Hardin agreed to keep her next visit at the agreed upon time to discuss these results.  Objective:  General: Cooperative, alert, well developed, in no acute distress. HEENT: Conjunctivae and lids unremarkable. Cardiovascular: Regular rhythm.  Lungs: Normal work of breathing. Neurologic: No  focal deficits.   Lab Results  Component Value Date   CREATININE 0.85 07/13/2023   BUN 15 07/13/2023   NA 138 07/13/2023   K 4.2 07/13/2023   CL 102 07/13/2023   CO2 20 07/13/2023   Lab Results  Component Value Date   ALT 11 07/13/2023   AST 17 07/13/2023   ALKPHOS 84 07/13/2023   BILITOT 0.3 07/13/2023   Lab Results  Component Value Date   HGBA1C 5.4 07/13/2023   HGBA1C 5.4 10/27/2019   No results found for: INSULIN  Lab Results  Component Value Date   TSH 2.910 07/13/2023   Lab Results  Component Value Date   CHOL 139 07/13/2023   HDL 68 07/13/2023   LDLCALC 57 07/13/2023   TRIG 70 07/13/2023   CHOLHDL 2.7 04/30/2019   Lab Results  Component Value Date   WBC 5.7 07/13/2023   HGB 11.3 07/13/2023   HCT 34.9 07/13/2023   MCV 90 07/13/2023   PLT 248 07/13/2023   No results found for: IRON, TIBC, FERRITIN  Attestation Statements:  Reviewed by clinician on day of visit: allergies, medications, problem list, medical history, surgical history, family history, social history, and previous encounter notes.  Time spent on visit including pre-visit chart review and post-visit charting and face- to face care including nutritional counseling, review of EKG, interpretation of body composition scale and indirect calorimetry and nutrition prescription  was 42 minutes.   [name], D.O. DABFM, DABOM [facility] [address] [place], [place] [address] [phone]

## 2024-04-18 LAB — VITAMIN B12: Vitamin B-12: 106 pg/mL — ABNORMAL LOW (ref 232–1245)

## 2024-04-18 LAB — INSULIN, RANDOM: INSULIN: 11.9 u[IU]/mL (ref 2.6–24.9)

## 2024-04-18 LAB — T4, FREE: Free T4: 0.9 ng/dL (ref 0.82–1.77)

## 2024-04-18 LAB — CBC WITH DIFFERENTIAL/PLATELET
Basophils Absolute: 0 x10E3/uL (ref 0.0–0.2)
Basos: 1 %
EOS (ABSOLUTE): 0.4 x10E3/uL (ref 0.0–0.4)
Eos: 5 %
Hematocrit: 39.2 % (ref 34.0–46.6)
Hemoglobin: 12.1 g/dL (ref 11.1–15.9)
Immature Grans (Abs): 0 x10E3/uL (ref 0.0–0.1)
Immature Granulocytes: 0 %
Lymphocytes Absolute: 1.8 x10E3/uL (ref 0.7–3.1)
Lymphs: 22 %
MCH: 26.6 pg (ref 26.6–33.0)
MCHC: 30.9 g/dL — ABNORMAL LOW (ref 31.5–35.7)
MCV: 86 fL (ref 79–97)
Monocytes Absolute: 0.5 x10E3/uL (ref 0.1–0.9)
Monocytes: 6 %
Neutrophils Absolute: 5.4 x10E3/uL (ref 1.4–7.0)
Neutrophils: 66 %
Platelets: 283 x10E3/uL (ref 150–450)
RBC: 4.55 x10E6/uL (ref 3.77–5.28)
RDW: 13.9 % (ref 11.7–15.4)
WBC: 8.2 x10E3/uL (ref 3.4–10.8)

## 2024-04-18 LAB — FOLATE: Folate: 16.7 ng/mL (ref >3.0)

## 2024-04-18 LAB — COMPREHENSIVE METABOLIC PANEL WITH GFR
ALT: 13 IU/L (ref 0–32)
AST: 16 IU/L (ref 0–40)
Albumin: 4.2 g/dL (ref 3.9–4.9)
Alkaline Phosphatase: 100 IU/L (ref 49–135)
BUN/Creatinine Ratio: 19 (ref 12–28)
BUN: 16 mg/dL (ref 8–27)
Bilirubin Total: 0.3 mg/dL (ref 0.0–1.2)
CO2: 23 mmol/L (ref 20–29)
Calcium: 9.5 mg/dL (ref 8.7–10.3)
Chloride: 102 mmol/L (ref 96–106)
Creatinine, Ser: 0.83 mg/dL (ref 0.57–1.00)
Globulin, Total: 3.1 g/dL (ref 1.5–4.5)
Glucose: 87 mg/dL (ref 70–99)
Potassium: 4.9 mmol/L (ref 3.5–5.2)
Sodium: 141 mmol/L (ref 134–144)
Total Protein: 7.3 g/dL (ref 6.0–8.5)
eGFR: 78 mL/min/1.73 (ref >59)

## 2024-04-18 LAB — T3: T3, Total: 79 ng/dL (ref 71–180)

## 2024-04-18 LAB — TSH: TSH: 2.5 u[IU]/mL (ref 0.450–4.500)

## 2024-04-18 LAB — HEMOGLOBIN A1C
Est. average glucose Bld gHb Est-mCnc: 111 mg/dL
Hgb A1c MFr Bld: 5.5 % (ref 4.8–5.6)

## 2024-04-21 ENCOUNTER — Ambulatory Visit: Payer: Self-pay | Admitting: Family Medicine

## 2024-05-01 ENCOUNTER — Ambulatory Visit: Admitting: Family Medicine

## 2024-05-01 DIAGNOSIS — M5386 Other specified dorsopathies, lumbar region: Secondary | ICD-10-CM | POA: Insufficient documentation

## 2024-05-23 ENCOUNTER — Telehealth (INDEPENDENT_AMBULATORY_CARE_PROVIDER_SITE_OTHER): Admitting: Family Medicine

## 2024-05-23 VITALS — Ht 68.0 in | Wt 343.0 lb

## 2024-05-23 DIAGNOSIS — M5431 Sciatica, right side: Secondary | ICD-10-CM

## 2024-05-23 MED ORDER — PREGABALIN 200 MG PO CAPS: 200.0000 mg | ORAL_CAPSULE | Freq: Three times a day (TID) | ORAL | 0 refills | Status: AC

## 2024-05-23 MED ORDER — PREGABALIN 200 MG PO CAPS: 200.0000 mg | ORAL_CAPSULE | Freq: Two times a day (BID) | ORAL | 1 refills | Status: AC

## 2024-05-23 NOTE — Patient Instructions (Signed)
 Take 150 in the AM and PM for 1 week and 200mg  at bed time  Take 150 in the AM and 200mg  in the PM and bedtime for 1 week  Take 200mg  TID

## 2024-05-23 NOTE — Progress Notes (Signed)
 Ht 5' 8 (1.727 m)   Wt (!) 343 lb (155.6 kg)   BMI 52.15 kg/m    Subjective:    Patient ID: Mary Hardin, female    DOB: 13-Mar-1956, 68 y.o.   MRN: [id number]  HPI: Mary Hardin is a 68 y.o. female  Chief Complaint  Patient presents with   Back Pain    PT seems to have caused back pain to get worse. On vacation now for a couple weeks. Unable to do PT. It has been hard to get out of bed. Sharp shooting pain when first getting up and trying to get out of bed. Takes several hours to calm flare up down. Worse in the morning.    BACK PAIN Duration: chronic, worse in the past couple of weeks Mechanism of injury: unknown Location: bilateral and low back Onset: gradual Severity: severe Quality: aching, shooting, sore Frequency: constant Radiation: down her legs Aggravating factors: moving, lifting Alleviating factors: lyrica  Status: worse Treatments attempted: rest, ice, heat, APAP, ibuprofen, aleve , and physical therapy  Relief with NSAIDs?: no Nighttime pain:  no Paresthesias / decreased sensation:  no Bowel / bladder incontinence:  no Fevers:  no Dysuria / urinary frequency:  no  Relevant past medical, surgical, family and social history reviewed and updated as indicated. Interim medical history since our last visit reviewed. Allergies and medications reviewed and updated.  Review of Systems  Constitutional: Negative.   Respiratory: Negative.    Cardiovascular: Negative.   Musculoskeletal:  Positive for back pain and myalgias. Negative for arthralgias, gait problem, joint swelling, neck pain and neck stiffness.  Skin: Negative.   Neurological: Negative.   Psychiatric/Behavioral: Negative.      Per HPI unless specifically indicated above     Objective:    Ht 5' 8 (1.727 m)   Wt (!) 343 lb (155.6 kg)   BMI 52.15 kg/m   Wt Readings from Last 3 Encounters:  05/23/24 (!) 343 lb (155.6 kg)  04/17/24 (!) 343 lb (155.6 kg)  04/08/24 (!) 348 lb (157.9 kg)     Physical Exam Vitals and nursing note reviewed.  Constitutional:      General: She is not in acute distress.    Appearance: Normal appearance. She is not ill-appearing, toxic-appearing or diaphoretic.  HENT:     Head: Normocephalic and atraumatic.     Right Ear: External ear normal.     Left Ear: External ear normal.     Nose: Nose normal.     Mouth/Throat:     Mouth: Mucous membranes are moist.     Pharynx: Oropharynx is clear.  Eyes:     General: No scleral icterus.       Right eye: No discharge.        Left eye: No discharge.     Conjunctiva/sclera: Conjunctivae normal.     Pupils: Pupils are equal, round, and reactive to light.  Pulmonary:     Effort: Pulmonary effort is normal. No respiratory distress.     Comments: Speaking in full sentences Musculoskeletal:        General: Normal range of motion.     Cervical back: Normal range of motion.  Skin:    Coloration: Skin is not jaundiced or pale.     Findings: No bruising, erythema, lesion or rash.  Neurological:     Mental Status: She is alert and oriented to person, place, and time. Mental status is at baseline.  Psychiatric:  Mood and Affect: Mood normal.        Behavior: Behavior normal.        Thought Content: Thought content normal.        Judgment: Judgment normal.     Results for orders placed or performed in visit on 04/17/24  TSH   Collection Time: 04/17/24 10:33 AM  Result Value Ref Range   TSH 2.500 0.450 - 4.500 uIU/mL  T4, free   Collection Time: 04/17/24 10:33 AM  Result Value Ref Range   Free T4 0.90 0.82 - 1.77 ng/dL  T3   Collection Time: 04/17/24 10:33 AM  Result Value Ref Range   T3, Total 79 71 - 180 ng/dL  Insulin , random   Collection Time: 04/17/24 10:33 AM  Result Value Ref Range   INSULIN  11.9 2.6 - 24.9 uIU/mL  Hemoglobin A1c   Collection Time: 04/17/24 10:33 AM  Result Value Ref Range   Hgb A1c MFr Bld 5.5 4.8 - 5.6 %   Est. average glucose Bld gHb Est-mCnc 111 mg/dL   Folate   Collection Time: 04/17/24 10:33 AM  Result Value Ref Range   Folate 16.7 >3.0 ng/mL  Comprehensive metabolic panel with GFR   Collection Time: 04/17/24 10:33 AM  Result Value Ref Range   Glucose 87 70 - 99 mg/dL   BUN 16 8 - 27 mg/dL   Creatinine, Ser 9.16 0.57 - 1.00 mg/dL   eGFR 78 >40 fO/fpw/8.26   BUN/Creatinine Ratio 19 12 - 28   Sodium 141 134 - 144 mmol/L   Potassium 4.9 3.5 - 5.2 mmol/L   Chloride 102 96 - 106 mmol/L   CO2 23 20 - 29 mmol/L   Calcium 9.5 8.7 - 10.3 mg/dL   Total Protein 7.3 6.0 - 8.5 g/dL   Albumin 4.2 3.9 - 4.9 g/dL   Globulin, Total 3.1 1.5 - 4.5 g/dL   Bilirubin Total 0.3 0.0 - 1.2 mg/dL   Alkaline Phosphatase 100 49 - 135 IU/L   AST 16 0 - 40 IU/L   ALT 13 0 - 32 IU/L  Vitamin B12   Collection Time: 04/17/24 10:33 AM  Result Value Ref Range   Vitamin B-12 106 (L) 232 - 1,245 pg/mL  CBC with Differential/Platelet   Collection Time: 04/17/24 10:33 AM  Result Value Ref Range   WBC 8.2 3.4 - 10.8 x10E3/uL   RBC 4.55 3.77 - 5.28 x10E6/uL   Hemoglobin 12.1 11.1 - 15.9 g/dL   Hematocrit 60.7 65.9 - 46.6 %   MCV 86 79 - 97 fL   MCH 26.6 26.6 - 33.0 pg   MCHC 30.9 (L) 31.5 - 35.7 g/dL   RDW 86.0 88.2 - 84.5 %   Platelets 283 150 - 450 x10E3/uL   Neutrophils 66 Not Estab. %   Lymphs 22 Not Estab. %   Monocytes 6 Not Estab. %   Eos 5 Not Estab. %   Basos 1 Not Estab. %   Neutrophils Absolute 5.4 1.4 - 7.0 x10E3/uL   Lymphocytes Absolute 1.8 0.7 - 3.1 x10E3/uL   Monocytes Absolute 0.5 0.1 - 0.9 x10E3/uL   EOS (ABSOLUTE) 0.4 0.0 - 0.4 x10E3/uL   Basophils Absolute 0.0 0.0 - 0.2 x10E3/uL   Immature Granulocytes 0 Not Estab. %   Immature Grans (Abs) 0.0 0.0 - 0.1 x10E3/uL      Assessment & Plan:   Problem List Items Addressed This Visit   None Visit Diagnoses       Right sided sciatica    -  Primary   In exacerbation. Will increase her lyrica  to 200mg  TID and recheck in about 2 months. Call with any concerns.   Relevant  Medications   pregabalin  (LYRICA ) 200 MG capsule   pregabalin  (LYRICA ) 200 MG capsule (Start on 06/20/2024)        Follow up plan: Return for As scheduled.   This visit was completed via video visit through MyChart due to the restrictions of the COVID-19 pandemic. All issues as above were discussed and addressed. Physical exam was done as above through visual confirmation on video through MyChart. If it was felt that the patient should be evaluated in the office, they were directed there. The patient verbally consented to this visit. Location of the patient: home Location of the provider: work Those involved with this call:  Provider: Duwaine Louder, DO CMA: York Fogo, CMA, Front Desk/Registration: Claretta Maiden  Time spent on call: 15 minutes with patient face to face via video conference. More than 50% of this time was spent in counseling and coordination of care. 23 minutes total spent in review of patient's record and preparation of their chart.

## 2024-05-26 ENCOUNTER — Encounter: Payer: Self-pay | Admitting: Family Medicine

## 2024-05-30 ENCOUNTER — Telehealth: Payer: Self-pay | Admitting: Family Medicine

## 2024-05-30 NOTE — Telephone Encounter (Signed)
 Copied from CRM #[id number]. Topic: Clinical - Medication Question >> May 30, 2024 12:50 PM Delon DASEN wrote: Reason for CRM: Patient waiting for response about Lyrica - [phone]

## 2024-06-01 DIAGNOSIS — Z79899 Other long term (current) drug therapy: Secondary | ICD-10-CM | POA: Insufficient documentation

## 2024-06-01 DIAGNOSIS — G894 Chronic pain syndrome: Secondary | ICD-10-CM | POA: Insufficient documentation

## 2024-06-01 DIAGNOSIS — Z789 Other specified health status: Secondary | ICD-10-CM | POA: Insufficient documentation

## 2024-06-01 DIAGNOSIS — M899 Disorder of bone, unspecified: Secondary | ICD-10-CM | POA: Insufficient documentation

## 2024-06-01 NOTE — Patient Instructions (Signed)

## 2024-06-01 NOTE — Progress Notes (Unsigned)
 PROVIDER NOTE: Interpretation of information contained herein should be left to medically-trained personnel. Specific patient instructions are provided elsewhere under Patient Instructions section of medical record. This document was created in part using AI and STT-dictation technology, any transcriptional errors that may result from this process are unintentional.  Patient: Mary Hardin  Service: E/M Encounter  Provider: Eric DELENA Como, MD  DOB: 05-16-1956  Delivery: Face-to-face  Specialty: Interventional Pain Management  MRN: [id number]  Setting: Ambulatory outpatient facility  Specialty designation: 09  Type: New Patient  Location: Outpatient office facility  PCP: Vicci Duwaine SQUIBB, DO  DOS: 06/02/2024    Referring Prov.: Burnetta Aures, MD   Primary Reason(s) for Visit: Encounter for initial evaluation of one or more chronic problems (new to examiner) potentially causing chronic pain, and posing a threat to normal musculoskeletal function. (Level of risk: High) CC: No chief complaint on file.  HPI  [name] is a 68 y.o. year old, female patient, who comes for the first time to our practice referred by Burnetta Aures, MD for our initial evaluation of her chronic pain. She has Allergic rhinitis; Asthma; ALLERGY, FOOD, HX OF; Family history of malignant neoplasm of breast; Acid reflux; Apnea, sleep; HTN (hypertension); OSA (obstructive sleep apnea); Depression, recurrent; BMI 50.0-59.9, adult (HCC); Anxiety; Atherosclerosis of aorta; Osteoarthritis; Advance directive discussed with patient; Senile purpura; Morbid obesity (HCC); Decreased ROM of intervertebral discs of lumbar spine; Impaired functional mobility, balance, gait, and endurance; Osteoarthritis of both knees; Pain of lumbar spine; Primary osteoarthritis of both knees; Morbid (severe) obesity due to excess calories (HCC); Chronic pain syndrome; Pharmacologic therapy; Disorder of skeletal system; and Problems influencing health  status on their problem list. Today she comes in for evaluation of her No chief complaint on file.  Pain Assessment: Location:     Radiating:   Onset:   Duration:   Quality:   Severity:  /10 (subjective, self-reported pain score)  Effect on ADL:   Timing:   Modifying factors:   BP:    HR:    Onset and Duration: {Hx; Onset and Duration:210120511} Cause of pain: {Hx; Cause:210120521} Severity: {Pain Severity:210120502} Timing: {Symptoms; Timing:210120501} Aggravating Factors: {Causes; Aggravating pain factors:210120507} Alleviating Factors: {Causes; Alleviating Factors:210120500} Associated Problems: {Hx; Associated problems:210120515} Quality of Pain: {Hx; Symptom quality or Descriptor:210120531} Previous Examinations or Tests: {Hx; Previous examinations or test:210120529} Previous Treatments: {Hx; Previous Treatment:210120503}  [name] is being evaluated for possible interventional pain management therapies for the treatment of her chronic pain.  Discussed the use of AI scribe software for clinical note transcription with the patient, who gave verbal consent to proceed.  History of Present Illness            [name] has been informed that this initial visit was an evaluation only.  On the follow up appointment I will go over the results, including ordered tests and available interventional therapies. At that time she will have the opportunity to decide whether to proceed with offered therapies or not. In the event that [name] prefers avoiding interventional options, this will conclude our involvement in the case.  Medication management recommendations may be provided upon request.  Patient informed that diagnostic tests may be ordered to assist in identifying underlying causes, narrow the list of differential diagnoses and aid in determining candidacy for (or contraindications to) planned therapeutic interventions.  Historic Controlled Substance Pharmacotherapy  Review PMP and historical list of controlled substances: Pregabalin  200 Mg Capsule ,  Lorazepam  1 Mg Tablet ,  Most  recently prescribed controlled substance(s): Opioid Analgesic: *** MME/day: *** mg/day  Historical Monitoring: The patient  reports no history of drug use. List of prior UDS Testing: No results found for: MDMA, COCAINSCRNUR, PCPSCRNUR, PCPQUANT, CANNABQUANT, THCU, ETH, CBDTHCR, D8THCCBX, D9THCCBX Historical Background Evaluation: [place] PMP: PDMP reviewed during this encounter. Review of the past 75-months conducted.             PMP NARX Score Report:  Narcotic: 160 Sedative: 371 Stimulant: 000 [place] Department of public safety, offender search: Engineer, [employer] Information) Non-contributory Risk Assessment Profile: Aberrant behavior: None observed or detected today Risk factors for fatal opioid overdose: None identified today PMP NARX Overdose Risk Score: 120 Fatal overdose hazard ratio (HR): Calculation deferred Non-fatal overdose hazard ratio (HR): Calculation deferred Risk of opioid abuse or dependence: 0.7-3.0% with doses <= 36 MME/day and 6.1-26% with doses >= 120 MME/day. Substance use disorder (SUD) risk level: See below Personal History of Substance Abuse (SUD-Substance use disorder):  Alcohol:    Illegal Drugs:    Rx Drugs:    ORT Risk Level calculation:    ORT Scoring interpretation table:  Score <3 = Low Risk for SUD  Score between 4-7 = Moderate Risk for SUD  Score >8 = High Risk for Opioid Abuse   PHQ-2 Depression Scale:  Total score:    PHQ-2 Scoring interpretation table: (Score and probability of major depressive disorder)  Score 0 = No depression  Score 1 = 15.4% Probability  Score 2 = 21.1% Probability  Score 3 = 38.4% Probability  Score 4 = 45.5% Probability  Score 5 = 56.4% Probability  Score 6 = 78.6% Probability   PHQ-9 Depression Scale:  Total score:    PHQ-9 Scoring interpretation table:  Score 0-4 = No depression  Score 5-9 =  Mild depression  Score 10-14 = Moderate depression  Score 15-19 = Moderately severe depression  Score 20-27 = Severe depression (2.4 times higher risk of SUD and 2.89 times higher risk of overuse)   Pharmacologic Plan: As per protocol, I have not taken over any controlled substance management, pending the results of ordered tests and/or consults.            Initial impression: Pending review of available data and ordered tests.  Meds   Current Outpatient Medications:    baclofen  (LIORESAL ) 10 MG tablet, Take 0.5-1 tablets (5-10 mg total) by mouth at bedtime as needed., Disp: 30 each, Rfl: 0   Cholecalciferol (VITAMIN D3) 5000 units CAPS, Take 5,000 Units by mouth daily., Disp: , Rfl:    docusate sodium (COLACE) 100 MG capsule, Take 100 mg by mouth daily., Disp: , Rfl:    EPINEPHrine  (EPIPEN  2-PAK) 0.3 mg/0.3 mL IJ SOAJ injection, Inject 0.3 mg into the muscle as needed for anaphylaxis., Disp: 1 each, Rfl: 12   escitalopram  (LEXAPRO ) 20 MG tablet, Take 1 tablet (20 mg total) by mouth daily., Disp: 90 tablet, Rfl: 1   LORazepam  (ATIVAN ) 1 MG tablet, Take 1 tablet (1 mg total) by mouth daily as needed for anxiety., Disp: 30 tablet, Rfl: 0   losartan  (COZAAR ) 25 MG tablet, Take 1 tablet (25 mg total) by mouth daily., Disp: 90 tablet, Rfl: 1   meloxicam  (MOBIC ) 15 MG tablet, Take 1 tablet (15 mg total) by mouth daily., Disp: 90 tablet, Rfl: 1   pregabalin  (LYRICA ) 200 MG capsule, Take 1 capsule (200 mg total) by mouth 3 (three) times daily., Disp: 90 capsule, Rfl: 0   [START ON 06/20/2024] pregabalin  (LYRICA ) 200 MG  capsule, Take 1 capsule (200 mg total) by mouth 2 (two) times daily., Disp: 90 capsule, Rfl: 1   valACYclovir (VALTREX) 1000 MG tablet, Take 1 tablet every 12 hours by oral route as needed., Disp: , Rfl:   Imaging Review  Thoracic Imaging: Thoracic DG w/swimmers view: Results for orders placed during the hospital encounter of 04/28/22 DG Thoracic Spine  W/Swimmers  Narrative CLINICAL DATA:  Back pain  EXAM: THORACIC SPINE - 3 VIEWS  COMPARISON:  None Available.  FINDINGS: No recent fracture is seen. Alignment of posterior margins of vertebral bodies is within normal limits. Degenerative changes are noted with bony spurs in mid and lower thoracic spine. Paraspinal soft tissues are unremarkable.  IMPRESSION: No recent fracture is seen in thoracic spine. Degenerative changes are noted with bony spurs in mid and lower thoracic spine.   Electronically Signed By: Gearldine Mary M.D. On: 04/30/2022 20:59  Lumbosacral Imaging: Lumbar DG (Complete) 4+V: Results for orders placed during the hospital encounter of 04/28/22 DG Lumbar Spine Complete  Narrative CLINICAL DATA:  Back pain  EXAM: LUMBAR SPINE - COMPLETE 4+ VIEW  COMPARISON:  None Available.  FINDINGS: There is mild levoscoliosis. Oblique views are less than optimal. No recent fracture is seen. Alignment of posterior margins of vertebral bodies is within normal limits. Degenerative changes are noted with small anterior bony spurs and facet hypertrophy. There is disc space narrowing at L4-L5 and L5-S1 levels. Surgical clips are seen in right upper quadrant.  IMPRESSION: No recent fracture is seen. Lumbar spondylosis, more severe at L4-L5 and L5-S1 levels.   Electronically Signed By: Gearldine Mary M.D. On: 04/30/2022 20:57  Complexity Note: Imaging results reviewed.                         ROS  Cardiovascular: {Hx; Cardiovascular History:210120525} Pulmonary or Respiratory: {Hx; Pumonary and/or Respiratory History:210120523} Neurological: {Hx; Neurological:210120504} Psychological-Psychiatric: {Hx; Psychological-Psychiatric History:210120512} Gastrointestinal: {Hx; Gastrointestinal:210120527} Genitourinary: {Hx; Genitourinary:210120506} Hematological: {Hx; Hematological:210120510} Endocrine: {Hx; Endocrine history:210120509} Rheumatologic:  {Hx; Rheumatological:210120530} Musculoskeletal: {Hx; Musculoskeletal:210120528} Work History: {Hx; Work history:210120514}  Allergies  Ms. Pennino is allergic to shellfish allergy and shellfish protein-containing drug products.  Laboratory Chemistry Profile   Renal Lab Results  Component Value Date   BUN 16 04/17/2024   CREATININE 0.83 04/17/2024   BCR 19 04/17/2024   GFRAA 105 06/24/2020   GFRNONAA 91 06/24/2020   SPECGRAV 1.015 07/07/2022   PHUR 7.0 07/07/2022   PROTEINUR Negative 07/07/2022     Electrolytes Lab Results  Component Value Date   NA 141 04/17/2024   K 4.9 04/17/2024   CL 102 04/17/2024   CALCIUM 9.5 04/17/2024     Hepatic Lab Results  Component Value Date   AST 16 04/17/2024   ALT 13 04/17/2024   ALBUMIN 4.2 04/17/2024   ALKPHOS 100 04/17/2024     ID Lab Results  Component Value Date   HIV Non Reactive 10/27/2019     Bone No results found for: VD25OH, CI874NY7UNU, CI6874NY7, CI7874NY7, 25OHVITD1, 25OHVITD2, 25OHVITD3, TESTOFREE, TESTOSTERONE   Endocrine Lab Results  Component Value Date   GLUCOSE 87 04/17/2024   GLUCOSEU Negative 07/07/2022   HGBA1C 5.5 04/17/2024   TSH 2.500 04/17/2024   FREET4 0.90 04/17/2024     Neuropathy Lab Results  Component Value Date   VITAMINB12 106 (L) 04/17/2024   FOLATE 16.7 04/17/2024   HGBA1C 5.5 04/17/2024   HIV Non Reactive 10/27/2019     CNS No results found for: COLORCSF,  APPEARCSF, RBCCOUNTCSF, WBCCSF, POLYSCSF, LYMPHSCSF, EOSCSF, PROTEINCSF, GLUCCSF, JCVIRUS, CSFOLI, IGGCSF, LABACHR, ACETBL   Inflammation (CRP: Acute  ESR: Chronic) No results found for: CRP, ESRSEDRATE, LATICACIDVEN   Rheumatology No results found for: RF, ANA, LABURIC, URICUR, LYMEIGGIGMAB, LYMEABIGMQN, HLAB27   Coagulation Lab Results  Component Value Date   PLT 283 04/17/2024     Cardiovascular Lab Results  Component Value Date   HGB 12.1  04/17/2024   HCT 39.2 04/17/2024     Screening Lab Results  Component Value Date   HIV Non Reactive 10/27/2019     Cancer No results found for: CEA, CA125, LABCA2   Allergens No results found for: ALMOND, APPLE, ASPARAGUS, AVOCADO, BANANA, BARLEY, BASIL, BAYLEAF, GREENBEAN, LIMABEAN, WHITEBEAN, BEEFIGE, REDBEET, BLUEBERRY, BROCCOLI, CABBAGE, MELON, CARROT, CASEIN, CASHEWNUT, CAULIFLOWER, CELERY     Note: Lab results reviewed.  PFSH  Drug: Ms. Ormand  reports no history of drug use. Alcohol:  reports current alcohol use. Tobacco:  reports that she has never smoked. She has never used smokeless tobacco. Medical:  has a past medical history of Anemia, Anxiety, Arthritis, Asthma, Depression, Family history of adverse reaction to anesthesia, Family history of malignant neoplasm of breast, Fatty liver, GERD (gastroesophageal reflux disease), Headache, Hernia of abdominal wall, Hypertension, Joint pain, Multiple food allergies, PONV (postoperative nausea and vomiting), and Sleep apnea. Family: family history includes Arrhythmia in her mother; Breast cancer in her paternal aunt; Breast cancer (age of onset: 16) in her maternal aunt; Breast cancer (age of onset: 93) in her maternal grandmother; Breast cancer (age of onset: 20) in her mother; Cancer in her brother; Diabetes in her maternal grandmother; Heart disease in her mother; Hyperlipidemia in her father; Hypertension in her father and mother; Pancreatic cancer (age of onset: 97) in her paternal grandmother; Seizures in her father; Stroke in her father; Thyroid cancer in her cousin; Thyroid disease in her mother.  Past Surgical History:  Procedure Laterality Date   CHOLECYSTECTOMY     COLONOSCOPY N/A 08/31/2017   Procedure: COLONOSCOPY;  Surgeon: Lennard Lesta FALCON, MD;  Location: [facility] ENDOSCOPY;  Service: Endoscopy;  Laterality: N/A;   COLONOSCOPY W/ BIOPSIES AND POLYPECTOMY     DILATION AND  CURETTAGE OF UTERUS     x2   ESOPHAGOGASTRODUODENOSCOPY N/A 08/31/2017   Procedure: ESOPHAGOGASTRODUODENOSCOPY (EGD);  Surgeon: Lennard Lesta FALCON, MD;  Location: [facility] ENDOSCOPY;  Service: Endoscopy;  Laterality: N/A;   EYE SURGERY     lasik   VARICOSE VEIN SURGERY     wisdom teeth extraction     Active Ambulatory Problems    Diagnosis Date Noted   Allergic rhinitis 10/23/2007   Asthma 10/23/2007   ALLERGY, FOOD, HX OF 10/23/2007   Family history of malignant neoplasm of breast    Acid reflux 08/12/2013   Apnea, sleep 08/12/2013   HTN (hypertension) 05/10/2015   OSA (obstructive sleep apnea) 05/10/2015   Depression, recurrent 05/10/2015   BMI 50.0-59.9, adult (HCC) 05/10/2015   Anxiety 05/11/2015   Atherosclerosis of aorta 05/14/2019   Osteoarthritis 06/28/2022   Advance directive discussed with patient 07/07/2022   Senile purpura 07/07/2022   Morbid obesity (HCC) 07/07/2022   Decreased ROM of intervertebral discs of lumbar spine 05/01/2024   Impaired functional mobility, balance, gait, and endurance 04/14/2024   Osteoarthritis of both knees 03/06/2023   Pain of lumbar spine 03/11/2024   Primary osteoarthritis of both knees 03/28/2024   Morbid (severe) obesity due to excess calories (HCC) 03/11/2024   Chronic pain syndrome 06/01/2024   Pharmacologic  therapy 06/01/2024   Disorder of skeletal system 06/01/2024   Problems influencing health status 06/01/2024   Resolved Ambulatory Problems    Diagnosis Date Noted   Chest pain 08/03/2014   Palpitations 05/16/2016   Symptomatic PVCs 06/14/2016   Nausea 04/24/2019   Past Medical History:  Diagnosis Date   Anemia    Arthritis    Depression    Family history of adverse reaction to anesthesia    Fatty liver    GERD (gastroesophageal reflux disease)    Headache    Hernia of abdominal wall    Hypertension    Joint pain    Multiple food allergies    PONV (postoperative nausea and vomiting)    Sleep apnea    Constitutional Exam   General appearance: Well nourished, well developed, and well hydrated. In no apparent acute distress There were no vitals filed for this visit. BMI Assessment: Estimated body mass index is 52.15 kg/m as calculated from the following:   Height as of 05/23/24: 5' 8 (1.727 m).   Weight as of 05/23/24: 343 lb (155.6 kg).  BMI interpretation table: BMI level Category Range association with higher incidence of chronic pain  <18 kg/m2 Underweight   18.5-24.9 kg/m2 Ideal body weight   25-29.9 kg/m2 Overweight Increased incidence by 20%  30-34.9 kg/m2 Obese (Class I) Increased incidence by 68%  35-39.9 kg/m2 Severe obesity (Class II) Increased incidence by 136%  >40 kg/m2 Extreme obesity (Class III) Increased incidence by 254%   Patient's current BMI Ideal Body weight  There is no height or weight on file to calculate BMI. Ideal body weight: 63.9 kg (140 lb 14 oz) Adjusted ideal body weight: 100.6 kg (221 lb 11.6 oz)   BMI Readings from Last 4 Encounters:  05/23/24 52.15 kg/m  04/17/24 52.15 kg/m  04/08/24 52.91 kg/m  03/25/24 53.83 kg/m   Wt Readings from Last 4 Encounters:  05/23/24 (!) 343 lb (155.6 kg)  04/17/24 (!) 343 lb (155.6 kg)  04/08/24 (!) 348 lb (157.9 kg)  03/25/24 (!) 354 lb (160.6 kg)    Psych/Mental status: Alert, oriented x 3 (person, place, & time)       Eyes: PERLA Respiratory: No evidence of acute respiratory distress  Assessment  Primary Diagnosis & Pertinent Problem List: The primary encounter diagnosis was Chronic pain syndrome. Diagnoses of Pharmacologic therapy, Disorder of skeletal system, and Problems influencing health status were also pertinent to this visit.  Visit Diagnosis (New problems to examiner): 1. Chronic pain syndrome   2. Pharmacologic therapy   3. Disorder of skeletal system   4. Problems influencing health status    Plan of Care (Initial workup plan)  Note: Ms. Jeune was reminded that as per protocol, today's visit has been an  evaluation only. We have not taken over the patient's controlled substance management.  Problem-specific plan: Assessment and Plan            Lab Orders  No laboratory test(s) ordered today   Imaging Orders  No imaging studies ordered today   Referral Orders  No referral(s) requested today   Procedure Orders    No procedure(s) ordered today   Pharmacotherapy (current): Medications ordered:  No orders of the defined types were placed in this encounter.  Medications administered during this visit: Mary EMERSON Nelwyn Holley had no medications administered during this visit.   Analgesic Pharmacotherapy:  Opioid Analgesics: For patients currently taking or requesting to take opioid analgesics, in accordance with [place]  Medical Board Guidelines,  we will assess their risks and indications for the use of these substances. After completing our evaluation, we may offer recommendations, but we no longer take patients for medication management. The prescribing physician will ultimately decide, based on his/her training and level of comfort whether to adopt any of the recommendations, including whether or not to prescribe such medicines.  Membrane stabilizer: To be determined at a later time  Muscle relaxant: To be determined at a later time  NSAID: To be determined at a later time  Other analgesic(s): To be determined at a later time   Interventional management options: Ms. Cantave was informed that there is no guarantee that she would be a candidate for interventional therapies. The decision will be based on the results of diagnostic studies, as well as Ms. Whitt risk profile.  Procedure(s) under consideration:  Pending results of ordered studies     Interventional Therapies  Risk Factors  Considerations  Medical Comorbidities:     Planned  Pending:      Under consideration:   Pending   Completed: (Analgesic benefit)1  None at this time   Therapeutic   Palliative (PRN) options:   None established   Completed by other providers:   None reported  1(Analgesic benefit): Expressed in percentage (%). (Local anesthetic[LA] +/- sedation  L.A.Local Anesthetic  Steroid benefit  Ongoing benefit)   Provider-requested follow-up: No follow-ups on file.  Future Appointments  Date Time Provider Department Center  06/02/2024 11:00 AM Tanya Glisson, MD [facility] None  06/09/2024 11:00 AM Waylan, Darice BRAVO, DO [facility] None  07/29/2024  9:20 AM [facility]AL WELLNESS VISIT [facility] [address]  08/01/2024 10:40 AM Vicci Duwaine SQUIBB, DO [facility] [address]   I discussed the assessment and treatment plan with the patient. The patient was provided an opportunity to ask questions and all were answered. The patient agreed with the plan and demonstrated an understanding of the instructions.  Patient advised to call back or seek an in-person evaluation if the symptoms or condition worsens.  Duration of encounter: *** minutes.  Total time on encounter, as per AMA guidelines included both the face-to-face and non-face-to-face time personally spent by the physician and/or other qualified health care professional(s) on the day of the encounter (includes time in activities that require the physician or other qualified health care professional and does not include time in activities normally performed by clinical staff). Physician's time may include the following activities when performed: Preparing to see the patient (e.g., pre-charting review of records, searching for previously ordered imaging, lab work, and nerve conduction tests) Review of prior analgesic pharmacotherapies. Reviewing PMP Interpreting ordered tests (e.g., lab work, imaging, nerve conduction tests) Performing post-procedure evaluations, including interpretation of diagnostic procedures Obtaining and/or reviewing separately obtained history Performing a medically appropriate examination and/or  evaluation Counseling and educating the patient/family/caregiver Ordering medications, tests, or procedures Referring and communicating with other health care professionals (when not separately reported) Documenting clinical information in the electronic or other health record Independently interpreting results (not separately reported) and communicating results to the patient/ family/caregiver Care coordination (not separately reported)  Note by: Glisson DELENA Tanya, MD (TTS and AI technology used. I apologize for any typographical errors that were not detected and corrected.) Date: 06/02/2024; Time: 4:08 PM

## 2024-06-02 ENCOUNTER — Encounter: Payer: Self-pay | Admitting: Pain Medicine

## 2024-06-02 ENCOUNTER — Ambulatory Visit: Attending: Pain Medicine | Admitting: Pain Medicine

## 2024-06-02 VITALS — BP 137/54 | HR 71 | Temp 97.1°F | Resp 16 | Ht 68.0 in | Wt 350.0 lb

## 2024-06-02 DIAGNOSIS — M79604 Pain in right leg: Secondary | ICD-10-CM | POA: Insufficient documentation

## 2024-06-02 DIAGNOSIS — G8929 Other chronic pain: Secondary | ICD-10-CM | POA: Diagnosis present

## 2024-06-02 DIAGNOSIS — M899 Disorder of bone, unspecified: Secondary | ICD-10-CM | POA: Diagnosis present

## 2024-06-02 DIAGNOSIS — M25551 Pain in right hip: Secondary | ICD-10-CM | POA: Diagnosis not present

## 2024-06-02 DIAGNOSIS — G4733 Obstructive sleep apnea (adult) (pediatric): Secondary | ICD-10-CM | POA: Diagnosis present

## 2024-06-02 DIAGNOSIS — G894 Chronic pain syndrome: Secondary | ICD-10-CM | POA: Insufficient documentation

## 2024-06-02 DIAGNOSIS — Z79899 Other long term (current) drug therapy: Secondary | ICD-10-CM | POA: Diagnosis present

## 2024-06-02 DIAGNOSIS — M25561 Pain in right knee: Secondary | ICD-10-CM | POA: Insufficient documentation

## 2024-06-02 DIAGNOSIS — M47816 Spondylosis without myelopathy or radiculopathy, lumbar region: Secondary | ICD-10-CM | POA: Insufficient documentation

## 2024-06-02 DIAGNOSIS — M5459 Other low back pain: Secondary | ICD-10-CM | POA: Insufficient documentation

## 2024-06-02 DIAGNOSIS — Z789 Other specified health status: Secondary | ICD-10-CM | POA: Diagnosis present

## 2024-06-02 DIAGNOSIS — Z6841 Body Mass Index (BMI) 40.0 and over, adult: Secondary | ICD-10-CM | POA: Insufficient documentation

## 2024-06-02 DIAGNOSIS — M5441 Lumbago with sciatica, right side: Secondary | ICD-10-CM | POA: Diagnosis not present

## 2024-06-02 DIAGNOSIS — M25562 Pain in left knee: Secondary | ICD-10-CM | POA: Diagnosis present

## 2024-06-02 NOTE — Telephone Encounter (Signed)
 Routing to provider to advise. Please see mychart message from the patient regarding Lyrica .

## 2024-06-02 NOTE — Progress Notes (Signed)
 Safety precautions to be maintained throughout the outpatient stay will include: orient to surroundings, keep bed in low position, maintain call bell within reach at all times, provide assistance with transfer out of bed and ambulation.

## 2024-06-03 ENCOUNTER — Encounter: Payer: Self-pay | Admitting: Pain Medicine

## 2024-06-03 ENCOUNTER — Ambulatory Visit
Admission: RE | Admit: 2024-06-03 | Discharge: 2024-06-03 | Disposition: A | Discharge status: Home / Self Care | Source: Ambulatory Visit | Attending: Pain Medicine | Admitting: Pain Medicine

## 2024-06-03 ENCOUNTER — Ambulatory Visit
Admission: RE | Admit: 2024-06-03 | Discharge: 2024-06-03 | Disposition: A | Source: Ambulatory Visit | Attending: Pain Medicine | Admitting: Pain Medicine

## 2024-06-03 DIAGNOSIS — M5441 Lumbago with sciatica, right side: Secondary | ICD-10-CM | POA: Diagnosis present

## 2024-06-03 DIAGNOSIS — M79604 Pain in right leg: Secondary | ICD-10-CM | POA: Diagnosis present

## 2024-06-03 DIAGNOSIS — M25561 Pain in right knee: Secondary | ICD-10-CM | POA: Insufficient documentation

## 2024-06-03 DIAGNOSIS — M5459 Other low back pain: Secondary | ICD-10-CM | POA: Insufficient documentation

## 2024-06-03 DIAGNOSIS — M25562 Pain in left knee: Secondary | ICD-10-CM | POA: Insufficient documentation

## 2024-06-03 DIAGNOSIS — M25551 Pain in right hip: Secondary | ICD-10-CM | POA: Insufficient documentation

## 2024-06-03 DIAGNOSIS — M47816 Spondylosis without myelopathy or radiculopathy, lumbar region: Secondary | ICD-10-CM | POA: Insufficient documentation

## 2024-06-03 DIAGNOSIS — G8929 Other chronic pain: Secondary | ICD-10-CM

## 2024-06-06 LAB — COMPLIANCE DRUG ANALYSIS, UR

## 2024-06-09 ENCOUNTER — Ambulatory Visit: Attending: Pain Medicine | Admitting: Pain Medicine

## 2024-06-09 ENCOUNTER — Ambulatory Visit: Admitting: Family Medicine

## 2024-06-09 ENCOUNTER — Encounter: Payer: Self-pay | Admitting: Pain Medicine

## 2024-06-09 VITALS — BP 131/76 | HR 67 | Temp 97.3°F | Resp 16 | Ht 68.0 in | Wt 350.0 lb

## 2024-06-09 DIAGNOSIS — G8929 Other chronic pain: Secondary | ICD-10-CM | POA: Insufficient documentation

## 2024-06-09 DIAGNOSIS — Z6841 Body Mass Index (BMI) 40.0 and over, adult: Secondary | ICD-10-CM | POA: Diagnosis present

## 2024-06-09 DIAGNOSIS — M51369 Other intervertebral disc degeneration, lumbar region without mention of lumbar back pain or lower extremity pain: Secondary | ICD-10-CM | POA: Diagnosis present

## 2024-06-09 DIAGNOSIS — M25561 Pain in right knee: Secondary | ICD-10-CM | POA: Insufficient documentation

## 2024-06-09 DIAGNOSIS — M25551 Pain in right hip: Secondary | ICD-10-CM | POA: Insufficient documentation

## 2024-06-09 DIAGNOSIS — M170 Bilateral primary osteoarthritis of knee: Secondary | ICD-10-CM | POA: Diagnosis present

## 2024-06-09 DIAGNOSIS — M5459 Other low back pain: Secondary | ICD-10-CM | POA: Insufficient documentation

## 2024-06-09 DIAGNOSIS — R700 Elevated erythrocyte sedimentation rate: Secondary | ICD-10-CM | POA: Insufficient documentation

## 2024-06-09 DIAGNOSIS — R7982 Elevated C-reactive protein (CRP): Secondary | ICD-10-CM | POA: Diagnosis present

## 2024-06-09 DIAGNOSIS — M4316 Spondylolisthesis, lumbar region: Secondary | ICD-10-CM | POA: Insufficient documentation

## 2024-06-09 DIAGNOSIS — M5134 Other intervertebral disc degeneration, thoracic region: Secondary | ICD-10-CM | POA: Diagnosis present

## 2024-06-09 DIAGNOSIS — M150 Primary generalized (osteo)arthritis: Secondary | ICD-10-CM | POA: Insufficient documentation

## 2024-06-09 DIAGNOSIS — E662 Morbid (severe) obesity with alveolar hypoventilation: Secondary | ICD-10-CM | POA: Insufficient documentation

## 2024-06-09 DIAGNOSIS — M79604 Pain in right leg: Secondary | ICD-10-CM | POA: Insufficient documentation

## 2024-06-09 DIAGNOSIS — M8580 Other specified disorders of bone density and structure, unspecified site: Secondary | ICD-10-CM | POA: Insufficient documentation

## 2024-06-09 DIAGNOSIS — Z7409 Other reduced mobility: Secondary | ICD-10-CM | POA: Insufficient documentation

## 2024-06-09 DIAGNOSIS — E66813 Obesity, class 3: Secondary | ICD-10-CM | POA: Diagnosis present

## 2024-06-09 DIAGNOSIS — M5441 Lumbago with sciatica, right side: Secondary | ICD-10-CM | POA: Diagnosis present

## 2024-06-09 DIAGNOSIS — M25562 Pain in left knee: Secondary | ICD-10-CM | POA: Diagnosis present

## 2024-06-09 DIAGNOSIS — M47816 Spondylosis without myelopathy or radiculopathy, lumbar region: Secondary | ICD-10-CM | POA: Diagnosis present

## 2024-06-09 DIAGNOSIS — M545 Low back pain, unspecified: Secondary | ICD-10-CM | POA: Insufficient documentation

## 2024-06-09 NOTE — Progress Notes (Signed)
 PROVIDER NOTE: Interpretation of information contained herein should be left to medically-trained personnel. Specific patient instructions are provided elsewhere under Patient Instructions section of medical record. This document was created in part using AI and STT-dictation technology, any transcriptional errors that may result from this process are unintentional.  Patient: Mary Hardin  Service: E/M   PCP: Vicci Duwaine SQUIBB, DO  DOB: 10/12/1955  DOS: 06/09/2024  Provider: Eric DELENA Como, MD  MRN: [id number]  Delivery: Face-to-face  Specialty: Interventional Pain Management  Type: Established Patient  Setting: Ambulatory outpatient facility  Specialty designation: 09  Referring Prov.: Vicci Duwaine SQUIBB, DO  Location: Outpatient office facility       Primary Reason(s) for Visit: Encounter for evaluation before starting new chronic pain management plan of care (Level of risk: moderate) CC: Back Pain, Hip Pain, and Knee Pain  HPI  Mary Hardin is a 68 y.o. year old, female patient, who comes today for a follow-up evaluation to review the test results and decide on a treatment plan. She has Allergic rhinitis; Asthma; ALLERGY, FOOD, HX OF; Family history of malignant neoplasm of breast; GERD (gastroesophageal reflux disease); HTN (hypertension); Obstructive sleep apnea; Depression, recurrent; BMI 50.0-59.9, adult (HCC); Anxiety; Atherosclerosis of aorta; Advance directive discussed with patient; Senile purpura; Morbid obesity (HCC); Decreased ROM of intervertebral discs of lumbar spine; Impaired functional mobility, balance, gait, and endurance; Chronic lumbar spine pain; Osteoarthritis of knees (Bilateral); Morbid (severe) obesity due to excess calories (HCC); Chronic pain syndrome; Pharmacologic therapy; Disorder of skeletal system; Problems influencing health status; Chronic hip pain (Right); Chronic knee pain (Bilateral); 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); Chronic lower  extremity pain (Right); Lumbar facet hypertrophy; Lumbar facet joint pain; Lumbar facet joint syndrome; Elevated sed rate; Elevated C-reactive protein (CRP); Osteoarthritis involving multiple joints; Class 3 obesity with alveolar hypoventilation and body mass index (BMI) of 50.0 to 59.9 in adult [facility]); DDD (degenerative disc disease), lumbar; Lumbar facet arthropathy (Multilevel) (Bilateral); Tricompartment osteoarthritis of knees (Bilateral); Low back pain of over 3 months duration; Low back pain radiating to right leg; Multifactorial low back pain; Mechanical low back pain; DDD (degenerative disc disease), thoracic; Grade 1 Anterolisthesis of (L4/L5); and Osteopenia determined by x-ray on their problem list. Her primarily concern today is the Back Pain, Hip Pain, and Knee Pain  Pain Assessment: Location: Lower, Left Back Radiating: Right lower back into buttocks down outer right leg to right ankle Onset: More than a month ago Duration: Chronic pain Quality: Aching, Dull, Sharp, Tingling, Discomfort, Nagging (Lighting bolt pain) Severity: 4 /10 (subjective, self-reported pain score)  Effect on ADL: Limits ADL. Hard to walk due to pain Timing: Constant Modifying factors: Laying down and resting BP: 131/76  HR: 67  Mary Hardin comes in today for a follow-up visit after her initial evaluation on 06/02/2024. Today we went over the results of her tests. These were explained in Layman's terms. During today's appointment we went over my diagnostic impression, as well as the proposed treatment plan.  Review of initial evaluation (06/02/2024): Mary Hardin is a 68 year old female with lumbar spondylosis who presents with right leg pain.   She experiences burning pain in the lower back of her buttock, radiating down the lateral aspect of her right leg to the foot, with tingling sensations described as 'lightning bolts'. The pain began approximately six months ago after lifting something  heavy. Morning pain is rated as ten out of ten, sometimes causing weakness in the leg and  difficulty walking.   She takes Lyrica , initially at 75 mg three times a day, increased to 150 mg, and recently to 200 mg three times a day, totaling 600 mg daily. Despite this, she sometimes requires two extra strength Tylenol until the Lyrica  takes effect. She has been on the current dosage for about a week.   She has a history of lumbar spondylosis, as shown on prior imaging. X-rays in 2023 showed no recent fractures. Physical therapy was attempted but reportedly worsened her symptoms.   Review of diagnostic test ordered on 06/02/2024:  Diagnostic lab work: Available blood work demonstrates an elevated CRP of 27 mg/L (normal 0-10) with an elevated sed rate of 83 mm/h (normal 0-40).  The rest of the available blood work was within normal limits. Diagnostic imaging: Diagnostic x-rays of both hips show no acute abnormalities, fracture, pelvic bone diastases, or dislocation.  Diagnostic x-rays of the right knee show no acute fracture or dislocation.  Moderate tricompartmental osteoarthritis of the knee.  Moderate joint space loss and large osteophyte formation in the medial and patellofemoral compartments.  Mild joint space narrowing and osteophyte formation in the lateral compartment.  Osteopenia.  Diagnostic x-rays of the left knee show no acute fracture or dislocation.  Moderate to severe tricompartmental osteoarthritis of the knee.  No significant joint effusion.  Moderate to severe tricompartmental joint space loss with osteophyte formation, consistent with moderate to severe tricompartmental osteoarthritis of the knee.  Osteopenia.  Diagnostic x-rays of the lumbar spine with flexion-extension views show no acute fracture or malalignment of the lumbar spine.  Mild degenerative disc disease of the lumbar spine with facet arthropathy.  Multilevel facet joint arthropathy.  Mild intervertebral disc loss at L4-5 and  L5-S1 with osteophyte formation throughout the thoracolumbar spine.  Diagnostic MRI of the lumbar spine shows facet arthrosis throughout the entire lumbar spine, worse at the L3-4, L4-5, and L5-S1 levels.  Discussed the use of AI scribe software for clinical note transcription with the patient, who gave verbal consent to proceed.  History of Present Illness   Mary Hardin is a 68 year old female with morbid obesity and osteoarthritis who presents with right lower extremity pain and sciatica.  She experiences burning pain in the lower back and buttocks, radiating down the right leg into the foot, with neurogenic and neuropathic characteristics, including burning and electrical sensations. The pain is constant in the sciatic nerve area, causing soreness and discomfort when sitting or driving. She uses autopilot in her car to manage pain during driving. There is no leg weakness, but she walks slowly to avoid falls.  Current pain management includes Lyrica  600 mg per day. Diagnostic workup shows elevated CRP and sed rate. X-rays reveal tricompartmental osteoarthritis in both knees and mild degenerative disc disease with facet arthritis in the lumbar spine. MRI confirms facet joint arthrosis, particularly at L3-4, L4-5, and L5-S1 levels.  Her medical history includes morbid obesity with a BMI over 50, bronchial asthma, gastroesophageal reflux disease, hypertension, obstructive sleep apnea, a history of breast cancer, and anxiety and depression. She uses a CPAP machine for sleep apnea, which has improved her asthma symptoms. She is actively trying to lose weight to reduce her BMI to 30, which is challenging due to her pain and limited mobility.      Patient presented with interventional treatment options. Mary Hardin was informed that I will not be providing medication management. Pharmacotherapy evaluation including recommendations may be offered, if specifically requested.   Controlled  Substance Pharmacotherapy Assessment REMS (Risk Evaluation and Mitigation Strategy)  Opioid Analgesic: None MME/day: 0 mg/day   Pill Count: None expected due to no prior prescriptions written by our practice. Bonner Norris, RN  06/09/2024  8:56 AM  Sign when Signing Visit Safety precautions to be maintained throughout the outpatient stay will include: orient to surroundings, keep bed in low position, maintain call bell within reach at all times, provide assistance with transfer out of bed and ambulation.     Pharmacokinetics: Liberation and absorption (onset of action): WNL Distribution (time to peak effect): WNL Metabolism and excretion (duration of action): WNL         Pharmacodynamics: Desired effects: Analgesia: Mary Hardin reports >50% benefit. Functional ability: Patient reports that medication allows her to accomplish basic ADLs Clinically meaningful improvement in function (CMIF): Sustained CMIF goals met Perceived effectiveness: Described as relatively effective, allowing for increase in activities of daily living (ADL) Undesirable effects: Side-effects or Adverse reactions: None reported Monitoring: [place] PMP: PDMP reviewed during this encounter. Online review of the past 3-month period previously conducted. Not applicable at this point since we have not taken over the patient's medication management yet. List of other Serum/Urine Drug Screening Test(s):  No results found for: AMPHSCRSER, BARBSCRSER, BENZOSCRSER, COCAINSCRSER, COCAINSCRNUR, PCPSCRSER, THCSCRSER, THCU, CANNABQUANT, OPIATESCRSER, OXYSCRSER, PROPOXSCRSER, ETH, CBDTHCR, D8THCCBX, D9THCCBX List of all UDS test(s) done:  Lab Results  Component Value Date   SUMMARY FINAL 06/02/2024   Last UDS on record: Summary  Date Value Ref Range Status  06/02/2024 FINAL  Final    Comment:    ==================================================================== Compliance Drug Analysis,  Ur ==================================================================== Test                             Result       Flag       Units  Drug Present and Declared for Prescription Verification   Pregabalin                      PRESENT      EXPECTED   Citalopram                     PRESENT      EXPECTED   Desmethylcitalopram            PRESENT      EXPECTED    Desmethylcitalopram is an expected metabolite of citalopram or the    enantiomeric form, escitalopram .    Acetaminophen                  PRESENT      EXPECTED  Drug Absent but Declared for Prescription Verification   Lorazepam                       Not Detected UNEXPECTED ng/mg creat   Baclofen                        Not Detected UNEXPECTED ==================================================================== Test                      Result    Flag   Units      Ref Range   Creatinine              79               mg/dL      >=79 ==================================================================== Declared  Medications:  The flagging and interpretation on this report are based on the  following declared medications.  Unexpected results may arise from  inaccuracies in the declared medications.   **Note: The testing scope of this panel includes these medications:   Baclofen  (Lioresal )  Escitalopram  (Lexapro )  Lorazepam  (Ativan )  Pregabalin  (Lyrica )   **Note: The testing scope of this panel does not include small to  moderate amounts of these reported medications:   Acetaminophen (Tylenol)   **Note: The testing scope of this panel does not include the  following reported medications:   Docusate (Colace)  Epinephrine  (EpiPen )  Losartan  (Cozaar )  Meloxicam  (Mobic )  Valacyclovir (Valtrex)  Vitamin D3 ==================================================================== For clinical consultation, please call [phone]. ====================================================================    UDS interpretation: No  unexpected findings.          Medication Assessment Form: Not applicable. No opioids. Treatment compliance: Not applicable Risk Assessment Profile: Aberrant behavior: See initial evaluations. None observed or detected today Comorbid factors increasing risk of overdose: See initial evaluation. No additional risks detected today Opioid risk tool (ORT):     06/02/2024   11:12 AM  Opioid Risk   Alcohol 0  Illegal Drugs 0  Rx Drugs 0  Alcohol 0  Illegal Drugs 0  Rx Drugs 0  Age between 16-45 years  0  History of Preadolescent Sexual Abuse 0  Psychological Disease 0  Depression 1  Opioid Risk Tool Scoring 1  Opioid Risk Interpretation Low Risk    ORT Scoring interpretation table:  Score <3 = Low Risk for SUD  Score between 4-7 = Moderate Risk for SUD  Score >8 = High Risk for Opioid Abuse   Risk of substance use disorder (SUD): Low  Risk Mitigation Strategies:  Patient opioid safety counseling: No controlled substances prescribed. Patient-Prescriber Agreement (PPA): No agreement signed.  Controlled substance notification to other providers: None required. No opioid therapy.  Pharmacologic Plan: Non-opioid analgesic therapy offered. Interventional alternatives discussed.             Laboratory Chemistry Profile   Renal Lab Results  Component Value Date   BUN 16 04/17/2024   CREATININE 0.83 04/17/2024   BCR 19 04/17/2024   GFRAA 105 06/24/2020   GFRNONAA 91 06/24/2020   SPECGRAV 1.015 07/07/2022   PHUR 7.0 07/07/2022   PROTEINUR Negative 07/07/2022     Electrolytes Lab Results  Component Value Date   NA 141 04/17/2024   K 4.9 04/17/2024   CL 102 04/17/2024   CALCIUM 9.5 04/17/2024   MG 2.3 06/02/2024     Hepatic Lab Results  Component Value Date   AST 16 04/17/2024   ALT 13 04/17/2024   ALBUMIN 4.2 04/17/2024   ALKPHOS 100 04/17/2024     ID Lab Results  Component Value Date   HIV Non Reactive 10/27/2019     Bone Lab Results  Component Value Date    25OHVITD1 WILL FOLLOW 06/02/2024   25OHVITD2 WILL FOLLOW 06/02/2024   25OHVITD3 WILL FOLLOW 06/02/2024     Endocrine Lab Results  Component Value Date   GLUCOSE 87 04/17/2024   GLUCOSEU Negative 07/07/2022   HGBA1C 5.5 04/17/2024   TSH 2.500 04/17/2024   FREET4 0.90 04/17/2024     Neuropathy Lab Results  Component Value Date   VITAMINB12 645 06/02/2024   FOLATE 16.7 04/17/2024   HGBA1C 5.5 04/17/2024   HIV Non Reactive 10/27/2019     CNS No results found for: COLORCSF, APPEARCSF, RBCCOUNTCSF, WBCCSF, POLYSCSF, LYMPHSCSF, EOSCSF, PROTEINCSF, GLUCCSF, JCVIRUS,  CSFOLI, IGGCSF, LABACHR, ACETBL   Inflammation (CRP: Acute  ESR: Chronic) Lab Results  Component Value Date   CRP 27 (H) 06/02/2024   ESRSEDRATE 83 (H) 06/02/2024     Rheumatology No results found for: RF, ANA, LABURIC, URICUR, LYMEIGGIGMAB, LYMEABIGMQN, HLAB27   Coagulation Lab Results  Component Value Date   PLT 283 04/17/2024     Cardiovascular Lab Results  Component Value Date   HGB 12.1 04/17/2024   HCT 39.2 04/17/2024     Screening Lab Results  Component Value Date   HIV Non Reactive 10/27/2019     Cancer No results found for: CEA, CA125, LABCA2   Allergens No results found for: ALMOND, APPLE, ASPARAGUS, AVOCADO, BANANA, BARLEY, BASIL, BAYLEAF, GREENBEAN, LIMABEAN, WHITEBEAN, BEEFIGE, REDBEET, BLUEBERRY, BROCCOLI, CABBAGE, MELON, CARROT, CASEIN, CASHEWNUT, CAULIFLOWER, CELERY     Note: Lab results reviewed.  Recent Diagnostic Imaging Review  Thoracic Imaging: Thoracic DG w/swimmers view: Results for orders placed during the hospital encounter of 04/28/22 DG Thoracic Spine W/Swimmers  Narrative CLINICAL DATA:  Back pain  EXAM: THORACIC SPINE - 3 VIEWS  COMPARISON:  None Available.  FINDINGS: No recent fracture is seen. Alignment of posterior margins of vertebral bodies is within normal  limits. Degenerative changes are noted with bony spurs in mid and lower thoracic spine. Paraspinal soft tissues are unremarkable.  IMPRESSION: No recent fracture is seen in thoracic spine. Degenerative changes are noted with bony spurs in mid and lower thoracic spine.   Electronically Signed By: Gearldine Mary M.D. On: 04/30/2022 20:59  Lumbosacral Imaging: Lumbar MR wo contrast: Results for orders placed during the hospital encounter of 06/03/24 MR LUMBAR SPINE WO CONTRAST  Narrative MR LUMBAR SPINE WITHOUT IV CONTRAST  COMPARISON: X-ray 06/03/2004  CLINICAL HISTORY: Low back pain.  TECHNIQUE: SAG T2, SAG T1, SAG STIR, AX T2, AX T1 without IV contrast.  FINDINGS: There is normal alignment of the lumbar spine. Mild multilevel facet arthrosis is present. No significant Modic changes are identified. There is no vertebral body height loss, subluxation or marrow replacing process. The sacrum and SI joints are unremarkable so far as visualized. Conus and cauda equina are unremarkable.  T12-L1: There is no focal disc protrusion, foraminal or spinal stenosis. Mild facet arthrosis.  L1-2: There is no focal disc protrusion, foraminal or spinal stenosis. Mild facet arthrosis.  L2-3: There is no focal disc protrusion, foraminal or spinal stenosis. Mild facet arthrosis.  L3-4: There is no focal disc protrusion, foraminal or spinal stenosis. Mild-to-moderate facet arthrosis.  L4-5: There is no focal disc protrusion, foraminal or spinal stenosis. Moderate facet arthrosis.  L5-S1: Mild disc desiccation without focal protrusion, foraminal or spinal stenosis. Moderate facet arthrosis.  The retroperitoneal structures demonstrate no significant abnormality.  IMPRESSION: Disc desiccation and mild-to-moderate facet arthrosis at L4-5 and L5-S1. There is no significant foraminal or spinal stenosis. No acute abnormality is present.  Electronically signed by: Norleen Satchel MD  06/04/2024 01:00 PM EST RP Workstation: [id number]  Lumbar DG Bending views: Results for orders placed during the hospital encounter of 06/03/24 DG Lumbar Spine Complete W/Bend  Narrative EXAM: 6 VIEW(S) XRAY OF THE LUMBAR SPINE 06/03/2024 04:37:00 PM  COMPARISON: None available.  CLINICAL HISTORY: Low back pain.  FINDINGS:  LUMBAR SPINE: BONES: Hypoplastic ribs on T12. 5 non-rib-bearing lumbar-type vertebral bodies with expected lumbar lordosis. Mild grade 1 anterolisthesis of L4-L5. Diffuse osteopenia. No acute fracture. No aggressive appearing osseous lesion.  DISCS AND DEGENERATIVE CHANGES: Multilevel facet arthropathy. Mild intervertebral disc height loss at  L4-L5 and L5-S1 with osteophyte formation throughout the thoracolumbar spine.  SOFT TISSUES: Cholecystectomy clips. No pars interarticularis defects appreciated.  IMPRESSION: 1. No acute fracture or malalignment of the lumbar spine. 2. Mild degenerative disc disease of the lumbar spine with facet arthropathy.  Electronically signed by: Rogelia Myers MD 06/03/2024 04:51 PM EST RP Workstation: [id number]  Hip Imaging: Hip-B DG Bilateral (5V): Results for orders placed during the hospital encounter of 06/03/24 DG HIPS BILAT W OR W/O PELVIS MIN 5 VIEWS  Narrative EXAM: 5 OR MORE VIEW(S) XRAY OF THE BILATERAL HIP 06/03/2024 04:37:00 PM  COMPARISON: None available.  CLINICAL HISTORY: Chronic bilateral hip pain (M25.551, M25.552, G89.29)  FINDINGS:  BONES AND JOINTS: No acute fracture, pelvic bone diastasis, or dislocation. No focal osseous lesion. The hip joint is maintained. No significant degenerative changes.  SOFT TISSUES: The soft tissues are unremarkable.  IMPRESSION: 1. No acute fracture, pelvic bone diastasis, or dislocation.  Electronically signed by: Rogelia Myers MD 06/03/2024 04:59 PM EST RP Workstation: [id number]  Knee Imaging: Knee-R DG 4 views: Results for orders placed  during the hospital encounter of 06/03/24 DG Knee Complete 4 Views Right  Narrative EXAM: 4 VIEW(S) XRAY OF THE RIGHT KNEE 06/03/2024 04:37:00 PM  COMPARISON: None available.  CLINICAL HISTORY: Right knee pain/arthralgia  FINDINGS:  BONES AND JOINTS: No acute fracture or dislocation. No focal osseous lesion. Moderate joint space loss and large osteophyte formation in the medial and patellofemoral compartments. Mild joint space narrowing and osteophyte formation in the lateral compartment. Osteopenia. No significant joint effusion.  SOFT TISSUES: The soft tissues are unremarkable.  IMPRESSION: 1. No acute fracture or dislocation. 2. Moderate tricompartmental osteoarthritis of the knee.  Electronically signed by: Rogelia Myers MD 06/03/2024 04:49 PM EST RP Workstation: [id number]  Knee-L DG 4 views: Results for orders placed during the hospital encounter of 06/03/24 DG Knee Complete 4 Views Left  Narrative EXAM: 4 VIEW(S) XRAY OF THE LEFT KNEE 06/03/2024 04:37:00 PM  COMPARISON: None available.  CLINICAL HISTORY: Left knee pain/arthralgia  FINDINGS:  BONES AND JOINTS: No acute fracture or dislocation. No focal osseous lesion. No significant joint effusion. Moderate to severe tricompartmental joint space loss with osteophyte formation, consistent with moderate to severe tricompartmental osteoarthritis of the knee. Osteopenia.  SOFT TISSUES: The soft tissues are unremarkable.  IMPRESSION: 1. No acute fracture or dislocation. 2. Moderate to severe tricompartmental osteoarthritis of the knee.  Electronically signed by: Rogelia Myers MD 06/03/2024 05:01 PM EST RP Workstation: [id number]  Complexity Note: Imaging results reviewed.                         Meds   Current Outpatient Medications:    acetaminophen (TYLENOL) 500 MG tablet, Take 500 mg by mouth every 6 (six) hours as needed., Disp: , Rfl:    Cholecalciferol (VITAMIN D3) 5000 units CAPS, Take  5,000 Units by mouth daily., Disp: , Rfl:    docusate sodium (COLACE) 100 MG capsule, Take 100 mg by mouth daily., Disp: , Rfl:    EPINEPHrine  (EPIPEN  2-PAK) 0.3 mg/0.3 mL IJ SOAJ injection, Inject 0.3 mg into the muscle as needed for anaphylaxis., Disp: 1 each, Rfl: 12   escitalopram  (LEXAPRO ) 20 MG tablet, Take 1 tablet (20 mg total) by mouth daily., Disp: 90 tablet, Rfl: 1   LORazepam  (ATIVAN ) 1 MG tablet, Take 1 tablet (1 mg total) by mouth daily as needed for anxiety., Disp: 30 tablet, Rfl: 0   losartan  (COZAAR ) 25 MG  tablet, Take 1 tablet (25 mg total) by mouth daily., Disp: 90 tablet, Rfl: 1   meloxicam  (MOBIC ) 15 MG tablet, Take 1 tablet (15 mg total) by mouth daily., Disp: 90 tablet, Rfl: 1   pregabalin  (LYRICA ) 200 MG capsule, Take 1 capsule (200 mg total) by mouth 3 (three) times daily., Disp: 90 capsule, Rfl: 0   [START ON 06/20/2024] pregabalin  (LYRICA ) 200 MG capsule, Take 1 capsule (200 mg total) by mouth 2 (two) times daily., Disp: 90 capsule, Rfl: 1  ROS  Constitutional: Denies any fever or chills Gastrointestinal: No reported hemesis, hematochezia, vomiting, or acute GI distress Musculoskeletal: Denies any acute onset joint swelling, redness, loss of ROM, or weakness Neurological: No reported episodes of acute onset apraxia, aphasia, dysarthria, agnosia, amnesia, paralysis, loss of coordination, or loss of consciousness  Allergies  Mary Hardin is allergic to shellfish allergy and shellfish protein-containing drug products.  PFSH  Drug: Mary Hardin  reports no history of drug use. Alcohol:  reports current alcohol use. Tobacco:  reports that she has never smoked. She has never used smokeless tobacco. Medical:  has a past medical history of Anemia, Anxiety, Arthritis, Asthma, Depression, Family history of adverse reaction to anesthesia, Family history of malignant neoplasm of breast, Fatty liver, GERD (gastroesophageal reflux disease), Headache, Hernia of abdominal wall,  Hypertension, Joint pain, Multiple food allergies, PONV (postoperative nausea and vomiting), and Sleep apnea. Surgical: Mary Hardin  has a past surgical history that includes Cholecystectomy; Dilation and curettage of uterus; Varicose vein surgery; Eye surgery; Colonoscopy w/ biopsies and polypectomy; wisdom teeth extraction; Colonoscopy (N/A, 08/31/2017); and Esophagogastroduodenoscopy (N/A, 08/31/2017). Family: family history includes Arrhythmia in her mother; Breast cancer in her paternal aunt; Breast cancer (age of onset: 68) in her maternal aunt; Breast cancer (age of onset: 51) in her maternal grandmother; Breast cancer (age of onset: 13) in her mother; Cancer in her brother; Diabetes in her maternal grandmother; Heart disease in her mother; Hyperlipidemia in her father; Hypertension in her father and mother; Pancreatic cancer (age of onset: 10) in her paternal grandmother; Seizures in her father; Stroke in her father; Thyroid cancer in her cousin; Thyroid disease in her mother.  Constitutional Exam  General appearance: Well nourished, well developed, and well hydrated. In no apparent acute distress Vitals:   06/09/24 0855  BP: 131/76  Pulse: 67  Resp: 16  Temp: (!) 97.3 F (36.3 C)  TempSrc: Temporal  SpO2: 98%  Weight: (!) 350 lb (158.8 kg)  Height: 5' 8 (1.727 m)   BMI Assessment: Estimated body mass index is 53.22 kg/m as calculated from the following:   Height as of this encounter: 5' 8 (1.727 m).   Weight as of this encounter: 350 lb (158.8 kg).  BMI interpretation table: BMI level Category Range association with higher incidence of chronic pain  <18 kg/m2 Underweight   18.5-24.9 kg/m2 Ideal body weight   25-29.9 kg/m2 Overweight Increased incidence by 20%  30-34.9 kg/m2 Obese (Class I) Increased incidence by 68%  35-39.9 kg/m2 Severe obesity (Class II) Increased incidence by 136%  >40 kg/m2 Extreme obesity (Class III) Increased incidence by 254%   Patient's current BMI  Ideal Body weight  Body mass index is 53.22 kg/m. Ideal body weight: 63.9 kg (140 lb 14 oz) Adjusted ideal body weight: 101.8 kg (224 lb 8.4 oz)   BMI Readings from Last 4 Encounters:  06/09/24 53.22 kg/m  06/02/24 53.22 kg/m  05/23/24 52.15 kg/m  04/17/24 52.15 kg/m   Wt Readings from Last  4 Encounters:  06/09/24 (!) 350 lb (158.8 kg)  06/02/24 (!) 350 lb (158.8 kg)  05/23/24 (!) 343 lb (155.6 kg)  04/17/24 (!) 343 lb (155.6 kg)    Psych/Mental status: Alert, oriented x 3 (person, place, & time)       Eyes: PERLA Respiratory: No evidence of acute respiratory distress  Assessment & Plan  Primary Diagnosis & Pertinent Problem List: The primary encounter diagnosis was 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 Diagnoses of Chronic lower extremity pain (Right), Grade 1 Anterolisthesis of L4/L5, Lumbar facet arthropathy (Multilevel) (Bilateral), Low back pain of over 3 months duration, Low back pain radiating to right leg, Multifactorial low back pain, Mechanical low back pain, Degeneration of intervertebral disc of lumbar region, unspecified whether pain present, DDD (degenerative disc disease), thoracic, Chronic hip pain (Right), Chronic knee pain (Bilateral), Osteoarthritis of knees (Bilateral), Tricompartment osteoarthritis of knees (Bilateral), Osteoarthritis involving multiple joints, Osteopenia determined by x-ray, Elevated sed rate, Elevated C-reactive protein (CRP), Impaired functional mobility, balance, gait, and endurance, and Class 3 obesity with alveolar hypoventilation, serious comorbidity, and body mass index (BMI) of 50.0 to 59.9 in adult [facility]) were also pertinent to this visit. Visit Diagnosis: 1. 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 Chronic lower extremity pain (Right)   3. Grade 1 Anterolisthesis of L4/L5   4. Lumbar facet arthropathy (Multilevel) (Bilateral)   5. Low back pain of over 3 months duration   6. Low back pain radiating  to right leg   7. Multifactorial low back pain   8. Mechanical low back pain   9. Degeneration of intervertebral disc of lumbar region, unspecified whether pain present   10. DDD (degenerative disc disease), thoracic   11. Chronic hip pain (Right)   12. Chronic knee pain (Bilateral)   13. Osteoarthritis of knees (Bilateral)   14. Tricompartment osteoarthritis of knees (Bilateral)   15. Osteoarthritis involving multiple joints   16. Osteopenia determined by x-ray   17. Elevated sed rate   18. Elevated C-reactive protein (CRP)   19. Impaired functional mobility, balance, gait, and endurance   20. Class 3 obesity with alveolar hypoventilation, serious comorbidity, and body mass index (BMI) of 50.0 to 59.9 in adult [facility])    Problems updated and reviewed during this visit: Problem  Osteoarthritis involving multiple joints  Ddd (Degenerative Disc Disease), Lumbar  Lumbar facet arthropathy (Multilevel) (Bilateral)  Tricompartment osteoarthritis of knees (Bilateral)  Low Back Pain of Over 3 Months Duration  Low Back Pain Radiating to Right Leg  Multifactorial Low Back Pain  Mechanical Low Back Pain  Ddd (Degenerative Disc Disease), Thoracic  Grade 1 Anterolisthesis of (L4/L5)  Osteopenia Determined By X-Ray   (06/03/2024) bilateral knee x-rays   Chronic hip pain (Right)  Chronic knee pain (Bilateral)  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)  Chronic lower extremity pain (Right)  Lumbar facet hypertrophy  Decreased Rom of Intervertebral Discs of Lumbar Spine  Impaired Functional Mobility, Balance, Gait, and Endurance  Osteoarthritis of knees (Bilateral)  Chronic lumbar spine pain  Elevated Sed Rate  Elevated C-Reactive Protein (Crp)  Class 3 Obesity With Alveolar Hypoventilation and Body Mass Index (Bmi) of 50.0 to 59.9 in Adult ([facility])  Obstructive Sleep Apnea    Plan of Care  Assessment and Plan    Chronic low back pain with lumbar facet arthropathy and disc  degeneration   Chronic low back pain is due to lumbar facet arthropathy and disc degeneration, with  facet hypertrophy and arthritis contributing to the pain. The pain is multifactorial, with some components originating from outside the spine. An epidural steroid injection with sedation is scheduled for leg pain management. She received written instructions for epidural preparation and has arranged for a driver on the procedure day.  Right lower extremity radicular pain (sciatica)   Right lower extremity radicular pain is consistent with sciatica, radiating from the lower back to the foot with burning and electrical sensations. The pain is neurogenic/neuropathic, with no weakness reported, and is multifactorial, with some components originating from inside the spine. An epidural steroid injection with sedation is scheduled for leg pain management. She received written instructions for epidural preparation and has arranged for a driver on the procedure day.  Bilateral tricompartmental knee osteoarthritis   Bilateral tricompartmental knee osteoarthritis affects all three compartments of both knees. This wear and tear condition may require knee replacements. Weight loss is crucial to slow progression and improve outcomes. She is encouraged to reduce her BMI to 30 to slow osteoarthritis progression, and the potential for knee replacements was discussed if the condition progresses.  Morbid obesity (BMI > 50) with reduced mobility   Morbid obesity with a BMI over 50 contributes to reduced mobility and exacerbates osteoarthritis and other conditions. Weight loss is essential to improve overall health and manage comorbidities. She is encouraged to pursue weight loss through diet and potential therapies with her primary care physician. The potential for appetite-suppressing injections was also discussed with her primary care physician.  Elevated inflammatory markers with suspected autoimmune disease   Elevated  CRP and sed rate indicate chronic inflammation, with a differential diagnosis including autoimmune diseases such as rheumatoid arthritis or lupus. Further testing is required to confirm the diagnosis. Additional blood work has been ordered to check ANA and rheumatoid factor. A referral to a rheumatologist will be considered if an autoimmune disease is confirmed.        Pharmacotherapy (Medications Ordered): No orders of the defined types were placed in this encounter.  Procedure Orders         Lumbar Epidural Injection     Orders Placed This Encounter  Procedures   Lumbar Epidural Injection    Standing Status:   Future    Expiration Date:   09/09/2024    Scheduling Instructions:     Procedure: Interlaminar Lumbar Epidural Steroid injection (LESI)  L4-5     Laterality: Right-sided     Sedation: With Sedation.     Timeframe: ASAP    Where will this procedure be performed?:   [facility]   Rheumatoid factor    Cc PCP: Vicci Duwaine SQUIBB, DO    CC Results:   PCP-NURSE [[id number]]    Release to patient:   Immediate   ANA w/Reflex if Positive    Cc PCP: Vicci Duwaine SQUIBB, DO    CC Results:   PCP-NURSE [298728]    Release to patient:   Immediate   Uric acid    Cc PCP: Vicci Duwaine SQUIBB, DO    CC Results:   PCP-NURSE [[id number]]    Release to patient:   Immediate   Lab Orders         Rheumatoid factor         ANA w/Reflex if Positive         Uric acid     Imaging Orders  No imaging studies ordered today   Referral Orders  No referral(s) requested today    Pharmacological management:  Opioid Analgesics: I will not be prescribing any opioids at this time Membrane stabilizer: I will not be prescribing any at this time Muscle relaxant: I will not be prescribing any at this time NSAID: I will not be prescribing any at this time Other analgesic(s): I will not be prescribing any at this time      Interventional Therapies  Risk Factors  Considerations  Medical Comorbidities:   MO (BMI>50) (350 lbs)  BA  GERD  HTN  OSA  Anxiety/Depression  Hx. Breast Cancer     Planned  Pending:      Under consideration:   Pending   Completed: (Analgesic benefit)1  None at this time   Therapeutic  Palliative (PRN) options:   None established   Completed by other providers:   Diagnostic/therapeutic bilateral IA steroid/gel knee injections by [facility] every 3 months   1(Analgesic benefit): Expressed in percentage (%). (Local anesthetic[LA] +/- sedation  L.A.Local Anesthetic  Steroid benefit  Ongoing benefit)      Provider-requested follow-up: Return for (ECT): (R) L4-5 LESI #1. Recent Visits Date Type Provider Dept  06/02/24 Office Visit Tanya Glisson, MD [facility]  Showing recent visits within past 90 days and meeting all other requirements Today's Visits Date Type Provider Dept  06/09/24 Office Visit Tanya Glisson, MD [facility]  Showing today's visits and meeting all other requirements Future Appointments Date Type Provider Dept  06/12/24 Appointment Tanya Glisson, MD [facility]  Showing future appointments within next 90 days and meeting all other requirements   Primary Care Physician: Vicci Duwaine SQUIBB, DO  Duration of encounter: 106 minutes.  Total time on encounter, as per AMA guidelines included both the face-to-face and non-face-to-face time personally spent by the physician and/or other qualified health care professional(s) on the day of the encounter (includes time in activities that require the physician or other qualified health care professional and does not include time in activities normally performed by clinical staff). Physician's time may include the following activities when performed: Preparing to see the patient (e.g., pre-charting review of records, searching for previously ordered imaging, lab work, and nerve conduction tests) Review of prior analgesic pharmacotherapies. Reviewing  PMP Interpreting ordered tests (e.g., lab work, imaging, nerve conduction tests) Performing post-procedure evaluations, including interpretation of diagnostic procedures Obtaining and/or reviewing separately obtained history Performing a medically appropriate examination and/or evaluation Counseling and educating the patient/family/caregiver Ordering medications, tests, or procedures Referring and communicating with other health care professionals (when not separately reported) Documenting clinical information in the electronic or other health record Independently interpreting results (not separately reported) and communicating results to the patient/ family/caregiver Care coordination (not separately reported)  Note by: Glisson DELENA Tanya, MD (TTS technology used. I apologize for any typographical errors that were not detected and corrected.) Date: 06/09/2024; Time: 12:52 PM

## 2024-06-09 NOTE — Progress Notes (Signed)
 Safety precautions to be maintained throughout the outpatient stay will include: orient to surroundings, keep bed in low position, maintain call bell within reach at all times, provide assistance with transfer out of bed and ambulation.

## 2024-06-09 NOTE — Patient Instructions (Addendum)
 ______________________________________________________________________    Procedure instructions  Stop blood-thinners  Do not eat or drink fluids (other than water) for 6 hours before your procedure  No water for 2 hours before your procedure  Take your blood pressure medicine with a sip of water  Arrive 30 minutes before your appointment  If sedation is planned, bring suitable driver. Mary Hardin, Mary Hardin, & public transportation are NOT APPROVED)  Carefully read the Preparing for your procedure detailed instructions  If you have questions call us  at [phone]  Procedure appointments are for procedures only.   NO medication refills or new problem evaluations will be done on procedure days.   Only the scheduled, pre-approved procedure and side will be done.   ______________________________________________________________________     ______________________________________________________________________    Preparing for your procedure  Appointments: If you think you may not be able to keep your appointment, call 24-48 hours in advance to cancel. We need time to make it available to others.  Procedure visits are for procedures only. During your procedure appointment there will be: NO Prescription Refills*. NO medication changes or discussions*. NO discussion of disability issues*. NO unrelated pain problem evaluations*. NO evaluations to order other pain procedures*. *These will be addressed at a separate and distinct evaluation encounter on the provider's evaluation schedule and not during procedure days.  Instructions: Food intake: Avoid eating anything solid for at least 8 hours prior to your procedure. Clear liquid intake: You may take clear liquids such as water up to 2 hours prior to your procedure. (No carbonated drinks. No soda.) Transportation: Unless otherwise stated by your physician, bring a driver. (Driver cannot be a Market Researcher, Pharmacist, Community, or any other form of public  transportation.) Morning Medicines: Except for blood thinners, take all of your other morning medications with a sip of water. Make sure to take your heart and blood pressure medicines. If your blood pressure's lower number is above 100, the case will be rescheduled. Blood thinners: Make sure to stop your blood thinners as instructed.  If you take a blood thinner, but were not instructed to stop it, call our office [phone] and ask to talk to a nurse. Not stopping a blood thinner prior to certain procedures could lead to serious complications. Diabetics on insulin : Notify the staff so that you can be scheduled 1st case in the morning. If your diabetes requires high dose insulin , take only  of your normal insulin  dose the morning of the procedure and notify the staff that you have done so. Preventing infections: Shower with an antibacterial soap the morning of your procedure.  Build-up your immune system: Take 1000 mg of Vitamin C with every meal (3 times a day) the day prior to your procedure. Antibiotics: Inform the nursing staff if you are taking any antibiotics or if you have any conditions that may require antibiotics prior to procedures. (Example: recent joint implants)   Pregnancy: If you are pregnant make sure to notify the nursing staff. Not doing so may result in injury to the fetus, including death.  Sickness: If you have a cold, fever, or any active infections, call and cancel or reschedule your procedure. Receiving steroids while having an infection may result in complications. Arrival: You must be in the facility at least 30 minutes prior to your scheduled procedure. Tardiness: Your scheduled time is also the cutoff time. If you do not arrive at least 15 minutes prior to your procedure, you will be rescheduled.  Children: Do not bring any children with  you. Make arrangements to keep them home. Dress appropriately: There is always a possibility that your clothing may get soiled. Avoid  long dresses. Valuables: Do not bring any jewelry or valuables.  Reasons to call and reschedule or cancel your procedure: (Following these recommendations will minimize the risk of a serious complication.) Surgeries: Avoid having procedures within 2 weeks of any surgery. (Avoid for 2 weeks before or after any surgery). Flu Shots: Avoid having procedures within 2 weeks of a flu shots or . (Avoid for 2 weeks before or after immunizations). Barium: Avoid having a procedure within 7-10 days after having had a radiological study involving the use of radiological contrast. (Myelograms, Barium swallow or enema study). Heart attacks: Avoid any elective procedures or surgeries for the initial 6 months after a Myocardial Infarction (Heart Attack). Blood thinners: It is imperative that you stop these medications before procedures. Let us  know if you if you take any blood thinner.  Infection: Avoid procedures during or within two weeks of an infection (including chest colds or gastrointestinal problems). Symptoms associated with infections include: Localized redness, fever, chills, night sweats or profuse sweating, burning sensation when voiding, cough, congestion, stuffiness, runny nose, sore throat, diarrhea, nausea, vomiting, cold or Flu symptoms, recent or current infections. It is specially important if the infection is over the area that we intend to treat. Heart and lung problems: Symptoms that may suggest an active cardiopulmonary problem include: cough, chest pain, breathing difficulties or shortness of breath, dizziness, ankle swelling, uncontrolled high or unusually low blood pressure, and/or palpitations. If you are experiencing any of these symptoms, cancel your procedure and contact your primary care physician for an evaluation.  Remember:  Regular Business hours are:  Monday to Thursday 8:00 AM to 4:00 PM  Provider's Schedule: Eric Como, MD:  Procedure days: Tuesday and Thursday 7:30  AM to 4:00 PM  Wallie Sherry, MD:  Procedure days: Monday and Wednesday 7:30 AM to 4:00 PM Last  Updated: 07/03/2023 ______________________________________________________________________     ______________________________________________________________________    General Risks and Possible Complications  Patient Responsibilities: It is important that you read this as it is part of your informed consent. It is our duty to inform you of the risks and possible complications associated with treatments offered to you. It is your responsibility as a patient to read this and to ask questions about anything that is not clear or that you believe was not covered in this document.  Patient's Rights: You have the right to refuse treatment. You also have the right to change your mind, even after initially having agreed to have the treatment done. However, under this last option, if you wait until the last second to change your mind, you may be charged for the materials used up to that point.  Introduction: Medicine is not an visual merchandiser. Everything in Medicine, including the lack of treatment(s), carries the potential for danger, harm, or loss (which is by definition: Risk). In Medicine, a complication is a secondary problem, condition, or disease that can aggravate an already existing one. All treatments carry the risk of possible complications. The fact that a side effects or complications occurs, does not imply that the treatment was conducted incorrectly. It must be clearly understood that these can happen even when everything is done following the highest safety standards.  No treatment: You can choose not to proceed with the proposed treatment alternative. The "PRO(s)" would include: avoiding the risk of complications associated with the therapy. The "CON(s)" would include:  not getting any of the treatment benefits. These benefits fall under one of three categories: diagnostic; therapeutic; and/or  palliative. Diagnostic benefits include: getting information which can ultimately lead to improvement of the disease or symptom(s). Therapeutic benefits are those associated with the successful treatment of the disease. Finally, palliative benefits are those related to the decrease of the primary symptoms, without necessarily curing the condition (example: decreasing the pain from a flare-up of a chronic condition, such as incurable terminal cancer).  General Risks and Complications: These are associated to most interventional treatments. They can occur alone, or in combination. They fall under one of the following six (6) categories: no benefit or worsening of symptoms; bleeding; infection; nerve damage; allergic reactions; and/or death. No benefits or worsening of symptoms: In Medicine there are no guarantees, only probabilities. No healthcare provider can ever guarantee that a medical treatment will work, they can only state the probability that it may. Furthermore, there is always the possibility that the condition may worsen, either directly, or indirectly, as a consequence of the treatment. Bleeding: This is more common if the patient is taking a blood thinner, either prescription or over the counter (example: Goody Powders, Fish oil, Aspirin, Garlic, etc.), or if suffering a condition associated with impaired coagulation (example: Hemophilia, cirrhosis of the liver, low platelet counts, etc.). However, even if you do not have one on these, it can still happen. If you have any of these conditions, or take one of these drugs, make sure to notify your treating physician. Infection: This is more common in patients with a compromised immune system, either due to disease (example: diabetes, cancer, human immunodeficiency virus [HIV], etc.), or due to medications or treatments (example: therapies used to treat cancer and rheumatological diseases). However, even if you do not have one on these, it can still  happen. If you have any of these conditions, or take one of these drugs, make sure to notify your treating physician. Nerve Damage: This is more common when the treatment is an invasive one, but it can also happen with the use of medications, such as those used in the treatment of cancer. The damage can occur to small secondary nerves, or to large primary ones, such as those in the spinal cord and brain. This damage may be temporary or permanent and it may lead to impairments that can range from temporary numbness to permanent paralysis and/or brain death. Allergic Reactions: Any time a substance or material comes in contact with our body, there is the possibility of an allergic reaction. These can range from a mild skin rash (contact dermatitis) to a severe systemic reaction (anaphylactic reaction), which can result in death. Death: In general, any medical intervention can result in death, most of the time due to an unforeseen complication. ______________________________________________________________________      ______________________________________________________________________    Steroid injections  Common steroids for injections Triamcinolone: Used by many sports medicine physicians for large joint and bursal injections, often combined with a local anesthetic like lidocaine . A study focusing on coccydynia (tailbone pain) found triamcinolone was more effective than betamethasone, suggesting it may also be preferable for other localized inflammation conditions. Methylprednisolone: A common alternative to triamcinolone that is also a strong anti-inflammatory. It is available in different formulations, with the acetate suspension being the long-acting option for intra-articular injections. Dexamethasone: This is a non-particulate steroid, meaning it has a lower risk of tissue damage compared to particulate steroids like triamcinolone and methylprednisolone. While less common for this specific  use,  it is an option for targeted injections.   Considerations for physicians Particulate vs. non-particulate steroids: Triamcinolone and methylprednisolone are particulate, meaning they can clump together. Dexamethasone is non-particulate. Particulate steroids are often preferred for their longer-lasting effects but carry a theoretical higher risk for certain injections (though this is less of a concern in the costochondral joints). Combined injectate: Corticosteroids are typically mixed with a local anesthetic like lidocaine  to provide both immediate pain relief (from the anesthetic) and longer-term inflammation reduction (from the steroid). Imaging guidance: To ensure accurate placement of the needle and medication, physicians may use ultrasound or fluoroscopic guidance for the injection, especially in complex or refractory cases.   Patient guidance Before undergoing a steroid injection, discuss the options with your physician. They will determine the best steroid, dosage, and procedure for your specific case based on factors like: Severity of your condition History of response to other treatments Your overall health status Experience and preference of the physician  Last  Updated: 03/18/2024 ______________________________________________________________________     ______________________________________________________________________    Patient information on: Body mass index (BMI) and Weight Management  Dear Mary Hardin you are receiving this information because your weight may be adversely affecting your health.   Your current Estimated body mass index is 53.22 kg/m as calculated from the following:   Height as of 06/02/24: 5' 8 (1.727 m).   Weight as of 06/02/24: 350 lb (158.8 kg).  We recommend you talk to your primary care physician about providing or referring you to a supervised weight management program.  Here is some information about weight and the body mass index (BMI)  classification:  BMI is a measure of obesity that's calculated by dividing a person's weight in kilograms by their height in meters squared. A person can use an online calculator to determine their BMI. Body mass index (BMI) is a common tool for deciding whether a person has an appropriate body weight.  It measures a person's weight in relation to their height.  According to the [facility] of health (NIH): A BMI of less than 18.5 means that a person is underweight. A BMI of between 18.5 and 24.9 is ideal. A BMI of between 25 and 29.9 is overweight. A BMI over 30 indicates obesity.  Body Mass Index (BMI) Classification BMI level (kg/m2) Category Associated incidence of chronic pain  <18  Underweight   18.5-24.9 Ideal body weight   25-29.9 Overweight  20%  30-34.9 Obese (Class I)  68%  35-39.9 Severe obesity (Class II)  136%  >40 Extreme obesity (Class III)  254%    Morbidly Obese Classification: You will be considered to be Morbidly Obese if your BMI is above 30 and you have one or more of the following conditions caused or associated to obesity: 1.    Type 2 Diabetes (Leading to cardiovascular diseases (CVD), stroke, peripheral vascular diseases (PVD), retinopathy, nephropathy, and neuropathy) 2.    Cardiovascular Disease (High Blood Pressure; Congestive Heart Failure; High Cholesterol; Coronary Artery Disease; Angina; Arrhythmias, Dysrhythmias, or Heart Attacks) 3.    Breathing problems (Asthma; obesity-hypoventilation syndrome; obstructive sleep apnea; chronic inflammatory airway disease; reactive airway disease; or shortness of breath) 4.    Chronic kidney disease 5.    Liver disease (nonalcoholic fatty liver disease) 6.    High blood pressure 7.    Acid reflux (gastroesophageal reflux disease; heartburn) 8.    Osteoarthritis (OA) (affecting the hip(s), the knee(s) and/or the lower back) (usually requiring knee and/or hip replacements, as well as  back surgeries) 9.    Low  back pain (Lumbar Facet Syndrome; and/or Degenerative Disc Disease) 10.  Hip pain (Osteoarthritis of hip) (For every 1 lbs of added body weight, there is a 2 lbs increase in pressure inside of each hip articulation. 1:2 mechanical relationship) 11.  Knee pain (Osteoarthritis of knee) (For every 1 lbs of added body weight, there is a 4 lbs increase in pressure inside of each knee articulation. 1:4 mechanical relationship) (patients with a BMI>30 kg/m2 were 6.8 times more likely to develop knee OA than normal-weight individuals) 12.  Cancer: Epidemiological studies have shown that obesity is a risk factor for: post-menopausal breast cancer; cancers of the endometrium, colon and kidney cancer; malignant adenomas of the esophagus. Obese subjects have an approximately 1.5-3.5-fold increased risk of developing these cancers compared with normal-weight subjects, and it has been estimated that between 15 and 45% of these cancers can be attributed to overweight. More recent studies suggest that obesity may also increase the risk of other types of cancer, including pancreatic, hepatic and gallbladder cancer. (Ref: Obesity and cancer. Pischon T, Nthlings U, [employer] H. Proc Nutr Soc. 2008 May;67(2):128-45. doi: 10.1017/S0029665108006976.) The International Agency for Research on Cancer (IARC) has identified 13 cancers associated with overweight and obesity: meningioma, multiple myeloma, adenocarcinoma of the esophagus, and cancers of the thyroid, postmenopausal breast cancer, gallbladder, stomach, liver, pancreas, kidney, ovaries, uterus, colon and rectal (colorectal) cancers. 55 percent of all cancers diagnosed in women and 24 percent of those diagnosed in men are associated with overweight and obesity.  Recommendation: If you have any of the above conditions it is urgent that you take a step back and concentrate in losing weight. Dedicate 100% of your efforts on this task. Nothing else will improve your health more than  bringing your weight down and your BMI to less than 30.   Nutritionist and/or supervised weight-management program: We are aware that most chronic pain patients are unable to exercise secondary to their pain. For this reason, you must rely on proper nutrition and diet in order to lose the weight. We recommend you talk to a nutritionist.   Bariatric surgery: A person might be considered a candidate for bariatric surgery if they meet one of the following BMI criteria:  BMI of 40 or higher: This is considered extreme obesity (Class III). BMI of 35-39.9: This is considered obesity, and the person might also have a serious weight-related health condition, such as high blood pressure, type 2 diabetes, or severe sleep apnea  BMI of 30-34.9: This might be considered if the person has serious weight-related health problems and hasn't had substantial weight loss or improvement in co-morbidities through other methods   On your own: A realistic goal is to lose 10% of your body weight over a period of 12 months.  If over a period of six (6) months you have unsuccessfully tried to lose weight, then it is time for you to seek professional help and to enter a medically supervised weight management program, and/or undergo bariatric surgery.   Pain management considerations and possible limitations:  1.    Pharmacological Problems: Be advised that the use of opioid analgesics (oxycodone; hydrocodone; morphine; methadone; codeine; and all of their derivatives) have been associated with decreased metabolism and weight gain.  For this reason, should we see that you are unable to lose weight while taking these medications, it may become necessary for us  to taper down and indefinitely discontinue them.  2.    Technical Problems:  The incidence of successful interventional therapies decreases as the patient's BMI increases. It is much more difficult to accomplish a safe and effective interventional therapy on a patient with a  BMI above 35. 3.    Radiation Exposure Problems: The x-rays machine, used to accomplish injection therapies, will automatically increase their x-ray output in order to capture an appropriate bone image. This means that radiation exposure increases exponentially with the patient's BMI. (The higher the BMI, the higher the radiation exposure.) Although the level of radiation used at a given time is still safe to the patient, it is not for the physician and/or assisting staff. Unfortunately, radiation exposure is accumulative. Because physicians and the staff have to do procedures and be exposed on a daily basis, this can result in health problems such as cancer and radiation burns. Radiation exposure to the staff is monitored by the radiation batches that they wear. The exposure levels are reported back to the staff on a quarterly basis. Depending on levels of exposure, physicians and staff may be obligated by law to decrease this exposure. This means that they have the right and obligation to refuse providing therapies where they may be overexposed to radiation. For this reason, physicians may decline to offer therapies such as radiofrequency ablation or implants to patients with a BMI above 40. 4.    Current Trends: Be advised that the current trend is to no longer offer certain therapies to patients with a BMI equal to, or above 35, due to increase perioperative risks, increased technical procedural difficulties, and excessive radiation exposure to healthcare personnel.  Last updated: 04/16/2023 ______________________________________________________________________

## 2024-06-10 ENCOUNTER — Encounter: Payer: Self-pay | Admitting: Family Medicine

## 2024-06-10 LAB — RHEUMATOID FACTOR: Rheumatoid fact SerPl-aCnc: 10.4 [IU]/mL (ref <14.0)

## 2024-06-10 LAB — URIC ACID: Uric Acid: 4.7 mg/dL (ref 3.0–7.2)

## 2024-06-10 LAB — ANA W/REFLEX IF POSITIVE: Anti Nuclear Antibody (ANA): NEGATIVE

## 2024-06-10 NOTE — Telephone Encounter (Signed)
 Needs appointment

## 2024-06-10 NOTE — Telephone Encounter (Signed)
 Called patient to schedule appt. Patient stated she lives in [place] and did not want to come into office for visit. Stated she will discuss at her 08-01-24 appt with provider.

## 2024-06-11 LAB — 25-HYDROXY VITAMIN D LCMS D2+D3
25-Hydroxy, Vitamin D-2: 1 ng/mL
25-Hydroxy, Vitamin D-3: 84 ng/mL
25-Hydroxy, Vitamin D: 85 ng/mL

## 2024-06-11 LAB — C-REACTIVE PROTEIN: CRP: 27 mg/L — ABNORMAL HIGH (ref 0–10)

## 2024-06-11 LAB — SEDIMENTATION RATE: Sed Rate: 83 mm/h — ABNORMAL HIGH (ref 0–40)

## 2024-06-11 LAB — MAGNESIUM: Magnesium: 2.3 mg/dL (ref 1.6–2.3)

## 2024-06-11 LAB — VITAMIN B12: Vitamin B-12: 645 pg/mL (ref 232–1245)

## 2024-06-12 ENCOUNTER — Ambulatory Visit (HOSPITAL_BASED_OUTPATIENT_CLINIC_OR_DEPARTMENT_OTHER): Admitting: Pain Medicine

## 2024-06-12 ENCOUNTER — Encounter: Payer: Self-pay | Admitting: Pain Medicine

## 2024-06-12 ENCOUNTER — Ambulatory Visit
Admission: RE | Admit: 2024-06-12 | Discharge: 2024-06-12 | Disposition: A | Discharge status: Home / Self Care | Source: Ambulatory Visit | Attending: Pain Medicine | Admitting: Pain Medicine

## 2024-06-12 VITALS — BP 122/85 | HR 73 | Temp 97.4°F | Resp 16 | Ht 68.0 in | Wt 350.0 lb

## 2024-06-12 DIAGNOSIS — M79604 Pain in right leg: Secondary | ICD-10-CM

## 2024-06-12 DIAGNOSIS — M51361 Other intervertebral disc degeneration, lumbar region with lower extremity pain only: Secondary | ICD-10-CM

## 2024-06-12 DIAGNOSIS — M5441 Lumbago with sciatica, right side: Secondary | ICD-10-CM | POA: Insufficient documentation

## 2024-06-12 DIAGNOSIS — Z91013 Allergy to seafood: Secondary | ICD-10-CM | POA: Diagnosis present

## 2024-06-12 DIAGNOSIS — M4316 Spondylolisthesis, lumbar region: Secondary | ICD-10-CM

## 2024-06-12 DIAGNOSIS — R937 Abnormal findings on diagnostic imaging of other parts of musculoskeletal system: Secondary | ICD-10-CM | POA: Diagnosis present

## 2024-06-12 DIAGNOSIS — M545 Low back pain, unspecified: Secondary | ICD-10-CM | POA: Insufficient documentation

## 2024-06-12 DIAGNOSIS — G8929 Other chronic pain: Secondary | ICD-10-CM

## 2024-06-12 MED ORDER — IOHEXOL 180 MG/ML  SOLN
10.0000 mL | Freq: Once | INTRAMUSCULAR | Status: AC
  Administered 2024-06-12: 10 mL via EPIDURAL

## 2024-06-12 MED ORDER — SODIUM CHLORIDE 0.9% FLUSH
2.0000 mL | Freq: Once | INTRAVENOUS | Status: AC
  Administered 2024-06-12: 2 mL

## 2024-06-12 MED ORDER — PENTAFLUOROPROP-TETRAFLUOROETH EX AERO: INHALATION_SPRAY | Freq: Once | CUTANEOUS | Status: AC

## 2024-06-12 MED ORDER — MIDAZOLAM HCL 5 MG/5ML IJ SOLN
0.5000 mg | Freq: Once | INTRAMUSCULAR | Status: AC
  Administered 2024-06-12: 2 mg via INTRAVENOUS

## 2024-06-12 MED ORDER — ROPIVACAINE HCL 2 MG/ML IJ SOLN
INTRAMUSCULAR | Status: AC
  Filled 2024-06-12: qty 20

## 2024-06-12 MED ORDER — LIDOCAINE HCL 2 % IJ SOLN
INTRAMUSCULAR | Status: AC
  Filled 2024-06-12: qty 20

## 2024-06-12 MED ORDER — TRIAMCINOLONE ACETONIDE 40 MG/ML IJ SUSP
INTRAMUSCULAR | Status: AC
  Filled 2024-06-12: qty 1

## 2024-06-12 MED ORDER — LIDOCAINE HCL 2 % IJ SOLN
20.0000 mL | Freq: Once | INTRAMUSCULAR | Status: AC
  Administered 2024-06-12: 400 mg

## 2024-06-12 MED ORDER — MIDAZOLAM HCL 5 MG/5ML IJ SOLN
INTRAMUSCULAR | Status: AC
  Filled 2024-06-12: qty 5

## 2024-06-12 MED ORDER — IOHEXOL 180 MG/ML  SOLN
INTRAMUSCULAR | Status: AC
  Filled 2024-06-12: qty 10

## 2024-06-12 MED ORDER — FENTANYL CITRATE (PF) 100 MCG/2ML IJ SOLN: 25.0000 ug | INTRAMUSCULAR | Status: DC | PRN

## 2024-06-12 MED ORDER — FENTANYL CITRATE (PF) 100 MCG/2ML IJ SOLN
INTRAMUSCULAR | Status: AC
  Filled 2024-06-12: qty 2

## 2024-06-12 MED ORDER — TRIAMCINOLONE ACETONIDE 40 MG/ML IJ SUSP
40.0000 mg | Freq: Once | INTRAMUSCULAR | Status: AC
  Administered 2024-06-12: 40 mg

## 2024-06-12 MED ORDER — ROPIVACAINE HCL 2 MG/ML IJ SOLN
2.0000 mL | Freq: Once | INTRAMUSCULAR | Status: AC
  Administered 2024-06-12: 2 mL via EPIDURAL

## 2024-06-12 MED ORDER — SODIUM CHLORIDE (PF) 0.9 % IJ SOLN
INTRAMUSCULAR | Status: AC
  Filled 2024-06-12: qty 10

## 2024-06-12 NOTE — Patient Instructions (Addendum)
 ______________________________________________________________________    Post-Procedure Discharge Instructions  INSTRUCTIONS Apply ice:  Purpose: This will minimize any swelling and discomfort after procedure.  When: Day of procedure, as soon as you get home. How: Fill a plastic sandwich bag with crushed ice. Cover it with a small towel and apply to injection site. How long: (15 min on, 15 min off) Apply for 15 minutes then remove x 15 minutes.  Repeat sequence on day of procedure, until you go to bed. Apply heat:  Purpose: To treat any soreness and discomfort from the procedure. When: Starting the next day after the procedure. How: Apply heat to procedure site starting the day following the procedure. How long: May continue to repeat daily, until discomfort goes away. Food intake: Start with clear liquids (like water) and advance to regular food, as tolerated.  Physical activities: Keep activities to a minimum for the first 8 hours after the procedure. After that, then as tolerated. Driving: If you have received any sedation, be responsible and do not drive. You are not allowed to drive for 24 hours after having sedation. Blood thinner: (Applies only to those taking blood thinners) You may restart your blood thinner 6 hours after your procedure. Insulin: (Applies only to Diabetic patients taking insulin) As soon as you can eat, you may resume your normal dosing schedule. Infection prevention: Keep procedure site clean and dry. Shower daily and clean area with soap and water.  PAIN DIARY Post-procedure Pain Diary: Extremely important that this be done correctly and accurately. Recorded information will be used to determine the next step in treatment. For the purpose of accuracy, follow these rules: Evaluate only the area treated. Do not report or include pain from an untreated area. For the purpose of this evaluation, ignore all other areas of pain, except for the treated area. After your  procedure, avoid taking a long nap and attempting to complete the pain diary after you wake up. Instead, set your alarm clock to go off every hour, on the hour, for the initial 8 hours after the procedure. Document the duration of the numbing medicine, and the relief you are getting from it. Do not go to sleep and attempt to complete it later. It will not be accurate. If you received sedation, it is likely that you were given a medication that may cause amnesia. Because of this, completing the diary at a later time may cause the information to be inaccurate. This information is needed to plan your care. Follow-up appointment: Keep your post-procedure follow-up evaluation appointment after the procedure (usually 2 weeks for most procedures, 6 weeks for radiofrequencies). DO NOT FORGET to bring you pain diary with you.   EXPECT... (What should I expect to see with my procedure?) From numbing medicine (AKA: Local Anesthetics): Numbness or decrease in pain. You may also experience some weakness, which if present, could last for the duration of the local anesthetic. Onset: Full effect within 15 minutes of injected. Duration: It will depend on the type of local anesthetic used. On the average, 1 to 8 hours.  From steroids (Applies only if steroids were used): Decrease in swelling or inflammation. Once inflammation is improved, relief of the pain will follow. Onset of benefits: Depends on the amount of swelling present. The more swelling, the longer it will take for the benefits to be seen. In some cases, up to 10 days. Duration: Steroids will stay in the system x 2 weeks. Duration of benefits will depend on multiple posibilities including persistent irritating  factors. Side-effects: If present, they may typically last 2 weeks (the duration of the steroids). Frequent: Cramps (if they occur, drink Gatorade and take over-the-counter Magnesium 450-500 mg once to twice a day); water retention with temporary weight  gain; increases in blood sugar; decreased immune system response; increased appetite. Occasional: Facial flushing (red, warm cheeks); mood swings; menstrual changes. Uncommon: Long-term decrease or suppression of natural hormones; bone thinning. (These are more common with higher doses or more frequent use. This is why we prefer that our patients avoid having any injection therapies in other practices.)  Very Rare: Severe mood changes; psychosis; aseptic necrosis. From procedure: Some discomfort is to be expected once the numbing medicine wears off. This should be minimal if ice and heat are applied as instructed.  CALL IF... (When should I call?) You experience numbness and weakness that gets worse with time, as opposed to wearing off. New onset bowel or bladder incontinence. (Applies only to procedures done in the spine)  Emergency Numbers: Durning business hours (Monday - Thursday, 8:00 AM - 4:00 PM) (Friday, 9:00 AM - 12:00 Noon): [phone] After hours: [phone] NOTE: If you are having a problem and are unable connect with, or to talk to a provider, then go to your nearest urgent care or emergency department. If the problem is serious and urgent, please call 911. ______________________________________________________________________     ______________________________________________________________________    Steroid injections  Common steroids for injections Triamcinolone : Used by many sports medicine physicians for large joint and bursal injections, often combined with a local anesthetic like lidocaine . A study focusing on coccydynia (tailbone pain) found triamcinolone  was more effective than betamethasone, suggesting it may also be preferable for other localized inflammation conditions. Methylprednisolone: A common alternative to triamcinolone  that is also a strong anti-inflammatory. It is available in different formulations, with the acetate suspension being the long-acting  option for intra-articular injections. Dexamethasone : This is a non-particulate steroid, meaning it has a lower risk of tissue damage compared to particulate steroids like triamcinolone  and methylprednisolone. While less common for this specific use, it is an option for targeted injections.   Considerations for physicians Particulate vs. non-particulate steroids: Triamcinolone  and methylprednisolone are particulate, meaning they can clump together. Dexamethasone  is non-particulate. Particulate steroids are often preferred for their longer-lasting effects but carry a theoretical higher risk for certain injections (though this is less of a concern in the costochondral joints). Combined injectate: Corticosteroids are typically mixed with a local anesthetic like lidocaine  to provide both immediate pain relief (from the anesthetic) and longer-term inflammation reduction (from the steroid). Imaging guidance: To ensure accurate placement of the needle and medication, physicians may use ultrasound or fluoroscopic guidance for the injection, especially in complex or refractory cases.   Patient guidance Before undergoing a steroid injection, discuss the options with your physician. They will determine the best steroid, dosage, and procedure for your specific case based on factors like: Severity of your condition History of response to other treatments Your overall health status Experience and preference of the physician  Last  Updated: 03/18/2024 ______________________________________________________________________

## 2024-06-12 NOTE — Progress Notes (Signed)
 PROVIDER NOTE: Interpretation of information contained herein should be left to medically-trained personnel. Specific patient instructions are provided elsewhere under Patient Instructions section of medical record. This document was created in part using STT-dictation technology, any transcriptional errors that may result from this process are unintentional.  Patient: Mary Hardin Type: Established DOB: 06-07-1956 MRN: [id number] PCP: Vicci Duwaine SQUIBB, DO  Service: Procedure DOS: 06/12/2024 Setting: Ambulatory Location: Ambulatory outpatient facility Delivery: Face-to-face Provider: Eric DELENA Como, MD Specialty: Interventional Pain Management Specialty designation: 09 Location: Outpatient facility Ref. Prov.: Vicci Duwaine P, DO       Interventional Therapy   Type: Lumbar epidural steroid injection (LESI) (interlaminar) #1    Laterality: Right   Level:  L4-5 Level.  Imaging: Fluoroscopic guidance Spinal ([id number]) Anesthesia: Local anesthesia (1-2% Lidocaine ) Anxiolysis: IV Versed   2.0 mg           Sedation: Minimal Sedation None required. No Fentanyl  administered.         DOS: 06/12/2024  Performed by: Eric DELENA Como, MD  Purpose: Diagnostic/Therapeutic Indications: Lumbar radicular pain of intraspinal etiology of more than 4 weeks that has failed to respond to conservative therapy and is severe enough to impact quality of life or function. 1. 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 Chronic lower extremity pain (Right)   3. Degeneration of intervertebral disc of lumbar region with lower extremity pain   4. Grade 1 Anterolisthesis of (L4/L5)   5. Low back pain of over 3 months duration   6. Low back pain radiating to right leg   7. Multifactorial low back pain    History of allergy to shellfish    NAS-11 Pain score:   Pre-procedure: 10-Worst pain ever/10   Post-procedure: 6 /10      Position / Prep / Materials:  Position: Prone w/ head  of the table raised (slight reverse trendelenburg) to facilitate breathing.  Prep solution: ChloraPrep (2% chlorhexidine gluconate and 70% isopropyl alcohol) Prep Area: Entire Posterior Lumbar Region from lower scapular tip down to mid buttocks area and from flank to flank. Materials:  Tray: Epidural tray Needle(s):  Type: Epidural needle (Tuohy) Gauge (G):  17 Length: Regular (3.5-in) Qty: 1  H&P (Pre-op Assessment):  Mary Hardin is a 68 y.o. (year old), female patient, seen today for interventional treatment. She  has a past surgical history that includes Cholecystectomy; Dilation and curettage of uterus; Varicose vein surgery; Eye surgery; Colonoscopy w/ biopsies and polypectomy; wisdom teeth extraction; Colonoscopy (N/A, 08/31/2017); and Esophagogastroduodenoscopy (N/A, 08/31/2017). Mary Hardin has a current medication list which includes the following prescription(s): acetaminophen, vitamin d3, docusate sodium, epinephrine , escitalopram , lorazepam , losartan , meloxicam , pregabalin , and [START ON 06/20/2024] pregabalin , and the following Facility-Administered Medications: fentanyl . Her primarily concern today is the Back Pain (lower)  Initial Vital Signs:  Pulse/HCG Rate: 73ECG Heart Rate: 67 Temp: (!) 97 F (36.1 C) Resp: 16 BP: (!) 144/98 SpO2: 100 %  BMI: Estimated body mass index is 53.22 kg/m as calculated from the following:   Height as of this encounter: 5' 8 (1.727 m).   Weight as of this encounter: 350 lb (158.8 kg).  Risk Assessment: Allergies: Reviewed. She is allergic to shellfish allergy and shellfish protein-containing drug products.  Allergy Precautions: None required Coagulopathies: Reviewed. None identified.  Blood-thinner therapy: None at this time Active Infection(s): Reviewed. None identified. Mary Hardin is afebrile  Site Confirmation: Mary Hardin was asked to confirm the procedure and laterality before marking the site Procedure checklist: Completed  Consent:  Before the procedure and under the influence of no sedative(s), amnesic(s), or anxiolytics, the patient was informed of the treatment options, risks and possible complications. To fulfill our ethical and legal obligations, as recommended by the American Medical Association's Code of Ethics, I have informed the patient of my clinical impression; the nature and purpose of the treatment or procedure; the risks, benefits, and possible complications of the intervention; the alternatives, including doing nothing; the risk(s) and benefit(s) of the alternative treatment(s) or procedure(s); and the risk(s) and benefit(s) of doing nothing. The patient was provided information about the general risks and possible complications associated with the procedure. These may include, but are not limited to: failure to achieve desired goals, infection, bleeding, organ or nerve damage, allergic reactions, paralysis, and death. In addition, the patient was informed of those risks and complications associated to Spine-related procedures, such as failure to decrease pain; infection (i.e.: Meningitis, epidural or intraspinal abscess); bleeding (i.e.: epidural hematoma, subarachnoid hemorrhage, or any other type of intraspinal or peri-dural bleeding); organ or nerve damage (i.e.: Any type of peripheral nerve, nerve root, or spinal cord injury) with subsequent damage to sensory, motor, and/or autonomic systems, resulting in permanent pain, numbness, and/or weakness of one or several areas of the body; allergic reactions; (i.e.: anaphylactic reaction); and/or death. Furthermore, the patient was informed of those risks and complications associated with the medications. These include, but are not limited to: allergic reactions (i.e.: anaphylactic or anaphylactoid reaction(s)); adrenal axis suppression; blood sugar elevation that in diabetics may result in ketoacidosis or comma; water retention that in patients with history of congestive heart  failure may result in shortness of breath, pulmonary edema, and decompensation with resultant heart failure; weight gain; swelling or edema; medication-induced neural toxicity; particulate matter embolism and blood vessel occlusion with resultant organ, and/or nervous system infarction; and/or aseptic necrosis of one or more joints. Finally, the patient was informed that Medicine is not an exact science; therefore, there is also the possibility of unforeseen or unpredictable risks and/or possible complications that may result in a catastrophic outcome. The patient indicated having understood very clearly. We have given the patient no guarantees and we have made no promises. Enough time was given to the patient to ask questions, all of which were answered to the patient's satisfaction. Ms. Brillhart has indicated that she wanted to continue with the procedure. Attestation: I, the ordering provider, attest that I have discussed with the patient the benefits, risks, side-effects, alternatives, likelihood of achieving goals, and potential problems during recovery for the procedure that I have provided informed consent. Date  Time: 06/12/2024 10:17 AM  Pre-Procedure Preparation:  Monitoring: As per clinic protocol. Respiration, ETCO2, SpO2, BP, heart rate and rhythm monitor placed and checked for adequate function Safety Precautions: Patient was assessed for positional comfort and pressure points before starting the procedure. Time-out: I initiated and conducted the Time-out before starting the procedure, as per protocol. The patient was asked to participate by confirming the accuracy of the Time Out information. Verification of the correct person, site, and procedure were performed and confirmed by me, the nursing staff, and the patient. Time-out conducted as per Joint Commission's Universal Protocol (UP.01.01.01). Time: 1215 Start Time: 1215 hrs.  Description/Narrative of Procedure:          Target:  Epidural space via interlaminar opening, initially targeting the lower laminar border of the superior vertebral body. Region: Lumbar Approach: Percutaneous paravertebral  Rationale (medical necessity): procedure needed and proper for the diagnosis and/or  treatment of the patient's medical symptoms and needs. Procedural Technique Safety Precautions: Aspiration looking for blood return was conducted prior to all injections. At no point did we inject any substances, as a needle was being advanced. No attempts were made at seeking any paresthesias. Safe injection practices and needle disposal techniques used. Medications properly checked for expiration dates. SDV (single dose vial) medications used. Description of the Procedure: Protocol guidelines were followed. The procedure needle was introduced through the skin, ipsilateral to the reported pain, and advanced to the target area. Bone was contacted and the needle walked caudad, until the lamina was cleared. The epidural space was identified using "loss-of-resistance technique" with 2-3 ml of PF-NaCl (0.9% NSS), in a 5cc LOR glass syringe.  Vitals:   06/12/24 1215 06/12/24 1220 06/12/24 1225 06/12/24 1230  BP: (!) 147/106 (!) 147/102 (!) 147/101 122/85  Pulse:      Resp: 15 16 17 16   Temp:    (!) 97.4 F (36.3 C)  TempSrc:    Temporal  SpO2: 100% 100% 100% 100%  Weight:      Height:        Start Time: 1215 hrs. End Time: 1223 hrs.  Imaging Guidance (Spinal):          Type of Imaging Technique: Fluoroscopy Guidance (Spinal) Indication(s): Fluoroscopy guidance for needle placement to enhance accuracy in procedures requiring precise needle localization for targeted delivery of medication in or near specific anatomical locations not easily accessible without such real-time imaging assistance. Exposure Time: Please see nurses notes. Contrast: Before injecting any contrast, we confirmed that the patient did not have an allergy to iodine,  shellfish, or radiological contrast. Once satisfactory needle placement was completed at the desired level, radiological contrast was injected. Contrast injected under live fluoroscopy. No contrast complications. See chart for type and volume of contrast used. Fluoroscopic Guidance: I was personally present during the use of fluoroscopy. Tunnel Vision Technique used to obtain the best possible view of the target area. Parallax error corrected before commencing the procedure. Direction-depth-direction technique used to introduce the needle under continuous pulsed fluoroscopy. Once target was reached, antero-posterior, oblique, and lateral fluoroscopic projection used confirm needle placement in all planes. Images permanently stored in EMR. Interpretation: I personally interpreted the imaging intraoperatively. Adequate needle placement confirmed in multiple planes. Appropriate spread of contrast into desired area was observed. No evidence of afferent or efferent intravascular uptake. No intrathecal or subarachnoid spread observed. Permanent images saved into the patient's record.  Antibiotic Prophylaxis:   Anti-infectives (From admission, onward)    None      Indication(s): None identified  Post-operative Assessment:  Post-procedure Vital Signs:  Pulse/HCG Rate: 7387 Temp: (!) 97.4 F (36.3 C) Resp: 16 BP: 122/85 SpO2: 100 %  EBL: None  Complications: No immediate post-treatment complications observed by team, or reported by patient.  Note: The patient tolerated the entire procedure well. A repeat set of vitals were taken after the procedure and the patient was kept under observation following institutional policy, for this type of procedure. Post-procedural neurological assessment was performed, showing return to baseline, prior to discharge. The patient was provided with post-procedure discharge instructions, including a section on how to identify potential problems. Should any  problems arise concerning this procedure, the patient was given instructions to immediately contact us , at any time, without hesitation. In any case, we plan to contact the patient by telephone for a follow-up status report regarding this interventional procedure.  Comments:  No additional relevant information.  Plan of Care (POC)  Orders:  Orders Placed This Encounter  Procedures   Lumbar Epidural Injection    Scheduling Instructions:     Procedure: Interlaminar LESI L4-5     Laterality: Right     Sedation: With Sedation     Date: 06/12/2024    Where will this procedure be performed?:   [facility]   [facility] C-ARM 1-60 MIN NO REPORT    Intraoperative interpretation by procedural physician at [facility].    Standing Status:   Standing    Number of Occurrences:   1    Reason for exam::   Assistance in needle guidance and placement for procedures requiring needle placement in or near specific anatomical locations not easily accessible without such assistance.   Informed Consent Details: Physician/Practitioner Attestation; Transcribe to consent form and obtain patient signature    Note: Always confirm laterality of pain with Ms. Gutierrez, before procedure. Transcribe to consent form and obtain patient signature.    Physician/Practitioner attestation of informed consent for procedure/surgical case:   I, the physician/practitioner, attest that I have discussed with the patient the benefits, risks, side effects, alternatives, likelihood of achieving goals and potential problems during recovery for the procedure that I have provided informed consent.    Procedure:   Lumbar epidural steroid injection under fluoroscopic guidance    Physician/Practitioner performing the procedure:   Audy Dauphine A. Tanya, MD    Indication/Reason:   Low back and/or lower extremity pain secondary to lumbar radiculitis   Provide equipment / supplies at bedside    Procedural tray: Epidural  Tray (Disposable  single use) Skin infiltration needle: Regular 1.5-in, 25-G, (x1) Block needle size: Regular standard Catheter: No catheter required    Standing Status:   Standing    Number of Occurrences:   1    Specify:   Epidural Tray   Saline lock IV    Have LR [phone] mL available and administer at 125 mL/hr if patient becomes hypotensive.    Standing Status:   Standing    Number of Occurrences:   1   Miscellanous precautions    NOTE: Although It is true that patients can have allergies to shellfish and that shellfish contain iodine, most shellfish  allergies are due to two protein allergens present in the shellfish: tropomyosins and parvalbumin. Not all patients with shellfish allergies are allergic to iodine. However, as a precaution, avoid using iodine containing products.    Standing Status:   Standing    Number of Occurrences:   1     Opioid Analgesic: None MME/day: 0 mg/day    Medications ordered for procedure: Meds ordered this encounter  Medications   iohexol  (OMNIPAQUE ) 180 MG/ML injection 10 mL    Must be Myelogram-compatible. If not available, you may substitute with a water-soluble, non-ionic, hypoallergenic, myelogram-compatible radiological contrast medium.   lidocaine  (XYLOCAINE ) 2 % (with pres) injection 400 mg   pentafluoroprop-tetrafluoroeth (GEBAUERS) aerosol   midazolam (VERSED) 5 MG/5ML injection 0.5-2 mg    Make sure Flumazenil is available in the pyxis when using this medication. If oversedation occurs, administer 0.2 mg IV over 15 sec. If after 45 sec no response, administer 0.2 mg again over 1 min; may repeat at 1 min intervals; not to exceed 4 doses (1 mg)   fentaNYL (SUBLIMAZE) injection 25-50 mcg    Make sure Narcan is available in the pyxis when using this medication. In the event of respiratory depression (RR< 8/min): Titrate NARCAN (naloxone)  in increments of 0.1 to 0.2 mg IV at 2-3 minute intervals, until desired degree of reversal.   sodium  chloride flush (NS) 0.9 % injection 2 mL   ropivacaine (PF) 2 mg/mL (0.2%) (NAROPIN) injection 2 mL   triamcinolone acetonide (KENALOG-40) injection 40 mg   Medications administered: We administered iohexol , lidocaine , pentafluoroprop-tetrafluoroeth, midazolam, sodium chloride flush, ropivacaine (PF) 2 mg/mL (0.2%), and triamcinolone acetonide.  See the medical record for exact dosing, route, and time of administration.    Interventional Therapies  Risk Factors  Considerations  Medical Comorbidities:  MO (BMI>50) (350 lbs)  BA  GERD  HTN  OSA  Anxiety/Depression  Hx. Breast Cancer     Planned  Pending:   Diagnostic right L4-5 LESI #1 (06/12/2024)    Under consideration:   Diagnostic right L4-5 LESI #1    Completed: (Analgesic benefit)1  None at this time   Therapeutic  Palliative (PRN) options:   None established   Completed by other providers:   Diagnostic/therapeutic bilateral IA steroid/gel knee injections by [facility] every 3 months   1(Analgesic benefit): Expressed in percentage (%). (Local anesthetic[LA] +/- sedation  L.A.Local Anesthetic  Steroid benefit  Ongoing benefit)      Follow-up plan:   Return in about 2 weeks (around 06/26/2024) for (Face2F), (PPE).     Recent Visits Date Type Provider Dept  06/09/24 Office Visit Tanya Glisson, MD [facility]  06/02/24 Office Visit Tanya Glisson, MD [facility]  Showing recent visits within past 90 days and meeting all other requirements Today's Visits Date Type Provider Dept  06/12/24 Procedure visit Tanya Glisson, MD [facility]  Showing today's visits and meeting all other requirements Future Appointments Date Type Provider Dept  07/02/24 Appointment Tanya Glisson, MD [facility]  Showing future appointments within next 90 days and meeting all other requirements   Disposition: Discharge home  Discharge (Date  Time): 06/12/2024; 1239 hrs.    Primary Care Physician: Vicci Duwaine SQUIBB, DO Location: [facility] Note by: Glisson DELENA Tanya, MD (TTS technology used. I apologize for any typographical errors that were not detected and corrected.) Date: 06/12/2024; Time: 2:55 PM  Disclaimer:  Medicine is not an visual merchandiser. The only guarantee in medicine is that nothing is guaranteed. It is important to note that the decision to proceed with this intervention was based on the information collected from the patient. The Data and conclusions were drawn from the patient's questionnaire, the interview, and the physical examination. Because the information was provided in large part by the patient, it cannot be guaranteed that it has not been purposely or unconsciously manipulated. Every effort has been made to obtain as much relevant data as possible for this evaluation. It is important to note that the conclusions that lead to this procedure are derived in large part from the available data. Always take into account that the treatment will also be dependent on availability of resources and existing treatment guidelines, considered by other Pain Management Practitioners as being common knowledge and practice, at the time of the intervention. For Medico-Legal purposes, it is also important to point out that variation in procedural techniques and pharmacological choices are the acceptable norm. The indications, contraindications, technique, and results of the above procedure should only be interpreted and judged by a Board-Certified Interventional Pain Specialist with extensive familiarity and expertise in the same exact procedure and technique.

## 2024-06-13 ENCOUNTER — Telehealth: Payer: Self-pay

## 2024-06-13 NOTE — Telephone Encounter (Signed)
 Post procedure follow up.  LM

## 2024-06-23 ENCOUNTER — Ambulatory Visit: Admitting: Pain Medicine

## 2024-06-24 ENCOUNTER — Other Ambulatory Visit: Payer: Self-pay | Admitting: Family Medicine

## 2024-06-24 DIAGNOSIS — F339 Major depressive disorder, recurrent, unspecified: Secondary | ICD-10-CM

## 2024-06-25 ENCOUNTER — Ambulatory Visit: Admitting: Family Medicine

## 2024-06-25 ENCOUNTER — Encounter: Payer: Self-pay | Admitting: Family Medicine

## 2024-06-25 VITALS — BP 108/63 | HR 62 | Temp 98.4°F | Ht 68.0 in | Wt 343.0 lb

## 2024-06-25 DIAGNOSIS — G894 Chronic pain syndrome: Secondary | ICD-10-CM | POA: Diagnosis not present

## 2024-06-25 DIAGNOSIS — M545 Low back pain, unspecified: Secondary | ICD-10-CM

## 2024-06-25 DIAGNOSIS — Z6841 Body Mass Index (BMI) 40.0 and over, adult: Secondary | ICD-10-CM

## 2024-06-25 DIAGNOSIS — F509 Eating disorder, unspecified: Secondary | ICD-10-CM | POA: Diagnosis not present

## 2024-06-25 DIAGNOSIS — G4733 Obstructive sleep apnea (adult) (pediatric): Secondary | ICD-10-CM | POA: Diagnosis not present

## 2024-06-25 DIAGNOSIS — K760 Fatty (change of) liver, not elsewhere classified: Secondary | ICD-10-CM

## 2024-06-25 NOTE — Progress Notes (Signed)
 Office: [phone]  /  Fax: [phone]  WEIGHT SUMMARY AND BIOMETRICS  Starting Date: 04/17/24  Starting Weight: 343lb   Weight Lost Since Last Visit: 0lb   Vitals Temp: 98.4 F (36.9 C) BP: 108/63 Pulse Rate: 62 SpO2: 98 %   Body Composition  Body Fat %: 60.1 % Fat Mass (lbs): 206.2 lbs Muscle Mass (lbs): 130 lbs Visceral Fat Rating : 25    HPI  Chief Complaint: OBESITY  Mary Hardin is here to discuss her progress with her obesity treatment plan. She is on the the Category 2 Plan and states she is following her eating plan approximately 10 % of the time. She states she is exercising 0 minutes 0 times per week.  Interval History:  Since last office visit she is down 0 lb She has not been seen in >2 mos She completed PT and had an exacerbation of lumbar DDD since last visit She was using compounded semaglutide  2.4 mg weekly though reports that some of it did freeze She wasn't having much satiety or GI upset (she did run out) Her mom is supportive She has been a lot less active due to back pain  Pharmacotherapy: none  PHYSICAL EXAM:  Blood pressure 108/63, pulse 62, temperature 98.4 F (36.9 C), height 5' 8 (1.727 m), weight (!) 343 lb (155.6 kg), SpO2 98%. Body mass index is 52.15 kg/m.  General: She is overweight, cooperative, alert, well developed, and in no acute distress. PSYCH: Has normal mood, affect and thought process.   Lungs: Normal breathing effort, no conversational dyspnea.  ASSESSMENT AND PLAN  TREATMENT PLAN FOR OBESITY:  Recommended Dietary Goals  Mary Hardin is currently in the action stage of change. As such, her goal is to continue weight management plan. She has agreed to the Category 3 Plan.  Behavioral Intervention  We discussed the following Behavioral Modification Strategies today: increasing lean protein intake to established goals, avoiding skipping meals, increasing water intake , keeping healthy foods at home, work on managing  stress, creating time for self-care and relaxation, continue to practice mindfulness when eating, and planning for success.  Additional resources provided today: NA  Recommended Physical Activity Goals  Mary Hardin has been advised to work up to 150 minutes of moderate intensity aerobic activity a week and strengthening exercises 2-3 times per week for cardiovascular health, weight loss maintenance and preservation of muscle mass.   She has agreed to Think about enjoyable ways to increase daily physical activity and overcoming barriers to exercise, Increase physical activity in their day and reduce sedentary time (increase NEAT)., and [facility]  Pharmacotherapy changes for the treatment of obesity: none  ASSOCIATED CONDITIONS ADDRESSED TODAY  Eating disorder, unspecified type She reports a long hx of emotional eating and has never done counseling for this Begin CBT with [facility] -     Ambulatory referral to Psychology  OSA (obstructive sleep apnea) Using CPAP nightly for OSA Continue active plan for weight loss Consider use of Zepbound  Due for repeat PSG  NAFLD (nonalcoholic fatty liver disease) Fibrosis 4 Score = 1.05  Fib-4 interpretation is not validated for people under 35 or over 81 years of age. However, scores under 2.0 are generally considered low risk.  Continue to work on weight loss with a low sugar/ low saturated fat diet  Chronic pain syndrome Unchanged Follows with pain management  Chronic lumbar spine pain Worsened since last visit  This lead to 6.6 lb of  muscle loss  She has completed PT and pain is improving s/p corticosteroid injection Ready to begin water exercise 2 days/ wk  Morbid obesity (HCC)  BMI 50.0-59.9, adult [facility])      She was informed of the importance of frequent follow up visits to maximize her success with intensive lifestyle modifications for her multiple health conditions.   ATTESTASTION  STATEMENTS:  Reviewed by clinician on day of visit: allergies, medications, problem list, medical history, surgical history, family history, social history, and previous encounter notes pertinent to obesity diagnosis.   I have personally spent 31 minutes total time today in preparation, patient care, nutritional counseling and education,  and documentation for this visit, including the following: review of most recent clinical lab tests,  reviewing medical assistant documentation, review and interpretation of bioimpedence results.     [name], D.O. DABFM, DABOM [facility] [address] [place], [place] [address] [phone]

## 2024-06-26 NOTE — Telephone Encounter (Signed)
 Requested Prescriptions  Pending Prescriptions Disp Refills   meloxicam  (MOBIC ) 15 MG tablet [Pharmacy Med Name: MELOXICAM  15MG  TABLETS] 90 tablet 0    Sig: TAKE 1 TABLET(15 MG) BY MOUTH DAILY     Analgesics:  COX2 Inhibitors Failed - 06/26/2024  4:10 PM      Failed - Manual Review: Labs are only required if the patient has taken medication for more than 8 weeks.      Passed - HGB in normal range and within 360 days    Hemoglobin  Date Value Ref Range Status  04/17/2024 12.1 11.1 - 15.9 g/dL Final         Passed - Cr in normal range and within 360 days    Creatinine, Ser  Date Value Ref Range Status  04/17/2024 0.83 0.57 - 1.00 mg/dL Final         Passed - HCT in normal range and within 360 days    Hematocrit  Date Value Ref Range Status  04/17/2024 39.2 34.0 - 46.6 % Final         Passed - AST in normal range and within 360 days    AST  Date Value Ref Range Status  04/17/2024 16 0 - 40 IU/L Final         Passed - ALT in normal range and within 360 days    ALT  Date Value Ref Range Status  04/17/2024 13 0 - 32 IU/L Final         Passed - eGFR is 30 or above and within 360 days    GFR calc Af Amer  Date Value Ref Range Status  06/24/2020 105 >59 mL/min/1.73 Final    Comment:    **In accordance with recommendations from the NKF-ASN Task force,**   [facility] is in the process of updating its eGFR calculation to the   2021 CKD-EPI creatinine equation that estimates kidney function   without a race variable.    GFR calc non Af Amer  Date Value Ref Range Status  06/24/2020 91 >59 mL/min/1.73 Final   eGFR  Date Value Ref Range Status  04/17/2024 78 >59 mL/min/1.73 Final         Passed - Patient is not pregnant      Passed - Valid encounter within last 12 months    Recent Outpatient Visits           1 month ago Right sided sciatica   [place] [facility] [place], [place] P, DO   2 months ago Right sided sciatica   [place] [facility] [place], Megan P, DO   3 months ago Right sided sciatica   [place] [facility] [place], Megan P, DO   4 months ago Primary hypertension   [place] [facility] [place], [place] P, DO   8 months ago OSA (obstructive sleep apnea)   [place] [facility], Megan P, DO               losartan  (COZAAR ) 25 MG tablet [Pharmacy Med Name: LOSARTAN  25MG  TABLETS] 90 tablet 0    Sig: TAKE 1 TABLET(25 MG) BY MOUTH DAILY     Cardiovascular:  Angiotensin Receptor Blockers Passed - 06/26/2024  4:10 PM      Passed - Cr in normal range and within 180 days    Creatinine, Ser  Date Value Ref Range Status  04/17/2024 0.83 0.57 - 1.00 mg/dL Final  Passed - K in normal range and within 180 days    Potassium  Date Value Ref Range Status  04/17/2024 4.9 3.5 - 5.2 mmol/L Final         Passed - Patient is not pregnant      Passed - Last BP in normal range    BP Readings from Last 1 Encounters:  06/25/24 108/63         Passed - Valid encounter within last 6 months    Recent Outpatient Visits           1 month ago Right sided sciatica   [place] [facility] [place], [place] P, DO   2 months ago Right sided sciatica   [place] [facility] [place], Megan P, DO   3 months ago Right sided sciatica   [facility] [place], Megan P, DO   4 months ago Primary hypertension   [place] [facility] [place], [place] P, DO   8 months ago OSA (obstructive sleep apnea)   [place] [facility], Megan P, DO               escitalopram  (LEXAPRO ) 20 MG tablet [Pharmacy Med Name: ESCITALOPRAM  20MG  TABLETS] 90 tablet 0    Sig: TAKE 1 TABLET(20 MG) BY MOUTH DAILY     Psychiatry:  Antidepressants - SSRI Passed - 06/26/2024  4:10 PM      Passed - Completed PHQ-2 or PHQ-9 in the last 360 days      Passed - Valid encounter within last 6 months    Recent  Outpatient Visits           1 month ago Right sided sciatica   [place] [facility] [place], [place] P, DO   2 months ago Right sided sciatica   [place] [facility] [place], Megan P, DO   3 months ago Right sided sciatica   [place] [facility] [place], Megan P, DO   4 months ago Primary hypertension   [place] [facility] [place], [place] P, DO   8 months ago OSA (obstructive sleep apnea)    [place] [place], Megan P, DO

## 2024-06-30 ENCOUNTER — Telehealth: Payer: Self-pay | Admitting: *Deleted

## 2024-06-30 NOTE — Telephone Encounter (Signed)
 Sent patient a message regarding her referral to Psychology.

## 2024-07-01 NOTE — Progress Notes (Unsigned)
 PROVIDER NOTE: Interpretation of information contained herein should be left to medically-trained personnel. Specific patient instructions are provided elsewhere under Patient Instructions section of medical record. This document was created in part using AI and STT-dictation technology, any transcriptional errors that may result from this process are unintentional.  Patient: Mary Hardin  Service: E/M   PCP: Vicci Duwaine SQUIBB, DO  DOB: 11-22-1955  DOS: 07/02/2024  Provider: Eric DELENA Como, MD  MRN: [id number]  Delivery: Face-to-face  Specialty: Interventional Pain Management  Type: Established Patient  Setting: Ambulatory outpatient facility  Specialty designation: 09  Referring Prov.: Vicci Duwaine SQUIBB, DO  Location: Outpatient office facility       History of present illness (HPI) Ms. Mary Hardin, a 68 y.o. year old female, is here today because of her Chronic bilateral low back pain with right-sided sciatica [G89.29, M54.41]. [name] primary complain today is No chief complaint on file.  Pertinent problems: [name] has Decreased ROM of intervertebral discs of lumbar spine; Impaired functional mobility, balance, gait, and endurance; Chronic lumbar spine pain; Osteoarthritis of knees (Bilateral); Chronic pain syndrome; Chronic hip pain (Right); Chronic knee pain (Bilateral); 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); Chronic lower extremity pain (Right); Lumbar facet hypertrophy; Lumbar facet joint pain; Lumbar facet joint syndrome; Osteoarthritis involving multiple joints; DDD (degenerative disc disease), lumbar; Lumbar facet arthropathy (Multilevel) (Bilateral); Tricompartment osteoarthritis of knees (Bilateral); Low back pain of over 3 months duration; Low back pain radiating to right leg; Multifactorial low back pain; Mechanical low back pain; DDD (degenerative disc disease), thoracic; Grade 1 Anterolisthesis of (L4/L5); Osteopenia determined by x-ray; and Abnormal  MRI, lumbar spine (06/04/2024) on their pertinent problem list.  Pain Assessment: Severity of   is reported as a  /10. Location:    / . Onset:  . Quality:  . Timing:  . Modifying factor(s):  SABRA Vitals:  vitals were not taken for this visit.  BMI: Estimated body mass index is 52.15 kg/m as calculated from the following:   Height as of 06/25/24: 5' 8 (1.727 m).   Weight as of 06/25/24: 343 lb (155.6 kg).  Last encounter: 06/09/2024. Last procedure: 06/12/2024.  Reason for encounter: post-procedure evaluation and assessment.   Discussed the use of AI scribe software for clinical note transcription with the patient, who gave verbal consent to proceed.  History of Present Illness          Post-Procedure Evaluation   Type: Lumbar epidural steroid injection (LESI) (interlaminar) #1    Laterality: Right   Level:  L4-5 Level.  Imaging: Fluoroscopic guidance Spinal ([id number]) Anesthesia: Local anesthesia (1-2% Lidocaine ) Anxiolysis: IV Versed   2.0 mg           Sedation: Minimal Sedation None required. No Fentanyl  administered.         DOS: 06/12/2024  Performed by: Eric DELENA Como, MD  Purpose: Diagnostic/Therapeutic Indications: Lumbar radicular pain of intraspinal etiology of more than 4 weeks that has failed to respond to conservative therapy and is severe enough to impact quality of life or function. 1. 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 Chronic lower extremity pain (Right)   3. Degeneration of intervertebral disc of lumbar region with lower extremity pain   4. Grade 1 Anterolisthesis of (L4/L5)   5. Low back pain of over 3 months duration   6. Low back pain radiating to right leg   7. Multifactorial low back pain    History of allergy to  shellfish    NAS-11 Pain score:   Pre-procedure: 10-Worst pain ever/10   Post-procedure: 6 /10     Effectiveness:  Initial hour after procedure:   ***. Subsequent 4-6 hours post-procedure:   ***. Analgesia past  initial 6 hours:   ***. Ongoing improvement:  Analgesic:  *** Function:    ***    ROM:    ***    Interpretation: ***  Pharmacotherapy Assessment   Opioid Analgesic: None MME/day: 0 mg/day   Monitoring: [place] PMP: PDMP reviewed during this encounter.       Pharmacotherapy: No side-effects or adverse reactions reported. Compliance: No problems identified. Effectiveness: Clinically acceptable.  No notes on file  UDS:  Summary  Date Value Ref Range Status  06/02/2024 FINAL  Final    Comment:    ==================================================================== Compliance Drug Analysis, Ur ==================================================================== Test                             Result       Flag       Units  Drug Present and Declared for Prescription Verification   Pregabalin                      PRESENT      EXPECTED   Citalopram                     PRESENT      EXPECTED   Desmethylcitalopram            PRESENT      EXPECTED    Desmethylcitalopram is an expected metabolite of citalopram or the    enantiomeric form, escitalopram .    Acetaminophen                  PRESENT      EXPECTED  Drug Absent but Declared for Prescription Verification   Lorazepam                       Not Detected UNEXPECTED ng/mg creat   Baclofen                        Not Detected UNEXPECTED ==================================================================== Test                      Result    Flag   Units      Ref Range   Creatinine              79               mg/dL      >=79 ==================================================================== Declared Medications:  The flagging and interpretation on this report are based on the  following declared medications.  Unexpected results may arise from  inaccuracies in the declared medications.   **Note: The testing scope of this panel includes these medications:   Baclofen  (Lioresal )  Escitalopram  (Lexapro )  Lorazepam  (Ativan )   Pregabalin  (Lyrica )   **Note: The testing scope of this panel does not include small to  moderate amounts of these reported medications:   Acetaminophen (Tylenol)   **Note: The testing scope of this panel does not include the  following reported medications:   Docusate (Colace)  Epinephrine  (EpiPen )  Losartan  (Cozaar )  Meloxicam  (Mobic )  Valacyclovir (Valtrex)  Vitamin D3 ==================================================================== For clinical consultation, please call [phone]. ====================================================================     No results found for:  CBDTHCR No results found for: D8THCCBX No results found for: D9THCCBX  ROS  Constitutional: Denies any fever or chills Gastrointestinal: No reported hemesis, hematochezia, vomiting, or acute GI distress Musculoskeletal: Denies any acute onset joint swelling, redness, loss of ROM, or weakness Neurological: No reported episodes of acute onset apraxia, aphasia, dysarthria, agnosia, amnesia, paralysis, loss of coordination, or loss of consciousness  Medication Review  EPINEPHrine , LORazepam , Vitamin D3, acetaminophen, docusate sodium, escitalopram , losartan , meloxicam , and pregabalin   History Review  Allergy: Ms. Tuccillo is allergic to shellfish allergy and shellfish protein-containing drug products. Drug: Ms. Mee  reports no history of drug use. Alcohol:  reports current alcohol use. Tobacco:  reports that she has never smoked. She has never used smokeless tobacco. Social: Ms. Asmus  reports that she has never smoked. She has never used smokeless tobacco. She reports current alcohol use. She reports that she does not use drugs. Medical:  has a past medical history of Anemia, Anxiety, Arthritis, Asthma, Depression, Family history of adverse reaction to anesthesia, Family history of malignant neoplasm of breast, Fatty liver, GERD (gastroesophageal reflux disease), Headache, Hernia of  abdominal wall, Hypertension, Joint pain, Multiple food allergies, PONV (postoperative nausea and vomiting), and Sleep apnea. Surgical: Ms. Pociask  has a past surgical history that includes Cholecystectomy; Dilation and curettage of uterus; Varicose vein surgery; Eye surgery; Colonoscopy w/ biopsies and polypectomy; wisdom teeth extraction; Colonoscopy (N/A, 08/31/2017); and Esophagogastroduodenoscopy (N/A, 08/31/2017). Family: family history includes Arrhythmia in her mother; Breast cancer in her paternal aunt; Breast cancer (age of onset: 18) in her maternal aunt; Breast cancer (age of onset: 60) in her maternal grandmother; Breast cancer (age of onset: 1) in her mother; Cancer in her brother; Diabetes in her maternal grandmother; Heart disease in her mother; Hyperlipidemia in her father; Hypertension in her father and mother; Pancreatic cancer (age of onset: 73) in her paternal grandmother; Seizures in her father; Stroke in her father; Thyroid cancer in her cousin; Thyroid disease in her mother.  Laboratory Chemistry Profile   Renal Lab Results  Component Value Date   BUN 16 04/17/2024   CREATININE 0.83 04/17/2024   BCR 19 04/17/2024   GFRAA 105 06/24/2020   GFRNONAA 91 06/24/2020    Hepatic Lab Results  Component Value Date   AST 16 04/17/2024   ALT 13 04/17/2024   ALBUMIN 4.2 04/17/2024   ALKPHOS 100 04/17/2024    Electrolytes Lab Results  Component Value Date   NA 141 04/17/2024   K 4.9 04/17/2024   CL 102 04/17/2024   CALCIUM 9.5 04/17/2024   MG 2.3 06/02/2024    Bone Lab Results  Component Value Date   25OHVITD1 85 06/02/2024   25OHVITD2 1.0 06/02/2024   25OHVITD3 84 06/02/2024    Inflammation (CRP: Acute Phase) (ESR: Chronic Phase) Lab Results  Component Value Date   CRP 27 (H) 06/02/2024   ESRSEDRATE 83 (H) 06/02/2024         Note: Above Lab results reviewed.  Recent Imaging Review  DG PAIN CLINIC C-ARM 1-60 MIN NO REPORT Fluoro was used, but no Radiologist  interpretation will be provided.  Please refer to NOTES tab for provider progress note. Note: Reviewed        Physical Exam  Vitals: There were no vitals taken for this visit. BMI: Estimated body mass index is 52.15 kg/m as calculated from the following:   Height as of 06/25/24: 5' 8 (1.727 m).   Weight as of 06/25/24: 343 lb (155.6 kg). Ideal: Ideal body  weight: 63.9 kg (140 lb 14 oz) Adjusted ideal body weight: 100.6 kg (221 lb 11.6 oz) General appearance: Well nourished, well developed, and well hydrated. In no apparent acute distress Mental status: Alert, oriented x 3 (person, place, & time)       Respiratory: No evidence of acute respiratory distress Eyes: PERLA   Assessment   Diagnosis Status  1. 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 Chronic lower extremity pain (Right)   3. Low back pain of over 3 months duration   4. Low back pain radiating to right leg   5. Multifactorial low back pain   6. Postop check    Controlled Controlled Controlled   Updated Problems: No problems updated.  Plan of Care  Problem-specific:  Assessment and Plan            Ms. CHARMEL PRONOVOST has a current medication list which includes the following long-term medication(s): escitalopram , losartan , pregabalin , and pregabalin .  Pharmacotherapy (Medications Ordered): No orders of the defined types were placed in this encounter.  Orders:  No orders of the defined types were placed in this encounter.    Interventional Therapies  Risk Factors  Considerations  Medical Comorbidities:  MO (BMI>50) (350 lbs)  BA  GERD  HTN  OSA  Anxiety/Depression  Hx. Breast Cancer     Planned  Pending:   Diagnostic right L4-5 LESI #1 (06/12/2024)    Under consideration:   Diagnostic right L4-5 LESI #1    Completed: (Analgesic benefit)1  None at this time   Therapeutic  Palliative (PRN) options:   None established   Completed by other providers:    Diagnostic/therapeutic bilateral IA steroid/gel knee injections by [facility] every 3 months   1(Analgesic benefit): Expressed in percentage (%). (Local anesthetic[LA] +/- sedation  L.A.Local Anesthetic  Steroid benefit  Ongoing benefit)     No follow-ups on file.    Recent Visits Date Type Provider Dept  06/12/24 Procedure visit Tanya Glisson, MD [facility]  06/09/24 Office Visit Tanya Glisson, MD [facility]  06/02/24 Office Visit Tanya Glisson, MD [facility]  Showing recent visits within past 90 days and meeting all other requirements Future Appointments Date Type Provider Dept  07/02/24 Appointment Tanya Glisson, MD [facility]  Showing future appointments within next 90 days and meeting all other requirements  I discussed the assessment and treatment plan with the patient. The patient was provided an opportunity to ask questions and all were answered. The patient agreed with the plan and demonstrated an understanding of the instructions.  Patient advised to call back or seek an in-person evaluation if the symptoms or condition worsens.  Duration of encounter: *** minutes.  Total time on encounter, as per AMA guidelines included both the face-to-face and non-face-to-face time personally spent by the physician and/or other qualified health care professional(s) on the day of the encounter (includes time in activities that require the physician or other qualified health care professional and does not include time in activities normally performed by clinical staff). Physician's time may include the following activities when performed: Preparing to see the patient (e.g., pre-charting review of records, searching for previously ordered imaging, lab work, and nerve conduction tests) Review of prior analgesic pharmacotherapies. Reviewing PMP Interpreting ordered tests (e.g., lab work, imaging, nerve conduction  tests) Performing post-procedure evaluations, including interpretation of diagnostic procedures Obtaining and/or reviewing separately obtained history Performing a medically appropriate examination and/or evaluation Counseling and educating the patient/family/caregiver  Ordering medications, tests, or procedures Referring and communicating with other health care professionals (when not separately reported) Documenting clinical information in the electronic or other health record Independently interpreting results (not separately reported) and communicating results to the patient/ family/caregiver Care coordination (not separately reported)  Note by: Eric DELENA Como, MD (TTS and AI technology used. I apologize for any typographical errors that were not detected and corrected.) Date: 07/02/2024; Time: 6:09 PM

## 2024-07-02 ENCOUNTER — Ambulatory Visit: Attending: Pain Medicine | Admitting: Pain Medicine

## 2024-07-02 ENCOUNTER — Encounter: Payer: Self-pay | Admitting: Pain Medicine

## 2024-07-02 VITALS — BP 131/77 | HR 63 | Temp 97.8°F | Resp 16 | Ht 68.0 in | Wt 343.0 lb

## 2024-07-02 DIAGNOSIS — M5441 Lumbago with sciatica, right side: Secondary | ICD-10-CM | POA: Insufficient documentation

## 2024-07-02 DIAGNOSIS — Z09 Encounter for follow-up examination after completed treatment for conditions other than malignant neoplasm: Secondary | ICD-10-CM

## 2024-07-02 DIAGNOSIS — M545 Low back pain, unspecified: Secondary | ICD-10-CM | POA: Diagnosis present

## 2024-07-02 DIAGNOSIS — M79604 Pain in right leg: Secondary | ICD-10-CM | POA: Diagnosis present

## 2024-07-02 DIAGNOSIS — G8929 Other chronic pain: Secondary | ICD-10-CM | POA: Insufficient documentation

## 2024-07-02 DIAGNOSIS — Z91013 Allergy to seafood: Secondary | ICD-10-CM | POA: Insufficient documentation

## 2024-07-02 MED ORDER — PREDNISONE 50 MG PO TABS: ORAL_TABLET | ORAL | 0 refills | Status: AC

## 2024-07-02 MED ORDER — DIPHENHYDRAMINE HCL 50 MG PO CAPS: ORAL_CAPSULE | ORAL | 0 refills | Status: AC

## 2024-07-02 NOTE — Patient Instructions (Addendum)
 ______________________________________________________________________    Procedure instructions  Stop blood-thinners  Do not eat or drink fluids (other than water) for 6 hours before your procedure  No water for 2 hours before your procedure  Take your blood pressure medicine with a sip of water  Arrive 30 minutes before your appointment  If sedation is planned, bring suitable driver. [place], [place], & public transportation are NOT APPROVED)  Carefully read the Preparing for your procedure detailed instructions  If you have questions call us  at [phone]  Procedure appointments are for procedures only.   NO medication refills or new problem evaluations will be done on procedure days.   Only the scheduled, pre-approved procedure and side will be done.   ______________________________________________________________________     ______________________________________________________________________    Preparing for your procedure  Appointments: If you think you may not be able to keep your appointment, call 24-48 hours in advance to cancel. We need time to make it available to others.  Procedure visits are for procedures only. During your procedure appointment there will be: NO Prescription Refills*. NO medication changes or discussions*. NO discussion of disability issues*. NO unrelated pain problem evaluations*. NO evaluations to order other pain procedures*. *These will be addressed at a separate and distinct evaluation encounter on the provider's evaluation schedule and not during procedure days.  Instructions: Food intake: Avoid eating anything solid for at least 8 hours prior to your procedure. Clear liquid intake: You may take clear liquids such as water up to 2 hours prior to your procedure. (No carbonated drinks. No soda.) Transportation: Unless otherwise stated by your physician, bring a driver. (Driver cannot be a Market Researcher, Pharmacist, Community, or any other form of public  transportation.) Morning Medicines: Except for blood thinners, take all of your other morning medications with a sip of water. Make sure to take your heart and blood pressure medicines. If your blood pressure's lower number is above 100, the case will be rescheduled. Blood thinners: Make sure to stop your blood thinners as instructed.  If you take a blood thinner, but were not instructed to stop it, call our office [phone] and ask to talk to a nurse. Not stopping a blood thinner prior to certain procedures could lead to serious complications. Diabetics on insulin : Notify the staff so that you can be scheduled 1st case in the morning. If your diabetes requires high dose insulin , take only  of your normal insulin  dose the morning of the procedure and notify the staff that you have done so. Preventing infections: Shower with an antibacterial soap the morning of your procedure.  Build-up your immune system: Take 1000 mg of Vitamin C with every meal (3 times a day) the day prior to your procedure. Antibiotics: Inform the nursing staff if you are taking any antibiotics or if you have any conditions that may require antibiotics prior to procedures. (Example: recent joint implants)   Pregnancy: If you are pregnant make sure to notify the nursing staff. Not doing so may result in injury to the fetus, including death.  Sickness: If you have a cold, fever, or any active infections, call and cancel or reschedule your procedure. Receiving steroids while having an infection may result in complications. Arrival: You must be in the facility at least 30 minutes prior to your scheduled procedure. Tardiness: Your scheduled time is also the cutoff time. If you do not arrive at least 15 minutes prior to your procedure, you will be rescheduled.  Children: Do not bring any children with  you. Make arrangements to keep them home. Dress appropriately: There is always a possibility that your clothing may get soiled. Avoid  long dresses. Valuables: Do not bring any jewelry or valuables.  Reasons to call and reschedule or cancel your procedure: (Following these recommendations will minimize the risk of a serious complication.) Surgeries: Avoid having procedures within 2 weeks of any surgery. (Avoid for 2 weeks before or after any surgery). Flu Shots: Avoid having procedures within 2 weeks of a flu shots or . (Avoid for 2 weeks before or after immunizations). Barium: Avoid having a procedure within 7-10 days after having had a radiological study involving the use of radiological contrast. (Myelograms, Barium swallow or enema study). Heart attacks: Avoid any elective procedures or surgeries for the initial 6 months after a Myocardial Infarction (Heart Attack). Blood thinners: It is imperative that you stop these medications before procedures. Let us  know if you if you take any blood thinner.  Infection: Avoid procedures during or within two weeks of an infection (including chest colds or gastrointestinal problems). Symptoms associated with infections include: Localized redness, fever, chills, night sweats or profuse sweating, burning sensation when voiding, cough, congestion, stuffiness, runny nose, sore throat, diarrhea, nausea, vomiting, cold or Flu symptoms, recent or current infections. It is specially important if the infection is over the area that we intend to treat. Heart and lung problems: Symptoms that may suggest an active cardiopulmonary problem include: cough, chest pain, breathing difficulties or shortness of breath, dizziness, ankle swelling, uncontrolled high or unusually low blood pressure, and/or palpitations. If you are experiencing any of these symptoms, cancel your procedure and contact your primary care physician for an evaluation.  Remember:  Regular Business hours are:  Monday to Thursday 8:00 AM to 4:00 PM  Provider's Schedule: Eric Como, MD:  Procedure days: Tuesday and Thursday 7:30  AM to 4:00 PM  Wallie Sherry, MD:  Procedure days: Monday and Wednesday 7:30 AM to 4:00 PM Last  Updated: 07/03/2023 ______________________________________________________________________     ______________________________________________________________________    General Risks and Possible Complications  Patient Responsibilities: It is important that you read this as it is part of your informed consent. It is our duty to inform you of the risks and possible complications associated with treatments offered to you. It is your responsibility as a patient to read this and to ask questions about anything that is not clear or that you believe was not covered in this document.  Patients Rights: You have the right to refuse treatment. You also have the right to change your mind, even after initially having agreed to have the treatment done. However, under this last option, if you wait until the last second to change your mind, you may be charged for the materials used up to that point.  Introduction: Medicine is not an visual merchandiser. Everything in Medicine, including the lack of treatment(s), carries the potential for danger, harm, or loss (which is by definition: Risk). In Medicine, a complication is a secondary problem, condition, or disease that can aggravate an already existing one. All treatments carry the risk of possible complications. The fact that a side effects or complications occurs, does not imply that the treatment was conducted incorrectly. It must be clearly understood that these can happen even when everything is done following the highest safety standards.  No treatment: You can choose not to proceed with the proposed treatment alternative. The PRO(s) would include: avoiding the risk of complications associated with the therapy. The CON(s) would include:  not getting any of the treatment benefits. These benefits fall under one of three categories: diagnostic; therapeutic; and/or  palliative. Diagnostic benefits include: getting information which can ultimately lead to improvement of the disease or symptom(s). Therapeutic benefits are those associated with the successful treatment of the disease. Finally, palliative benefits are those related to the decrease of the primary symptoms, without necessarily curing the condition (example: decreasing the pain from a flare-up of a chronic condition, such as incurable terminal cancer).  General Risks and Complications: These are associated to most interventional treatments. They can occur alone, or in combination. They fall under one of the following six (6) categories: no benefit or worsening of symptoms; bleeding; infection; nerve damage; allergic reactions; and/or death. No benefits or worsening of symptoms: In Medicine there are no guarantees, only probabilities. No healthcare provider can ever guarantee that a medical treatment will work, they can only state the probability that it may. Furthermore, there is always the possibility that the condition may worsen, either directly, or indirectly, as a consequence of the treatment. Bleeding: This is more common if the patient is taking a blood thinner, either prescription or over the counter (example: Goody Powders, Fish oil, Aspirin, Garlic, etc.), or if suffering a condition associated with impaired coagulation (example: Hemophilia, cirrhosis of the liver, low platelet counts, etc.). However, even if you do not have one on these, it can still happen. If you have any of these conditions, or take one of these drugs, make sure to notify your treating physician. Infection: This is more common in patients with a compromised immune system, either due to disease (example: diabetes, cancer, human immunodeficiency virus [HIV], etc.), or due to medications or treatments (example: therapies used to treat cancer and rheumatological diseases). However, even if you do not have one on these, it can still  happen. If you have any of these conditions, or take one of these drugs, make sure to notify your treating physician. Nerve Damage: This is more common when the treatment is an invasive one, but it can also happen with the use of medications, such as those used in the treatment of cancer. The damage can occur to small secondary nerves, or to large primary ones, such as those in the spinal cord and brain. This damage may be temporary or permanent and it may lead to impairments that can range from temporary numbness to permanent paralysis and/or brain death. Allergic Reactions: Any time a substance or material comes in contact with our body, there is the possibility of an allergic reaction. These can range from a mild skin rash (contact dermatitis) to a severe systemic reaction (anaphylactic reaction), which can result in death. Death: In general, any medical intervention can result in death, most of the time due to an unforeseen complication. ______________________________________________________________________      ______________________________________________________________________    Steroid injections  Common steroids for injections Triamcinolone : Used by many sports medicine physicians for large joint and bursal injections, often combined with a local anesthetic like lidocaine . A study focusing on coccydynia (tailbone pain) found triamcinolone  was more effective than betamethasone, suggesting it may also be preferable for other localized inflammation conditions. Methylprednisolone: A common alternative to triamcinolone  that is also a strong anti-inflammatory. It is available in different formulations, with the acetate suspension being the long-acting option for intra-articular injections. Dexamethasone: This is a non-particulate steroid, meaning it has a lower risk of tissue damage compared to particulate steroids like triamcinolone  and methylprednisolone. While less common for this specific  use,  it is an option for targeted injections.   Considerations for physicians Particulate vs. non-particulate steroids: Triamcinolone  and methylprednisolone are particulate, meaning they can clump together. Dexamethasone is non-particulate. Particulate steroids are often preferred for their longer-lasting effects but carry a theoretical higher risk for certain injections (though this is less of a concern in the costochondral joints). Combined injectate: Corticosteroids are typically mixed with a local anesthetic like lidocaine  to provide both immediate pain relief (from the anesthetic) and longer-term inflammation reduction (from the steroid). Imaging guidance: To ensure accurate placement of the needle and medication, physicians may use ultrasound or fluoroscopic guidance for the injection, especially in complex or refractory cases.   Patient guidance Before undergoing a steroid injection, discuss the options with your physician. They will determine the best steroid, dosage, and procedure for your specific case based on factors like: Severity of your condition History of response to other treatments Your overall health status Experience and preference of the physician  Last  Updated: 03/18/2024 ______________________________________________________________________     ______________________________________________________________________    Premedication for patients allergic to iodinated radiological contrast  Nonurgent oral premedication:  Glucocorticoid-preferred regimen:  Adult: Oral prednisone  50 mg at 13, 7, and 1 hour prior to contrast administration.  Glucocorticoid-alternate:  Adult: Oral methylprednisolone 32 mg at 12 and 2 hours prior to contrast administration.  H1 antihistamine:  Adult: Diphenhydramine (Benadryl) 50 mg, oral, 1 hour prior to contrast administration.    ______________________________________________________________________

## 2024-07-10 ENCOUNTER — Ambulatory Visit: Admitting: Pain Medicine

## 2024-07-10 ENCOUNTER — Encounter: Payer: Self-pay | Admitting: Pain Medicine

## 2024-07-10 ENCOUNTER — Ambulatory Visit
Admission: RE | Admit: 2024-07-10 | Discharge: 2024-07-10 | Disposition: A | Source: Ambulatory Visit | Attending: Pain Medicine | Admitting: Pain Medicine

## 2024-07-10 VITALS — BP 148/108 | HR 71 | Temp 98.1°F | Resp 16 | Ht 68.0 in | Wt 343.0 lb

## 2024-07-10 DIAGNOSIS — M5441 Lumbago with sciatica, right side: Secondary | ICD-10-CM | POA: Diagnosis not present

## 2024-07-10 DIAGNOSIS — M79604 Pain in right leg: Secondary | ICD-10-CM | POA: Diagnosis present

## 2024-07-10 DIAGNOSIS — G8929 Other chronic pain: Secondary | ICD-10-CM | POA: Insufficient documentation

## 2024-07-10 DIAGNOSIS — M5386 Other specified dorsopathies, lumbar region: Secondary | ICD-10-CM | POA: Insufficient documentation

## 2024-07-10 DIAGNOSIS — M5416 Radiculopathy, lumbar region: Secondary | ICD-10-CM | POA: Diagnosis present

## 2024-07-10 DIAGNOSIS — R937 Abnormal findings on diagnostic imaging of other parts of musculoskeletal system: Secondary | ICD-10-CM

## 2024-07-10 DIAGNOSIS — M4316 Spondylolisthesis, lumbar region: Secondary | ICD-10-CM | POA: Insufficient documentation

## 2024-07-10 DIAGNOSIS — Z91013 Allergy to seafood: Secondary | ICD-10-CM | POA: Insufficient documentation

## 2024-07-10 DIAGNOSIS — M545 Low back pain, unspecified: Secondary | ICD-10-CM | POA: Diagnosis present

## 2024-07-10 MED ORDER — SODIUM CHLORIDE 0.9% FLUSH
2.0000 mL | Freq: Once | INTRAVENOUS | Status: AC
  Administered 2024-07-10: 14:00:00 2 mL

## 2024-07-10 MED ORDER — ROPIVACAINE HCL 2 MG/ML IJ SOLN
2.0000 mL | Freq: Once | INTRAMUSCULAR | Status: AC
  Administered 2024-07-10: 14:00:00 2 mL via EPIDURAL
  Filled 2024-07-10: qty 20

## 2024-07-10 MED ORDER — PENTAFLUOROPROP-TETRAFLUOROETH EX AERO: INHALATION_SPRAY | Freq: Once | CUTANEOUS | Status: AC

## 2024-07-10 MED ORDER — TRIAMCINOLONE ACETONIDE 40 MG/ML IJ SUSP
40.0000 mg | Freq: Once | INTRAMUSCULAR | Status: AC
  Administered 2024-07-10: 14:00:00 40 mg
  Filled 2024-07-10: qty 1

## 2024-07-10 MED ORDER — LIDOCAINE HCL 2 % IJ SOLN
20.0000 mL | Freq: Once | INTRAMUSCULAR | Status: AC
  Administered 2024-07-10: 14:00:00 400 mg
  Filled 2024-07-10: qty 20

## 2024-07-10 MED ORDER — MIDAZOLAM HCL (PF) 2 MG/2ML IJ SOLN: 0.5000 mg | Freq: Once | INTRAMUSCULAR | Status: DC

## 2024-07-10 MED FILL — Sodium Chloride Preservative Free (PF) Inj 0.9%: INTRAMUSCULAR | Qty: 10 | Status: AC

## 2024-07-10 NOTE — Patient Instructions (Signed)
 ______________________________________________________________________    Post-Procedure Discharge Instructions  INSTRUCTIONS Apply ice:  Purpose: This will minimize any swelling and discomfort after procedure.  When: Day of procedure, as soon as you get home. How: Fill a plastic sandwich bag with crushed ice. Cover it with a small towel and apply to injection site. How long: (15 min on, 15 min off) Apply for 15 minutes then remove x 15 minutes.  Repeat sequence on day of procedure, until you go to bed. Apply heat:  Purpose: To treat any soreness and discomfort from the procedure. When: Starting the next day after the procedure. How: Apply heat to procedure site starting the day following the procedure. How long: May continue to repeat daily, until discomfort goes away. Food intake: Start with clear liquids (like water) and advance to regular food, as tolerated.  Physical activities: Keep activities to a minimum for the first 8 hours after the procedure. After that, then as tolerated. Driving: If you have received any sedation, be responsible and do not drive. You are not allowed to drive for 24 hours after having sedation. Blood thinner: (Applies only to those taking blood thinners) You may restart your blood thinner 6 hours after your procedure. Insulin: (Applies only to Diabetic patients taking insulin) As soon as you can eat, you may resume your normal dosing schedule. Infection prevention: Keep procedure site clean and dry. Shower daily and clean area with soap and water.  PAIN DIARY Post-procedure Pain Diary: Extremely important that this be done correctly and accurately. Recorded information will be used to determine the next step in treatment. For the purpose of accuracy, follow these rules: Evaluate only the area treated. Do not report or include pain from an untreated area. For the purpose of this evaluation, ignore all other areas of pain, except for the treated area. After your  procedure, avoid taking a long nap and attempting to complete the pain diary after you wake up. Instead, set your alarm clock to go off every hour, on the hour, for the initial 8 hours after the procedure. Document the duration of the numbing medicine, and the relief you are getting from it. Do not go to sleep and attempt to complete it later. It will not be accurate. If you received sedation, it is likely that you were given a medication that may cause amnesia. Because of this, completing the diary at a later time may cause the information to be inaccurate. This information is needed to plan your care. Follow-up appointment: Keep your post-procedure follow-up evaluation appointment after the procedure (usually 2 weeks for most procedures, 6 weeks for radiofrequencies). DO NOT FORGET to bring you pain diary with you.   EXPECT... (What should I expect to see with my procedure?) From numbing medicine (AKA: Local Anesthetics): Numbness or decrease in pain. You may also experience some weakness, which if present, could last for the duration of the local anesthetic. Onset: Full effect within 15 minutes of injected. Duration: It will depend on the type of local anesthetic used. On the average, 1 to 8 hours.  From steroids (Applies only if steroids were used): Decrease in swelling or inflammation. Once inflammation is improved, relief of the pain will follow. Onset of benefits: Depends on the amount of swelling present. The more swelling, the longer it will take for the benefits to be seen. In some cases, up to 10 days. Duration: Steroids will stay in the system x 2 weeks. Duration of benefits will depend on multiple posibilities including persistent irritating  factors. Side-effects: If present, they may typically last 2 weeks (the duration of the steroids). Frequent: Cramps (if they occur, drink Gatorade and take over-the-counter Magnesium 450-500 mg once to twice a day); water retention with temporary weight  gain; increases in blood sugar; decreased immune system response; increased appetite. Occasional: Facial flushing (red, warm cheeks); mood swings; menstrual changes. Uncommon: Long-term decrease or suppression of natural hormones; bone thinning. (These are more common with higher doses or more frequent use. This is why we prefer that our patients avoid having any injection therapies in other practices.)  Very Rare: Severe mood changes; psychosis; aseptic necrosis. From procedure: Some discomfort is to be expected once the numbing medicine wears off. This should be minimal if ice and heat are applied as instructed.  CALL IF... (When should I call?) You experience numbness and weakness that gets worse with time, as opposed to wearing off. New onset bowel or bladder incontinence. (Applies only to procedures done in the spine)  Emergency Numbers: Durning business hours (Monday - Thursday, 8:00 AM - 4:00 PM) (Friday, 9:00 AM - 12:00 Noon): [phone] After hours: [phone] NOTE: If you are having a problem and are unable connect with, or to talk to a provider, then go to your nearest urgent care or emergency department. If the problem is serious and urgent, please call 911. ______________________________________________________________________     ______________________________________________________________________    Steroid injections  Common steroids for injections Triamcinolone : Used by many sports medicine physicians for large joint and bursal injections, often combined with a local anesthetic like lidocaine . A study focusing on coccydynia (tailbone pain) found triamcinolone  was more effective than betamethasone, suggesting it may also be preferable for other localized inflammation conditions. Methylprednisolone: A common alternative to triamcinolone  that is also a strong anti-inflammatory. It is available in different formulations, with the acetate suspension being the long-acting  option for intra-articular injections. Dexamethasone : This is a non-particulate steroid, meaning it has a lower risk of tissue damage compared to particulate steroids like triamcinolone  and methylprednisolone. While less common for this specific use, it is an option for targeted injections.   Considerations for physicians Particulate vs. non-particulate steroids: Triamcinolone  and methylprednisolone are particulate, meaning they can clump together. Dexamethasone  is non-particulate. Particulate steroids are often preferred for their longer-lasting effects but carry a theoretical higher risk for certain injections (though this is less of a concern in the costochondral joints). Combined injectate: Corticosteroids are typically mixed with a local anesthetic like lidocaine  to provide both immediate pain relief (from the anesthetic) and longer-term inflammation reduction (from the steroid). Imaging guidance: To ensure accurate placement of the needle and medication, physicians may use ultrasound or fluoroscopic guidance for the injection, especially in complex or refractory cases.   Patient guidance Before undergoing a steroid injection, discuss the options with your physician. They will determine the best steroid, dosage, and procedure for your specific case based on factors like: Severity of your condition History of response to other treatments Your overall health status Experience and preference of the physician  Last  Updated: 03/18/2024 ______________________________________________________________________

## 2024-07-10 NOTE — Progress Notes (Signed)
 PROVIDER NOTE: Interpretation of information contained herein should be left to medically-trained personnel. Specific patient instructions are provided elsewhere under Patient Instructions section of medical record. This document was created in part using STT-dictation technology, any transcriptional errors that may result from this process are unintentional.  Patient: Mary Hardin Type: Established DOB: 1955/09/04 MRN: [id number] PCP: Vicci Duwaine SQUIBB, DO  Service: Procedure DOS: 07/10/2024 Setting: Ambulatory Location: Ambulatory outpatient facility Delivery: Face-to-face Provider: Eric DELENA Como, MD Specialty: Interventional Pain Management Specialty designation: 09 Location: Outpatient facility Ref. Prov.: Vicci Duwaine P, DO       Interventional Therapy   Type: Lumbar epidural steroid injection (LESI) (interlaminar) #2    Laterality: Right   Level:  L4-5 Level.  Imaging: Fluoroscopic guidance Spinal ([id number]) Anesthesia: Local anesthesia (1-2% Lidocaine ) Anxiolysis: None Declined                    Sedation: No Sedation                       DOS: 07/10/2024  Performed by: Eric DELENA Como, MD  Purpose: Diagnostic/Therapeutic Indications: Lumbar radicular pain of intraspinal etiology of more than 4 weeks that has failed to respond to conservative therapy and is severe enough to impact quality of life or function. 1. 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 Chronic lower extremity pain (Right)   3. Lumbar radiculitis (Right)   4. Chronic lumbar spine pain   5. Grade 1 Anterolisthesis of (L4/L5)   6. Low back pain of over 3 months duration   7. Low back pain radiating to right leg   8. Multifactorial low back pain   9. Decreased ROM of intervertebral discs of lumbar spine   10. Abnormal MRI, lumbar spine (06/04/2024)   11. History of allergy to shellfish    NAS-11 Pain score:   Pre-procedure: 3 /10   Post-procedure: 0-No pain/10       Position / Prep / Materials:  Position: Prone w/ head of the table raised (slight reverse trendelenburg) to facilitate breathing.  Prep solution: ChloraPrep (2% chlorhexidine gluconate and 70% isopropyl alcohol) Prep Area: Entire Posterior Lumbar Region from lower scapular tip down to mid buttocks area and from flank to flank. Materials:  Tray: Epidural tray Needle(s):  Type: Epidural needle (Tuohy) Gauge (G):  17 Length: Regular (3.5-in) Qty: 1  H&P (Pre-op Assessment):  Mary Hardin is a 68 y.o. (year old), female patient, seen today for interventional treatment. She  has a past surgical history that includes Cholecystectomy; Dilation and curettage of uterus; Varicose vein surgery; Eye surgery; Colonoscopy w/ biopsies and polypectomy; wisdom teeth extraction; Colonoscopy (N/A, 08/31/2017); and Esophagogastroduodenoscopy (N/A, 08/31/2017). Mary Hardin has a current medication list which includes the following prescription(s): acetaminophen, vitamin d3, diphenhydramine , docusate sodium, epinephrine , escitalopram , lorazepam , losartan , meloxicam , prednisone , pregabalin , and pregabalin . Her primarily concern today is the Back Pain (lower)  Initial Vital Signs:  Pulse/HCG Rate: 71ECG Heart Rate: 72 Temp: 98.1 F (36.7 C) Resp: 18 BP: 124/79 SpO2: 100 %  BMI: Estimated body mass index is 52.15 kg/m as calculated from the following:   Height as of this encounter: 5' 8 (1.727 m).   Weight as of this encounter: 343 lb (155.6 kg).  Risk Assessment: Allergies: Reviewed. She is allergic to shellfish allergy and shellfish protein-containing drug products.  Allergy Precautions: None required Coagulopathies: Reviewed. None identified.  Blood-thinner therapy: None at this time Active Infection(s): Reviewed. None identified. Ms.  Hardin is afebrile  Site Confirmation: Mary Hardin was asked to confirm the procedure and laterality before marking the site Procedure checklist: Completed Consent:  Before the procedure and under the influence of no sedative(s), amnesic(s), or anxiolytics, the patient was informed of the treatment options, risks and possible complications. To fulfill our ethical and legal obligations, as recommended by the American Medical Association's Code of Ethics, I have informed the patient of my clinical impression; the nature and purpose of the treatment or procedure; the risks, benefits, and possible complications of the intervention; the alternatives, including doing nothing; the risk(s) and benefit(s) of the alternative treatment(s) or procedure(s); and the risk(s) and benefit(s) of doing nothing. The patient was provided information about the general risks and possible complications associated with the procedure. These may include, but are not limited to: failure to achieve desired goals, infection, bleeding, organ or nerve damage, allergic reactions, paralysis, and death. In addition, the patient was informed of those risks and complications associated to Spine-related procedures, such as failure to decrease pain; infection (i.e.: Meningitis, epidural or intraspinal abscess); bleeding (i.e.: epidural hematoma, subarachnoid hemorrhage, or any other type of intraspinal or peri-dural bleeding); organ or nerve damage (i.e.: Any type of peripheral nerve, nerve root, or spinal cord injury) with subsequent damage to sensory, motor, and/or autonomic systems, resulting in permanent pain, numbness, and/or weakness of one or several areas of the body; allergic reactions; (i.e.: anaphylactic reaction); and/or death. Furthermore, the patient was informed of those risks and complications associated with the medications. These include, but are not limited to: allergic reactions (i.e.: anaphylactic or anaphylactoid reaction(s)); adrenal axis suppression; blood sugar elevation that in diabetics may result in ketoacidosis or comma; water retention that in patients with history of congestive heart  failure may result in shortness of breath, pulmonary edema, and decompensation with resultant heart failure; weight gain; swelling or edema; medication-induced neural toxicity; particulate matter embolism and blood vessel occlusion with resultant organ, and/or nervous system infarction; and/or aseptic necrosis of one or more joints. Finally, the patient was informed that Medicine is not an exact science; therefore, there is also the possibility of unforeseen or unpredictable risks and/or possible complications that may result in a catastrophic outcome. The patient indicated having understood very clearly. We have given the patient no guarantees and we have made no promises. Enough time was given to the patient to ask questions, all of which were answered to the patient's satisfaction. Ms. Bartelt has indicated that she wanted to continue with the procedure. Attestation: I, the ordering provider, attest that I have discussed with the patient the benefits, risks, side-effects, alternatives, likelihood of achieving goals, and potential problems during recovery for the procedure that I have provided informed consent. Date  Time: 07/10/2024  1:45 PM  Pre-Procedure Preparation:  Monitoring: As per clinic protocol. Respiration, ETCO2, SpO2, BP, heart rate and rhythm monitor placed and checked for adequate function Safety Precautions: Patient was assessed for positional comfort and pressure points before starting the procedure. Time-out: I initiated and conducted the Time-out before starting the procedure, as per protocol. The patient was asked to participate by confirming the accuracy of the Time Out information. Verification of the correct person, site, and procedure were performed and confirmed by me, the nursing staff, and the patient. Time-out conducted as per Joint Commission's Universal Protocol (UP.01.01.01). Time: 1430 Start Time: 1431 hrs.  Description/Narrative of Procedure:          Target:  Epidural space via interlaminar opening, initially targeting the lower  laminar border of the superior vertebral body. Region: Lumbar Approach: Percutaneous paravertebral  Rationale (medical necessity): procedure needed and proper for the diagnosis and/or treatment of the patient's medical symptoms and needs. Procedural Technique Safety Precautions: Aspiration looking for blood return was conducted prior to all injections. At no point did we inject any substances, as a needle was being advanced. No attempts were made at seeking any paresthesias. Safe injection practices and needle disposal techniques used. Medications properly checked for expiration dates. SDV (single dose vial) medications used. Description of the Procedure: Protocol guidelines were followed. The procedure needle was introduced through the skin, ipsilateral to the reported pain, and advanced to the target area. Bone was contacted and the needle walked caudad, until the lamina was cleared. The epidural space was identified using loss-of-resistance technique with 2-3 ml of PF-NaCl (0.9% NSS), in a 5cc LOR glass syringe.  Vitals:   07/10/24 1343 07/10/24 1430 07/10/24 1440  BP: 124/79 (!) 142/105 (!) 148/108  Pulse: 71    Resp: 18 15 16   Temp: 98.1 F (36.7 C)    TempSrc: Temporal    SpO2: 100% 99% 99%  Weight: (!) 343 lb (155.6 kg)    Height: 5' 8 (1.727 m)      Start Time: 1431 hrs. End Time:   hrs.  Imaging Guidance (Spinal):          Type of Imaging Technique: Fluoroscopy Guidance (Spinal) Indication(s): Fluoroscopy guidance for needle placement to enhance accuracy in procedures requiring precise needle localization for targeted delivery of medication in or near specific anatomical locations not easily accessible without such real-time imaging assistance. Exposure Time: Please see nurses notes. Contrast: Before injecting any contrast, we confirmed that the patient did not have an allergy to iodine, shellfish, or  radiological contrast. Once satisfactory needle placement was completed at the desired level, radiological contrast was injected. Contrast injected under live fluoroscopy. No contrast complications. See chart for type and volume of contrast used. Fluoroscopic Guidance: I was personally present during the use of fluoroscopy. Tunnel Vision Technique used to obtain the best possible view of the target area. Parallax error corrected before commencing the procedure. Direction-depth-direction technique used to introduce the needle under continuous pulsed fluoroscopy. Once target was reached, antero-posterior, oblique, and lateral fluoroscopic projection used confirm needle placement in all planes. Images permanently stored in EMR. Interpretation: I personally interpreted the imaging intraoperatively. Adequate needle placement confirmed in multiple planes. Appropriate spread of contrast into desired area was observed. No evidence of afferent or efferent intravascular uptake. No intrathecal or subarachnoid spread observed. Permanent images saved into the patient's record.  Antibiotic Prophylaxis:   Anti-infectives (From admission, onward)    None      Indication(s): None identified  Post-operative Assessment:  Post-procedure Vital Signs:  Pulse/HCG Rate: 7173 Temp: 98.1 F (36.7 C) Resp: 16 BP: (!) 148/108 SpO2: 99 %  EBL: None  Complications: No immediate post-treatment complications observed by team, or reported by patient.  Note: The patient tolerated the entire procedure well. A repeat set of vitals were taken after the procedure and the patient was kept under observation following institutional policy, for this type of procedure. Post-procedural neurological assessment was performed, showing return to baseline, prior to discharge. The patient was provided with post-procedure discharge instructions, including a section on how to identify potential problems. Should any problems arise  concerning this procedure, the patient was given instructions to immediately contact us , at any time, without hesitation. In any case, we plan to contact the  patient by telephone for a follow-up status report regarding this interventional procedure.  Comments:  No additional relevant information.  Plan of Care (POC)  Orders:  Orders Placed This Encounter  Procedures   Lumbar Epidural Injection    Scheduling Instructions:     Procedure: Interlaminar LESI L4-5     Laterality: Right     Sedation: Patient's choice     Date: 07/10/2024    Where will this procedure be performed?:   [facility]   [facility] C-ARM 1-60 MIN NO REPORT    Intraoperative interpretation by procedural physician at [facility].    Standing Status:   Standing    Number of Occurrences:   1    Reason for exam::   Assistance in needle guidance and placement for procedures requiring needle placement in or near specific anatomical locations not easily accessible without such assistance.   Informed Consent Details: Physician/Practitioner Attestation; Transcribe to consent form and obtain patient signature    Note: Always confirm laterality of pain with Ms. Seelye, before procedure. Transcribe to consent form and obtain patient signature.    Physician/Practitioner attestation of informed consent for procedure/surgical case:   I, the physician/practitioner, attest that I have discussed with the patient the benefits, risks, side effects, alternatives, likelihood of achieving goals and potential problems during recovery for the procedure that I have provided informed consent.    Procedure:   Lumbar epidural steroid injection under fluoroscopic guidance    Physician/Practitioner performing the procedure:   Lurline Caver A. Tanya, MD    Indication/Reason:   Low back and/or lower extremity pain secondary to lumbar radiculitis   Provide equipment / supplies at bedside    Procedural tray: Epidural Tray  (Disposable  single use) Skin infiltration needle: Regular 1.5-in, 25-G, (x1) Block needle size: Regular standard Catheter: No catheter required    Standing Status:   Standing    Number of Occurrences:   1    Specify:   Epidural Tray   Miscellanous precautions    NOTE: Although It is true that patients can have allergies to shellfish and that shellfish contain iodine, most shellfish  allergies are due to two protein allergens present in the shellfish: tropomyosins and parvalbumin. Not all patients with shellfish allergies are allergic to iodine. However, as a precaution, avoid using iodine containing products.    Standing Status:   Standing    Number of Occurrences:   1     Opioid Analgesic: None MME/day: 0 mg/day    Medications ordered for procedure: Meds ordered this encounter  Medications   lidocaine  (XYLOCAINE ) 2 % (with pres) injection 400 mg   pentafluoroprop-tetrafluoroeth (GEBAUERS) aerosol   DISCONTD: midazolam  PF (VERSED ) injection 0.5-2 mg    Make sure Flumazenil is available in the pyxis when using this medication. If oversedation occurs, administer 0.2 mg IV over 15 sec. If after 45 sec no response, administer 0.2 mg again over 1 min; may repeat at 1 min intervals; not to exceed 4 doses (1 mg)   sodium chloride  flush (NS) 0.9 % injection 2 mL   ropivacaine  (PF) 2 mg/mL (0.2%) (NAROPIN ) injection 2 mL   triamcinolone  acetonide (KENALOG -40) injection 40 mg   Medications administered: We administered lidocaine , pentafluoroprop-tetrafluoroeth, sodium chloride  flush, ropivacaine  (PF) 2 mg/mL (0.2%), and triamcinolone  acetonide.  See the medical record for exact dosing, route, and time of administration.    Interventional Therapies  Risk Factors  Considerations  Medical Comorbidities:  MO (BMI>50) (350 lbs)  BA  GERD  HTN  OSA  Anxiety/Depression  Hx. Breast Cancer     Planned  Pending:   Diagnostic right L4-5 LESI #1 (06/12/2024)    Under consideration:    Diagnostic right L4-5 LESI #1    Completed: (Analgesic benefit)1  None at this time   Therapeutic  Palliative (PRN) options:   None established   Completed by other providers:   Diagnostic/therapeutic bilateral IA steroid/gel knee injections by [facility] every 3 months   1(Analgesic benefit): Expressed in percentage (%). (Local anesthetic[LA] +/- sedation  L.A.Local Anesthetic  Steroid benefit  Ongoing benefit)      Follow-up plan:   Return in about 2 weeks (around 07/24/2024) for (Face2F), (PPE).     Recent Visits Date Type Provider Dept  07/02/24 Office Visit Tanya Glisson, MD [facility]  06/12/24 Procedure visit Tanya Glisson, MD [facility]  06/09/24 Office Visit Tanya Glisson, MD [facility]  06/02/24 Office Visit Tanya Glisson, MD [facility]  Showing recent visits within past 90 days and meeting all other requirements Today's Visits Date Type Provider Dept  07/10/24 Procedure visit Tanya Glisson, MD [facility]  Showing today's visits and meeting all other requirements Future Appointments Date Type Provider Dept  08/13/24 Appointment Tanya Glisson, MD [facility]  Showing future appointments within next 90 days and meeting all other requirements   Disposition: Discharge home  Discharge (Date  Time): 07/10/2024; 1444 hrs.   Primary Care Physician: Vicci Duwaine SQUIBB, DO Location: [facility] Note by: Glisson DELENA Tanya, MD (TTS technology used. I apologize for any typographical errors that were not detected and corrected.) Date: 07/10/2024; Time: 4:01 PM  Disclaimer:  Medicine is not an visual merchandiser. The only guarantee in medicine is that nothing is guaranteed. It is important to note that the decision to proceed with this intervention was based on the information collected from the patient. The Data and conclusions were drawn from the  patient's questionnaire, the interview, and the physical examination. Because the information was provided in large part by the patient, it cannot be guaranteed that it has not been purposely or unconsciously manipulated. Every effort has been made to obtain as much relevant data as possible for this evaluation. It is important to note that the conclusions that lead to this procedure are derived in large part from the available data. Always take into account that the treatment will also be dependent on availability of resources and existing treatment guidelines, considered by other Pain Management Practitioners as being common knowledge and practice, at the time of the intervention. For Medico-Legal purposes, it is also important to point out that variation in procedural techniques and pharmacological choices are the acceptable norm. The indications, contraindications, technique, and results of the above procedure should only be interpreted and judged by a Board-Certified Interventional Pain Specialist with extensive familiarity and expertise in the same exact procedure and technique.

## 2024-07-11 ENCOUNTER — Telehealth: Payer: Self-pay

## 2024-07-11 NOTE — Telephone Encounter (Signed)
 SABRA

## 2024-07-11 NOTE — Telephone Encounter (Signed)
 Called pp. No answer, left message t ncall if needed.

## 2024-07-22 ENCOUNTER — Encounter: Admitting: Family Medicine

## 2024-08-01 ENCOUNTER — Encounter: Admitting: Family Medicine

## 2024-08-04 ENCOUNTER — Ambulatory Visit: Admitting: Family Medicine

## 2024-08-13 ENCOUNTER — Ambulatory Visit: Admitting: Pain Medicine

## 2024-08-13 ENCOUNTER — Other Ambulatory Visit: Payer: Self-pay | Admitting: Family Medicine

## 2024-08-13 NOTE — Telephone Encounter (Signed)
 Rx 06/26/24 #90- too soon Requested Prescriptions  Pending Prescriptions Disp Refills   losartan  (COZAAR ) 25 MG tablet [Pharmacy Med Name: LOSARTAN  25MG  TABLETS] 90 tablet 0    Sig: TAKE 1 TABLET(25 MG) BY MOUTH DAILY     Cardiovascular:  Angiotensin Receptor Blockers Failed - 08/13/2024  3:52 PM      Failed - Last BP in normal range    BP Readings from Last 1 Encounters:  07/10/24 (!) 148/108         Passed - Cr in normal range and within 180 days    Creatinine, Ser  Date Value Ref Range Status  04/17/2024 0.83 0.57 - 1.00 mg/dL Final         Passed - K in normal range and within 180 days    Potassium  Date Value Ref Range Status  04/17/2024 4.9 3.5 - 5.2 mmol/L Final         Passed - Patient is not pregnant      Passed - Valid encounter within last 6 months    Recent Outpatient Visits           2 months ago Right sided sciatica   [facility] Bessie, Megan P, DO   4 months ago Right sided sciatica   [place] [facility] [place], Megan P, DO   4 months ago Right sided sciatica   [place] [facility] [place], Megan P, DO   6 months ago Primary hypertension   [place] [facility] [place], [place] P, DO   10 months ago OSA (obstructive sleep apnea)    [facility], Megan P, DO

## 2024-08-21 NOTE — Progress Notes (Signed)
 PROVIDER NOTE: Interpretation of information contained herein should be left to medically-trained personnel. Specific patient instructions are provided elsewhere under Patient Instructions section of medical record. This document was created in part using AI and STT-dictation technology, any transcriptional errors that may result from this process are unintentional.  Patient: Mary Hardin  Service: E/M   PCP: Vicci Duwaine SQUIBB, DO  DOB: 03-Oct-1955  DOS: 08/25/2024  Provider: Eric DELENA Como, MD  MRN: [id number]  Delivery: Virtual Visit  Specialty: Interventional Pain Management  Type: Established Patient  Setting: Ambulatory outpatient facility  Specialty designation: 09  Referring Prov.: Vicci Duwaine SQUIBB, DO  Location: Remote location       Virtual Encounter - Pain Management PROVIDER NOTE: Information contained herein reflects review and annotations entered in association with encounter. Interpretation of such information and data should be left to medically-trained personnel. Information provided to patient can be located elsewhere in the medical record under Patient Instructions. Document created using STT-dictation technology, any transcriptional errors that may result from process are unintentional.    Contact & Pharmacy Preferred: [phone] Home: [phone] (home) Mobile: [phone] (mobile) E-mail: [email or web address]  [facility] [phone] - [address] [place], [place] - [address] AT [facility] [address] [place] [address] Phone: [phone] Fax: [phone]  [facility] [address] [place] - [address] [facility] [address] & [facility] [address] [place] [address] Phone: [phone] Fax: [phone]   Pre-screening  Ms. Depree offered in-person vs virtual encounter. She indicated preferring virtual for this encounter.   Reason COVID-19*  Social distancing based on  CDC and AMA recommendations.   I contacted Mary Hardin on 08/25/2024 via telephone.      I clearly identified myself as Eric DELENA Como, MD. I verified that I was speaking with the correct person using two identifiers (Name: ALIANI CACCAVALE, and date of birth: 11/14/1955).  Consent I sought verbal advanced consent from Mary Hardin for virtual visit interactions. I informed Ms. Fake of possible security and privacy concerns, risks, and limitations associated with providing not-in-person medical evaluation and management services. I also informed Ms. Stamour of the availability of in-person appointments. Finally, I informed her that there would be a charge for the virtual visit and that she could be  personally, fully or partially, financially responsible for it. Ms. Kissick expressed understanding and agreed to proceed.   Historic Elements   Ms. PAULENE TAYAG is a 69 y.o. year old, female patient evaluated today after our last contact on 07/10/2024. Ms. Rollo  has a past medical history of Anemia, Anxiety, Arthritis, Asthma, Depression, Family history of adverse reaction to anesthesia, Family history of malignant neoplasm of breast, Fatty liver, GERD (gastroesophageal reflux disease), Headache, Hernia of abdominal wall, Hypertension, Joint pain, Multiple food allergies, PONV (postoperative nausea and vomiting), and Sleep apnea. She also  has a past surgical history that includes Cholecystectomy; Dilation and curettage of uterus; Varicose vein surgery; Eye surgery; Colonoscopy w/ biopsies and polypectomy; wisdom teeth extraction; Colonoscopy (N/A, 08/31/2017); and Esophagogastroduodenoscopy (N/A, 08/31/2017). Ms. Makepeace has a current medication list which includes the following prescription(s): acetaminophen, vitamin d3, diphenhydramine , docusate sodium, epinephrine , escitalopram , lorazepam , losartan , meloxicam , prednisone , pregabalin , and pregabalin . She  reports that she has never  smoked. She has never used smokeless tobacco. She reports current alcohol use. She reports that she does not use drugs. Ms.  Longfield is allergic to shellfish allergy and shellfish protein-containing drug products.  BMI: Estimated body mass index is 52.15 kg/m as calculated from the following:   Height as of 07/10/24: 5' 8 (1.727 m).   Weight as of 07/10/24: 343 lb (155.6 kg). Last encounter: 07/02/2024. Last procedure: 07/10/2024.  HPI  Today, she is being contacted for a post-procedure assessment.  Post-Procedure Evaluation   Type: Lumbar epidural steroid injection (LESI) (interlaminar) #2    Laterality: Right   Level:  L4-5 Level.  Imaging: Fluoroscopic guidance Spinal ([id number]) Anesthesia: Local anesthesia (1-2% Lidocaine ) Anxiolysis: None Declined                    Sedation: No Sedation                       DOS: 07/10/2024  Performed by: Eric DELENA Como, MD  Purpose: Diagnostic/Therapeutic Indications: Lumbar radicular pain of intraspinal etiology of more than 4 weeks that has failed to respond to conservative therapy and is severe enough to impact quality of life or function. 1. 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 Chronic lower extremity pain (Right)   3. Lumbar radiculitis (Right)   4. Chronic lumbar spine pain   5. Grade 1 Anterolisthesis of (L4/L5)   6. Low back pain of over 3 months duration   7. Low back pain radiating to right leg   8. Multifactorial low back pain   9. Decreased ROM of intervertebral discs of lumbar spine   10. Abnormal MRI, lumbar spine (06/04/2024)   11. History of allergy to shellfish    NAS-11 Pain score:   Pre-procedure: 3 /10   Post-procedure: 0-No pain/10     Effectiveness:  Initial hour after procedure: 100 %. Subsequent 4-6 hours post-procedure: 100 %. Analgesia past initial 6 hours: 50 %. Ongoing improvement:  Analgesic: The patient indicates having attained 100% relief of pain for the duration of the  local anesthetic followed by an ongoing 50% improvement.  However, she refers still having a nagging pain in the area of the right buttocks.  Today the patient was asked to do a modified Patrick maneuver and although she did indicate having some discomfort in the hip joint area, it did not exactly reproduce the patient that she has been experiencing in the mornings.  Review of the x-rays of both of her hips did not show any significant arthropathy however review of the patient's recent lumbar MRI does reveal multilevel facet joint arthropathy likely to be responsible for the patient's symptoms.  She does have a grade 1 anterolisthesis of L4 over L5 and unfortunately she also is morbidly obese with a current weight of 343 pounds, likely to be responsible for the patient's Lumbar osteoarthritis.  She does indicate having discomfort going down the lateral aspect of her right leg to the calf area and for this reason we will plan on repeating the lumbar epidural steroid injection.  This will be her third.  The patient was informed that should the pain continue we will have to review what is going on with her lower back and it is very likely that we may have to do a diagnostic lumbar facet block.  Unfortunately, because of her current weight, it would be difficult to follow-up with radiofrequency ablation.  However if we can confirm that in fact her pain is coming from the facet joints, we can relay this information to the patient and perhaps encouraged  her to bring her BMI down.  If her BMI was too get to 30 or less, this is very likely to help the patient with her current symptoms. Function: Somewhat improved ROM: Somewhat improved Interpretation: At this point, the patient is experiencing low back pain and some pain down the right leg which we are trying to assess whether it is referred from the lumbar facets or if this patient is a radiculitis secondary to lumbar nerve root irritation.  Pharmacotherapy Assessment   Opioid Analgesic: None MME/day: 0 mg/day   Monitoring: [place] PMP: PDMP not reviewed this encounter.       Pharmacotherapy: No side-effects or adverse reactions reported. Compliance: No problems identified. Effectiveness: Clinically acceptable. Plan: Refer to POC.  UDS:  Summary  Date Value Ref Range Status  06/02/2024 FINAL  Final    Comment:    ==================================================================== Compliance Drug Analysis, Ur ==================================================================== Test                             Result       Flag       Units  Drug Present and Declared for Prescription Verification   Pregabalin                      PRESENT      EXPECTED   Citalopram                     PRESENT      EXPECTED   Desmethylcitalopram            PRESENT      EXPECTED    Desmethylcitalopram is an expected metabolite of citalopram or the    enantiomeric form, escitalopram .    Acetaminophen                  PRESENT      EXPECTED  Drug Absent but Declared for Prescription Verification   Lorazepam                       Not Detected UNEXPECTED ng/mg creat   Baclofen                        Not Detected UNEXPECTED ==================================================================== Test                      Result    Flag   Units      Ref Range   Creatinine              79               mg/dL      >=79 ==================================================================== Declared Medications:  The flagging and interpretation on this report are based on the  following declared medications.  Unexpected results may arise from  inaccuracies in the declared medications.   **Note: The testing scope of this panel includes these medications:   Baclofen  (Lioresal )  Escitalopram  (Lexapro )  Lorazepam  (Ativan )  Pregabalin  (Lyrica )   **Note: The testing scope of this panel does not include small to  moderate amounts of these reported medications:   Acetaminophen  (Tylenol)   **Note: The testing scope of this panel does not include the  following reported medications:   Docusate (Colace)  Epinephrine  (EpiPen )  Losartan  (Cozaar )  Meloxicam  (Mobic )  Valacyclovir (Valtrex)  Vitamin D3 ==================================================================== For clinical consultation, please call [phone]. ====================================================================  No results found for: MABLE OYSTER, [id number]  Laboratory Chemistry Profile   Renal Lab Results  Component Value Date   BUN 16 04/17/2024   CREATININE 0.83 04/17/2024   BCR 19 04/17/2024   GFRAA 105 06/24/2020   GFRNONAA 91 06/24/2020    Hepatic Lab Results  Component Value Date   AST 16 04/17/2024   ALT 13 04/17/2024   ALBUMIN 4.2 04/17/2024   ALKPHOS 100 04/17/2024    Electrolytes Lab Results  Component Value Date   NA 141 04/17/2024   K 4.9 04/17/2024   CL 102 04/17/2024   CALCIUM 9.5 04/17/2024   MG 2.3 06/02/2024    Bone Lab Results  Component Value Date   25OHVITD1 85 06/02/2024   25OHVITD2 1.0 06/02/2024   25OHVITD3 84 06/02/2024    Inflammation (CRP: Acute Phase) (ESR: Chronic Phase) Lab Results  Component Value Date   CRP 27 (H) 06/02/2024   ESRSEDRATE 83 (H) 06/02/2024         Note: Above Lab results reviewed.  Imaging  DG PAIN CLINIC C-ARM 1-60 MIN NO REPORT Fluoro was used, but no Radiologist interpretation will be provided.  Please refer to NOTES tab for provider progress note.  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 Diagnoses of Chronic lower extremity pain (Right), Lumbar radiculitis (Right), and Postop check were also pertinent to this visit.  Plan of Care  Problem-specific:  No problem-specific Assessment & Plan notes found for this encounter.  Ms. ANANI GU has a current medication list which includes the following long-term  medication(s): diphenhydramine , escitalopram , losartan , pregabalin , and pregabalin .  Pharmacotherapy (Medications Ordered): No orders of the defined types were placed in this encounter.  Orders:  Orders Placed This Encounter  Procedures   Lumbar Epidural Injection    Standing Status:   Future    Expiration Date:   11/22/2024    Scheduling Instructions:     Procedure: Interlaminar Lumbar Epidural Steroid injection (LESI)  L4-5     Laterality: Right-sided     Procedural Analgesia/Anxiolysis: Patient's choice     Timeframe: As soon as schedule allows.    Where will this procedure be performed?:   [facility]   Nursing Instructions:    Please complete this patient's postprocedure evaluation.    Scheduling Instructions:     Please complete this patient's postprocedure evaluation.   Follow-up plan:   Return for [facility]): (R) L4-5 LESI #3.      Interventional Therapies  Risk Factors  Considerations  Medical Comorbidities:  MO (BMI>50) (350 lbs)  BA  GERD  HTN  OSA  Anxiety/Depression  Hx. Breast Cancer     Planned  Pending:   Diagnostic right L4-5 LESI #3    Under consideration:   Diagnostic right L4-5 LESI #3  Diagnostic bilateral lumbar facet MBB #1 (not a candidate for RFA until she can bring her BMI closer to 35 kg/m)    Completed: (Analgesic benefit)1  Diagnostic right L4-5 LESI x2 (07/10/2024) (100/100/50/50)    Therapeutic  Palliative (PRN) options:   None established   Completed by other providers:   Diagnostic/therapeutic bilateral IA steroid/gel knee injections by [facility] every 3 months   1(Analgesic benefit): Expressed in percentage (%). (Local anesthetic[LA] +/- sedation  L.A.Local Anesthetic  Steroid benefit  Ongoing benefit)      Recent Visits Date Type Provider Dept  07/10/24 Procedure visit Tanya Glisson, MD [facility]  07/02/24 Office Visit Tanya Glisson, MD  [facility]  06/12/24 Procedure visit  Tanya Glisson, MD [facility]  06/09/24 Office Visit Tanya Glisson, MD [facility]  06/02/24 Office Visit Tanya Glisson, MD [facility]  Showing recent visits within past 90 days and meeting all other requirements Today's Visits Date Type Provider Dept  08/25/24 Office Visit Tanya Glisson, MD [facility]  Showing today's visits and meeting all other requirements Future Appointments No visits were found meeting these conditions. Showing future appointments within next 90 days and meeting all other requirements  I discussed the assessment and treatment plan with the patient. The patient was provided an opportunity to ask questions and all were answered. The patient agreed with the plan and demonstrated an understanding of the instructions.  Patient advised to call back or seek an in-person evaluation if the symptoms or condition worsens.  Duration of encounter: 11 minutes.  Note by: Glisson DELENA Tanya, MD Date: 08/25/2024; Time: 11:55 AM

## 2024-08-22 ENCOUNTER — Encounter: Payer: Self-pay | Admitting: Pain Medicine

## 2024-08-25 ENCOUNTER — Ambulatory Visit: Admitting: Pain Medicine

## 2024-08-25 DIAGNOSIS — G8929 Other chronic pain: Secondary | ICD-10-CM | POA: Diagnosis not present

## 2024-08-25 DIAGNOSIS — M5441 Lumbago with sciatica, right side: Secondary | ICD-10-CM | POA: Diagnosis not present

## 2024-08-25 DIAGNOSIS — M79604 Pain in right leg: Secondary | ICD-10-CM

## 2024-08-25 DIAGNOSIS — M5416 Radiculopathy, lumbar region: Secondary | ICD-10-CM

## 2024-08-25 DIAGNOSIS — Z09 Encounter for follow-up examination after completed treatment for conditions other than malignant neoplasm: Secondary | ICD-10-CM | POA: Diagnosis not present

## 2024-08-25 NOTE — Patient Instructions (Signed)
 " ______________________________________________________________________    Procedure instructions  Stop blood-thinners  Do not eat or drink fluids (other than water ) for 8 hours before your procedure  No water  for 2 hours before your procedure  Take your blood pressure medicine with a sip of water   Arrive 30 minutes before your appointment  If sedation is planned, bring suitable driver. [place], [place], & public transportation are NOT APPROVED)  Carefully read the Preparing for your procedure detailed instructions  If you have questions call us  at [phone]  Procedure appointments are for procedures only.   NO medication refills or new problem evaluations will be done on procedure days.   Only the scheduled, pre-approved procedure and side will be done.   ______________________________________________________________________     ______________________________________________________________________    Preparing for your procedure  Appointments: If you think you may not be able to keep your appointment, call 24-48 hours in advance to cancel. We need time to make it available to others.  Procedure visits are for procedures only. During your procedure appointment there will be: NO Prescription Refills*. NO medication changes or discussions*. NO discussion of disability issues*. NO unrelated pain problem evaluations*. NO evaluations to order other pain procedures*. *These will be addressed at a separate and distinct evaluation encounter on the provider's evaluation schedule and not during procedure days.  Instructions: Food intake: Avoid eating anything solid for at least 8 hours prior to your procedure. Clear liquid intake: You may take clear liquids such as water  up to 2 hours prior to your procedure. (No carbonated drinks. No soda.) Transportation: Unless otherwise stated by your physician, bring a driver. (Driver cannot be a [employer]earcher, Pharmacist, Community, or any other form of public  transportation.) Morning Medicines: Except for blood thinners, take all of your other morning medications with a sip of water . Make sure to take your heart and blood pressure medicines. If your blood pressure's lower number is above 100, the case will be rescheduled. Blood thinners: Make sure to stop your blood thinners as instructed.  If you take a blood thinner, but were not instructed to stop it, call our office [phone] and ask to talk to a nurse. Not stopping a blood thinner prior to certain procedures could lead to serious complications. Diabetics on insulin: Notify the staff so that you can be scheduled 1st case in the morning. If your diabetes requires high dose insulin, take only  of your normal insulin dose the morning of the procedure and notify the staff that you have done so. Preventing infections: Shower with an antibacterial soap the morning of your procedure.  Build-up your immune system: Take 1000 mg of Vitamin C with every meal (3 times a day) the day prior to your procedure. Antibiotics: Inform the nursing staff if you are taking any antibiotics or if you have any conditions that may require antibiotics prior to procedures. (Example: recent joint implants)   Pregnancy: If you are pregnant make sure to notify the nursing staff. Not doing so may result in injury to the fetus, including death.  Sickness: If you have a cold, fever, or any active infections, call and cancel or reschedule your procedure. Receiving steroids while having an infection may result in complications. Arrival: You must be in the facility at least 30 minutes prior to your scheduled procedure. Tardiness: Your scheduled time is also the cutoff time. If you do not arrive at least 15 minutes prior to your procedure, you will be rescheduled.  Children: Do not bring any children  with you. Make arrangements to keep them home. Dress appropriately: There is always a possibility that your clothing may get soiled. Avoid  long dresses. Valuables: Do not bring any jewelry or valuables.  Reasons to call and reschedule or cancel your procedure: (Following these recommendations will minimize the risk of a serious complication.) Surgeries: Avoid having procedures within 2 weeks of any surgery. (Avoid for 2 weeks before or after any surgery). Flu Shots: Avoid having procedures within 2 weeks of a flu shots or . (Avoid for 2 weeks before or after immunizations). Barium: Avoid having a procedure within 7-10 days after having had a radiological study involving the use of radiological contrast. (Myelograms, Barium swallow or enema study). Heart attacks: Avoid any elective procedures or surgeries for the initial 6 months after a Myocardial Infarction (Heart Attack). Blood thinners: It is imperative that you stop these medications before procedures. Let us  know if you if you take any blood thinner.  Infection: Avoid procedures during or within two weeks of an infection (including chest colds or gastrointestinal problems). Symptoms associated with infections include: Localized redness, fever, chills, night sweats or profuse sweating, burning sensation when voiding, cough, congestion, stuffiness, runny nose, sore throat, diarrhea, nausea, vomiting, cold or Flu symptoms, recent or current infections. It is specially important if the infection is over the area that we intend to treat. Heart and lung problems: Symptoms that may suggest an active cardiopulmonary problem include: cough, chest pain, breathing difficulties or shortness of breath, dizziness, ankle swelling, uncontrolled high or unusually low blood pressure, and/or palpitations. If you are experiencing any of these symptoms, cancel your procedure and contact your primary care physician for an evaluation.  Remember:  Regular Business hours are:  Monday to Thursday 8:00 AM to 4:00 PM  Provider's Schedule: Eric Como, MD:  Procedure days: Tuesday and Thursday 7:30  AM to 4:00 PM  Wallie Sherry, MD:  Procedure days: Monday and Wednesday 7:30 AM to 4:00 PM Last  Updated: 07/03/2023 ______________________________________________________________________     ______________________________________________________________________    General Risks and Possible Complications  Patient Responsibilities: It is important that you read this as it is part of your informed consent. It is our duty to inform you of the risks and possible complications associated with treatments offered to you. It is your responsibility as a patient to read this and to ask questions about anything that is not clear or that you believe was not covered in this document.  Patients Rights: You have the right to refuse treatment. You also have the right to change your mind, even after initially having agreed to have the treatment done. However, under this last option, if you wait until the last second to change your mind, you may be charged for the materials used up to that point.  Introduction: Medicine is not an visual merchandiser. Everything in Medicine, including the lack of treatment(s), carries the potential for danger, harm, or loss (which is by definition: Risk). In Medicine, a complication is a secondary problem, condition, or disease that can aggravate an already existing one. All treatments carry the risk of possible complications. The fact that a side effects or complications occurs, does not imply that the treatment was conducted incorrectly. It must be clearly understood that these can happen even when everything is done following the highest safety standards.  No treatment: You can choose not to proceed with the proposed treatment alternative. The PRO(s) would include: avoiding the risk of complications associated with the therapy. The CON(s) would  include: not getting any of the treatment benefits. These benefits fall under one of three categories: diagnostic; therapeutic; and/or  palliative. Diagnostic benefits include: getting information which can ultimately lead to improvement of the disease or symptom(s). Therapeutic benefits are those associated with the successful treatment of the disease. Finally, palliative benefits are those related to the decrease of the primary symptoms, without necessarily curing the condition (example: decreasing the pain from a flare-up of a chronic condition, such as incurable terminal cancer).  General Risks and Complications: These are associated to most interventional treatments. They can occur alone, or in combination. They fall under one of the following six (6) categories: no benefit or worsening of symptoms; bleeding; infection; nerve damage; allergic reactions; and/or death. No benefits or worsening of symptoms: In Medicine there are no guarantees, only probabilities. No healthcare provider can ever guarantee that a medical treatment will work, they can only state the probability that it may. Furthermore, there is always the possibility that the condition may worsen, either directly, or indirectly, as a consequence of the treatment. Bleeding: This is more common if the patient is taking a blood thinner, either prescription or over the counter (example: Goody Powders, Fish oil, Aspirin , Garlic, etc.), or if suffering a condition associated with impaired coagulation (example: Hemophilia, cirrhosis of the liver, low platelet counts, etc.). However, even if you do not have one on these, it can still happen. If you have any of these conditions, or take one of these drugs, make sure to notify your treating physician. Infection: This is more common in patients with a compromised immune system, either due to disease (example: diabetes, cancer, human immunodeficiency virus [HIV], etc.), or due to medications or treatments (example: therapies used to treat cancer and rheumatological diseases). However, even if you do not have one on these, it can still  happen. If you have any of these conditions, or take one of these drugs, make sure to notify your treating physician. Nerve Damage: This is more common when the treatment is an invasive one, but it can also happen with the use of medications, such as those used in the treatment of cancer. The damage can occur to small secondary nerves, or to large primary ones, such as those in the spinal cord and brain. This damage may be temporary or permanent and it may lead to impairments that can range from temporary numbness to permanent paralysis and/or brain death. Allergic Reactions: Any time a substance or material comes in contact with our body, there is the possibility of an allergic reaction. These can range from a mild skin rash (contact dermatitis) to a severe systemic reaction (anaphylactic reaction), which can result in death. Death: In general, any medical intervention can result in death, most of the time due to an unforeseen complication. ______________________________________________________________________      ______________________________________________________________________    Steroid injections  Common steroids for injections Triamcinolone : Used by many sports medicine physicians for large joint and bursal injections, often combined with a local anesthetic like lidocaine . A study focusing on coccydynia (tailbone pain) found triamcinolone  was more effective than betamethasone, suggesting it may also be preferable for other localized inflammation conditions. Methylprednisolone: A common alternative to triamcinolone  that is also a strong anti-inflammatory. It is available in different formulations, with the acetate suspension being the long-acting option for intra-articular injections. Dexamethasone : This is a non-particulate steroid, meaning it has a lower risk of tissue damage compared to particulate steroids like triamcinolone  and methylprednisolone. While less common for this specific  use, it is an option for targeted injections.   Considerations for physicians Particulate vs. non-particulate steroids: Triamcinolone  and methylprednisolone are particulate, meaning they can clump together. Dexamethasone  is non-particulate. Particulate steroids are often preferred for their longer-lasting effects but carry a theoretical higher risk for certain injections (though this is less of a concern in the costochondral joints). Combined injectate: Corticosteroids are typically mixed with a local anesthetic like lidocaine  to provide both immediate pain relief (from the anesthetic) and longer-term inflammation reduction (from the steroid). Imaging guidance: To ensure accurate placement of the needle and medication, physicians may use ultrasound or fluoroscopic guidance for the injection, especially in complex or refractory cases.   Patient guidance Before undergoing a steroid injection, discuss the options with your physician. They will determine the best steroid, dosage, and procedure for your specific case based on factors like: Severity of your condition History of response to other treatments Your overall health status Experience and preference of the physician  Last  Updated: 03/18/2024 ______________________________________________________________________     "

## 2024-09-02 ENCOUNTER — Ambulatory Visit: Admitting: Pain Medicine

## 2024-09-05 ENCOUNTER — Encounter: Admitting: Family Medicine
